# Patient Record
Sex: Female | Born: 1945
Health system: Southern US, Community
[De-identification: ages and names within clinical notes are randomized; demographics above are authoritative.]

## PROBLEM LIST (undated history)

## (undated) DIAGNOSIS — Z923 Personal history of irradiation: Secondary | ICD-10-CM

## (undated) DIAGNOSIS — H409 Unspecified glaucoma: Secondary | ICD-10-CM

## (undated) DIAGNOSIS — G709 Myoneural disorder, unspecified: Secondary | ICD-10-CM

## (undated) DIAGNOSIS — M8580 Other specified disorders of bone density and structure, unspecified site: Secondary | ICD-10-CM

## (undated) DIAGNOSIS — C50919 Malignant neoplasm of unspecified site of unspecified female breast: Secondary | ICD-10-CM

## (undated) DIAGNOSIS — Z9221 Personal history of antineoplastic chemotherapy: Secondary | ICD-10-CM

## (undated) DIAGNOSIS — T7840XA Allergy, unspecified, initial encounter: Secondary | ICD-10-CM

## (undated) DIAGNOSIS — C50411 Malignant neoplasm of upper-outer quadrant of right female breast: Secondary | ICD-10-CM

## (undated) DIAGNOSIS — B019 Varicella without complication: Secondary | ICD-10-CM

## (undated) DIAGNOSIS — R51 Headache: Secondary | ICD-10-CM

## (undated) DIAGNOSIS — I420 Dilated cardiomyopathy: Secondary | ICD-10-CM

## (undated) DIAGNOSIS — I10 Essential (primary) hypertension: Secondary | ICD-10-CM

## (undated) DIAGNOSIS — F419 Anxiety disorder, unspecified: Secondary | ICD-10-CM

## (undated) DIAGNOSIS — L719 Rosacea, unspecified: Secondary | ICD-10-CM

## (undated) DIAGNOSIS — I509 Heart failure, unspecified: Secondary | ICD-10-CM

## (undated) HISTORY — PX: APPENDECTOMY: SHX54

## (undated) HISTORY — DX: Dilated cardiomyopathy: I42.0

## (undated) HISTORY — DX: Rosacea, unspecified: L71.9

## (undated) HISTORY — DX: Malignant neoplasm of upper-outer quadrant of right female breast: C50.411

## (undated) HISTORY — PX: OTHER SURGICAL HISTORY: SHX169

## (undated) HISTORY — DX: Anxiety disorder, unspecified: F41.9

## (undated) HISTORY — DX: Headache: R51

## (undated) HISTORY — DX: Allergy, unspecified, initial encounter: T78.40XA

## (undated) HISTORY — DX: Unspecified glaucoma: H40.9

## (undated) HISTORY — DX: Varicella without complication: B01.9

## (undated) HISTORY — DX: Essential (primary) hypertension: I10

## (undated) HISTORY — PX: EYE SURGERY: SHX253

## (undated) HISTORY — DX: Heart failure, unspecified: I50.9

---

## 1998-06-25 ENCOUNTER — Other Ambulatory Visit: Admission: RE | Admit: 1998-06-25 | Discharge: 1998-06-25 | Payer: Self-pay | Admitting: *Deleted

## 2000-08-23 ENCOUNTER — Other Ambulatory Visit: Admission: RE | Admit: 2000-08-23 | Discharge: 2000-08-23 | Payer: Self-pay | Admitting: *Deleted

## 2004-07-11 ENCOUNTER — Ambulatory Visit: Payer: Self-pay | Admitting: Family Medicine

## 2004-07-14 ENCOUNTER — Encounter: Admission: RE | Admit: 2004-07-14 | Discharge: 2004-07-14 | Payer: Self-pay | Admitting: Obstetrics and Gynecology

## 2005-01-09 ENCOUNTER — Ambulatory Visit: Payer: Self-pay | Admitting: Family Medicine

## 2005-04-19 ENCOUNTER — Ambulatory Visit: Payer: Self-pay | Admitting: Family Medicine

## 2005-08-25 ENCOUNTER — Ambulatory Visit: Payer: Self-pay | Admitting: Family Medicine

## 2005-08-29 ENCOUNTER — Ambulatory Visit: Payer: Self-pay | Admitting: Family Medicine

## 2005-09-29 ENCOUNTER — Ambulatory Visit: Payer: Self-pay | Admitting: Family Medicine

## 2005-11-22 ENCOUNTER — Ambulatory Visit: Payer: Self-pay | Admitting: Family Medicine

## 2006-02-08 ENCOUNTER — Ambulatory Visit: Payer: Self-pay | Admitting: Family Medicine

## 2006-03-15 ENCOUNTER — Ambulatory Visit: Payer: Self-pay | Admitting: Family Medicine

## 2006-08-31 ENCOUNTER — Ambulatory Visit: Payer: Self-pay | Admitting: Family Medicine

## 2006-08-31 LAB — CONVERTED CEMR LAB
Basophils Relative: 0.4 % (ref 0.0–1.0)
Bilirubin, Direct: 0.1 mg/dL (ref 0.0–0.3)
Chloride: 108 meq/L (ref 96–112)
Creatinine, Ser: 0.8 mg/dL (ref 0.4–1.2)
Direct LDL: 136.5 mg/dL
Eosinophils Relative: 2.4 % (ref 0.0–5.0)
Glucose, Bld: 75 mg/dL (ref 70–99)
HCT: 43.1 % (ref 36.0–46.0)
Hemoglobin: 14.6 g/dL (ref 12.0–15.0)
Lymphocytes Relative: 33.1 % (ref 12.0–46.0)
MCHC: 33.8 g/dL (ref 30.0–36.0)
Monocytes Absolute: 0.7 10*3/uL (ref 0.2–0.7)
Neutro Abs: 4.1 10*3/uL (ref 1.4–7.7)
Neutrophils Relative %: 55 % (ref 43.0–77.0)
Platelets: 288 10*3/uL (ref 150–400)
Potassium: 3.9 meq/L (ref 3.5–5.1)
Total CHOL/HDL Ratio: 3.8
Triglycerides: 113 mg/dL (ref 0–149)
VLDL: 23 mg/dL (ref 0–40)

## 2006-09-20 ENCOUNTER — Encounter: Admission: RE | Admit: 2006-09-20 | Discharge: 2006-09-20 | Payer: Self-pay | Admitting: Obstetrics and Gynecology

## 2006-11-13 ENCOUNTER — Ambulatory Visit: Payer: Self-pay | Admitting: Internal Medicine

## 2006-11-20 ENCOUNTER — Telehealth: Payer: Self-pay | Admitting: *Deleted

## 2007-10-16 ENCOUNTER — Ambulatory Visit: Payer: Self-pay | Admitting: Family Medicine

## 2007-10-16 LAB — CONVERTED CEMR LAB
Bilirubin Urine: NEGATIVE
Glucose, Urine, Semiquant: NEGATIVE
Ketones, urine, test strip: NEGATIVE
pH: 5.5

## 2007-10-18 LAB — CONVERTED CEMR LAB
BUN: 10 mg/dL (ref 6–23)
Basophils Absolute: 0.1 10*3/uL (ref 0.0–0.1)
Cholesterol: 225 mg/dL (ref 0–200)
Direct LDL: 142.2 mg/dL
Eosinophils Absolute: 0.2 10*3/uL (ref 0.0–0.7)
GFR calc Af Amer: 94 mL/min
HDL: 62.3 mg/dL (ref >39.0)
MCHC: 34.9 g/dL (ref 30.0–36.0)
Monocytes Absolute: 0.6 10*3/uL (ref 0.1–1.0)
Neutro Abs: 2.9 10*3/uL (ref 1.4–7.7)
Neutrophils Relative %: 49.8 % (ref 43.0–77.0)
Potassium: 4.9 meq/L (ref 3.5–5.1)
RBC: 4.59 M/uL (ref 3.87–5.11)
RDW: 12.5 % (ref 11.5–14.6)
TSH: 2.46 microintl units/mL (ref 0.35–5.50)
Total CHOL/HDL Ratio: 3.6
VLDL: 23 mg/dL (ref 0–40)

## 2007-10-23 ENCOUNTER — Ambulatory Visit: Payer: Self-pay | Admitting: Family Medicine

## 2007-10-23 DIAGNOSIS — R519 Headache, unspecified: Secondary | ICD-10-CM | POA: Insufficient documentation

## 2007-10-23 DIAGNOSIS — M810 Age-related osteoporosis without current pathological fracture: Secondary | ICD-10-CM | POA: Insufficient documentation

## 2007-10-23 DIAGNOSIS — J309 Allergic rhinitis, unspecified: Secondary | ICD-10-CM

## 2007-10-23 DIAGNOSIS — I10 Essential (primary) hypertension: Secondary | ICD-10-CM | POA: Insufficient documentation

## 2007-10-23 DIAGNOSIS — F411 Generalized anxiety disorder: Secondary | ICD-10-CM

## 2007-10-23 DIAGNOSIS — R51 Headache: Secondary | ICD-10-CM

## 2007-10-23 DIAGNOSIS — L719 Rosacea, unspecified: Secondary | ICD-10-CM

## 2009-02-04 ENCOUNTER — Ambulatory Visit: Payer: Self-pay | Admitting: Family Medicine

## 2009-02-04 LAB — CONVERTED CEMR LAB
Blood in Urine, dipstick: NEGATIVE
Ketones, urine, test strip: NEGATIVE
Nitrite: NEGATIVE
Protein, U semiquant: NEGATIVE
Urobilinogen, UA: 0.2

## 2009-02-09 LAB — CONVERTED CEMR LAB
ALT: 17 units/L (ref 0–35)
CO2: 28 meq/L (ref 19–32)
Cholesterol: 195 mg/dL (ref 0–200)
Creatinine, Ser: 0.8 mg/dL (ref 0.4–1.2)
Eosinophils Absolute: 0.2 10*3/uL (ref 0.0–0.7)
Eosinophils Relative: 2.5 % (ref 0.0–5.0)
Glucose, Bld: 90 mg/dL (ref 70–99)
Monocytes Relative: 9.8 % (ref 3.0–12.0)
Neutro Abs: 3.4 10*3/uL (ref 1.4–7.7)
Platelets: 235 10*3/uL (ref 150.0–400.0)
Potassium: 4.3 meq/L (ref 3.5–5.1)
RBC: 4.76 M/uL (ref 3.87–5.11)
Sodium: 136 meq/L (ref 135–145)
TSH: 1.77 microintl units/mL (ref 0.35–5.50)
Total CHOL/HDL Ratio: 3
Total Protein: 7.4 g/dL (ref 6.0–8.3)
VLDL: 18.6 mg/dL (ref 0.0–40.0)
WBC: 6.6 10*3/uL (ref 4.5–10.5)

## 2009-02-16 ENCOUNTER — Ambulatory Visit: Payer: Self-pay | Admitting: Family Medicine

## 2010-02-14 ENCOUNTER — Ambulatory Visit: Payer: Self-pay | Admitting: Family Medicine

## 2010-02-14 LAB — CONVERTED CEMR LAB
Bilirubin Urine: NEGATIVE
Blood in Urine, dipstick: NEGATIVE
Glucose, Urine, Semiquant: NEGATIVE
Ketones, urine, test strip: NEGATIVE
Protein, U semiquant: NEGATIVE
Specific Gravity, Urine: 1.015
Urobilinogen, UA: 0.2
WBC Urine, dipstick: NEGATIVE
pH: 5.5

## 2010-02-17 LAB — CONVERTED CEMR LAB
ALT: 22 units/L (ref 0–35)
AST: 24 units/L (ref 0–37)
Alkaline Phosphatase: 61 units/L (ref 39–117)
Basophils Relative: 0.3 % (ref 0.0–3.0)
CO2: 28 meq/L (ref 19–32)
Calcium: 10 mg/dL (ref 8.4–10.5)
Chloride: 101 meq/L (ref 96–112)
Creatinine, Ser: 0.9 mg/dL (ref 0.4–1.2)
Glucose, Bld: 88 mg/dL (ref 70–99)
Lymphocytes Relative: 33.3 % (ref 12.0–46.0)
Lymphs Abs: 2.8 10*3/uL (ref 0.7–4.0)
Monocytes Relative: 8.3 % (ref 3.0–12.0)
Neutro Abs: 4.7 10*3/uL (ref 1.4–7.7)
Sodium: 138 meq/L (ref 135–145)
TSH: 2.3 microintl units/mL (ref 0.35–5.50)
Total CHOL/HDL Ratio: 3
Total Protein: 7.4 g/dL (ref 6.0–8.3)

## 2010-02-21 ENCOUNTER — Ambulatory Visit: Payer: Self-pay | Admitting: Family Medicine

## 2010-02-21 ENCOUNTER — Encounter: Payer: Self-pay | Admitting: Family Medicine

## 2010-02-22 LAB — CONVERTED CEMR LAB
CO2: 28 meq/L (ref 19–32)
Chloride: 96 meq/L (ref 96–112)
Potassium: 4.1 meq/L (ref 3.5–5.1)

## 2010-05-31 NOTE — Assessment & Plan Note (Signed)
Summary: CPX/CJR   Vital Signs:  Patient profile:   65 year old female Weight:      193 pounds BMI:     31.99 O2 Sat:      98 % Temp:     99.1 degrees F Pulse rate:   111 / minute BP sitting:   130 / 86  (left arm) Cuff size:   large  Vitals Entered By: Pura Spice, RN (February 21, 2010 2:53 PM) CC: cpx no pap    History of Present Illness: 65 yr old female for a cpx. She feels fine but would like for me to treat her acne rosacea. She would like touse generics where possible. She is working out at a gym 5 days a week doing weights, cardio, and yoga.   Allergies (verified): No Known Drug Allergies  Past History:  Past Medical History: Reviewed history from 02/16/2009 and no changes required. Allergic rhinitis Headache Hypertension Chickenpox Rosacea, sees Dr. Terri Piedra sees Dr. Henderson Cloud for gyn exams Osteoporosis per DEXA 2006, improved by DEXA in 2008, managed by Dr. Demaris Callander  Past Surgical History: Reviewed history from 10/23/2007 and no changes required.  Appendectomy Cataract extractions, bilateral, per Dr. Jettie Pagan colonoscopy 09-21-05 per Dr. Kinnie Scales, repeat in 3 yrs  Past History:  Care Management: Gynecology: Dr Margarite Gouge   Family History: Reviewed history from 10/23/2007 and no changes required. Family History of Arthritis Family History of CAD Female 1st degree relative <50 Family History Hypertension Family History of Stroke M 1st degree relative <50 Macular degeneration  Social History: Reviewed history from 10/23/2007 and no changes required. Married Former Smoker Alcohol use-yes Drug use-no  Review of Systems  The patient denies anorexia, fever, weight loss, weight gain, vision loss, decreased hearing, hoarseness, chest pain, syncope, dyspnea on exertion, peripheral edema, prolonged cough, headaches, hemoptysis, abdominal pain, melena, hematochezia, severe indigestion/heartburn, hematuria, incontinence, genital sores, muscle weakness,  suspicious skin lesions, transient blindness, difficulty walking, depression, unusual weight change, abnormal bleeding, enlarged lymph nodes, angioedema, breast masses, and testicular masses.         Flu Vaccine Consent Questions     Do you have a history of severe allergic reactions to this vaccine? no    Any prior history of allergic reactions to egg and/or gelatin? no    Do you have a sensitivity to the preservative Thimersol? no    Do you have a past history of Guillan-Barre Syndrome? no    Do you currently have an acute febrile illness? no    Have you ever had a severe reaction to latex? no    Vaccine information given and explained to patient? yes    Are you currently pregnant? no    Lot Number:[id number]   Exp Date:10/29/2010   Site Given  Left Deltoid IM Pura Spice, RN  February 21, 2010 3:10 PM   Physical Exam  General:  overweight-appearing.   Head:  Normocephalic and atraumatic without obvious abnormalities. No apparent alopecia or balding. Eyes:  No corneal or conjunctival inflammation noted. EOMI. Perrla. Funduscopic exam benign, without hemorrhages, exudates or papilledema. Vision grossly normal. Ears:  External ear exam shows no significant lesions or deformities.  Otoscopic examination reveals clear canals, tympanic membranes are intact bilaterally without bulging, retraction, inflammation or discharge. Hearing is grossly normal bilaterally. Nose:  External nasal examination shows no deformity or inflammation. Nasal mucosa are pink and moist without lesions or exudates. Mouth:  Oral mucosa and oropharynx without lesions or exudates.  Teeth in good repair.  Neck:  No deformities, masses, or tenderness noted. Chest Wall:  No deformities, masses, or tenderness noted. Lungs:  Normal respiratory effort, chest expands symmetrically. Lungs are clear to auscultation, no crackles or wheezes. Heart:  Normal rate and regular rhythm. S1 and S2 normal without gallop, murmur, click, rub  or other extra sounds. EKG normal Abdomen:  Bowel sounds positive,abdomen soft and non-tender without masses, organomegaly or hernias noted. Msk:  No deformity or scoliosis noted of thoracic or lumbar spine.   Pulses:  R and L carotid,radial,femoral,dorsalis pedis and posterior tibial pulses are full and equal bilaterally Extremities:  No clubbing, cyanosis, edema, or deformity noted with normal full range of motion of all joints.   Neurologic:  No cranial nerve deficits noted. Station and gait are normal. Plantar reflexes are down-going bilaterally. DTRs are symmetrical throughout. Sensory, motor and coordinative functions appear intact. Skin:  Intact without suspicious lesions or rashes Cervical Nodes:  No lymphadenopathy noted Axillary Nodes:  No palpable lymphadenopathy Inguinal Nodes:  No significant adenopathy Psych:  Cognition and judgment appear intact. Alert and cooperative with normal attention span and concentration. No apparent delusions, illusions, hallucinations   Impression & Recommendations:  Problem # 1:  PHYSICAL EXAMINATION (ICD-V70.0)  Orders: EKG w/ Interpretation (93000)  Complete Medication List: 1)  Flonase 50 Mcg/act Susp (Fluticasone propionate) .... 2 sprays each nostril once daily 2)  Zyrtec Allergy 10 Mg Tabs (Cetirizine hcl) .Marland Kitchen.. 1 once daily prn 3)  Calcium Carbonate-vitamin D 600-400 Mg-unit Tabs (Calcium carbonate-vitamin d) .Marland Kitchen.. 1 by mouth once daily 4)  Vitamin C 500 Mg Tabs (Ascorbic acid) .Marland Kitchen.. 1 by mouth once daily 5)  Fish Oil Oil (Fish oil) .Marland Kitchen.. 1 by mouth once daily 6)  Fosamax 70 Mg Tabs (Alendronate sodium) .Marland Kitchen.. 1 by mouth every week 7)  Reglan 10 Mg Tabs (Metoclopramide hcl) .Marland Kitchen.. 1 every 6 hours as needed hiccups 8)  Klonopin 2 Mg Tabs (Clonazepam) .... Two times a day prn  anxiety 9)  Flexeril 10 Mg Tabs (Cyclobenzaprine hcl) .... Three times a day as needed spasm 10)  Lisinopril 10 Mg Tabs (Lisinopril) .... Once daily 11)  Metronidazole 0.75  % Gel (Metronidazole) .... Apply two times a day  Other Orders: Admin 1st Vaccine (10272) Flu Vaccine 40yrs + (53664) TLB-BMP (Basic Metabolic Panel-BMET) (80048-METABOL) Venipuncture (40347) Specimen Handling (42595)  Patient Instructions: 1)  Please schedule a follow-up appointment in 6 months .  2)  It is important that you exercise reguarly at least 20 minutes 5 times a week. If you develop chest pain, have severe difficulty breathing, or feel very tired, stop exercising immediately and seek medical attention.  3)  You need to lose weight. Consider a lower calorie diet and regular exercise.  Prescriptions: LISINOPRIL 10 MG TABS (LISINOPRIL) once daily  #90 x 3   Entered and Authorized by:   Nelwyn Salisbury MD   Signed by:   Nelwyn Salisbury MD on 02/21/2010   Method used:   Print then Give to Patient   RxID:   [id number] FLEXERIL 10 MG TABS (CYCLOBENZAPRINE HCL) three times a day as needed spasm  #270 x 1   Entered and Authorized by:   Nelwyn Salisbury MD   Signed by:   Nelwyn Salisbury MD on 02/21/2010   Method used:   Print then Give to Patient   RxID:   [id number] KLONOPIN 2 MG TABS (CLONAZEPAM) two times a day prn  anxiety  #180 x 1   Entered and  Authorized by:   Nelwyn Salisbury MD   Signed by:   Nelwyn Salisbury MD on 02/21/2010   Method used:   Print then Give to Patient   RxID:   [id number] REGLAN 10 MG  TABS (METOCLOPRAMIDE HCL) 1 every 6 hours as needed hiccups  #360 x 1   Entered and Authorized by:   Nelwyn Salisbury MD   Signed by:   Nelwyn Salisbury MD on 02/21/2010   Method used:   Print then Give to Patient   RxID:   [id number] FLONASE 50 MCG/ACT  SUSP (FLUTICASONE PROPIONATE) 2 sprays each nostril once daily  #90 x 3   Entered and Authorized by:   Nelwyn Salisbury MD   Signed by:   Nelwyn Salisbury MD on 02/21/2010   Method used:   Print then Give to Patient   RxID:   [id number] METRONIDAZOLE 0.75 % GEL (METRONIDAZOLE) apply two times a day  #90 x 3    Entered and Authorized by:   Nelwyn Salisbury MD   Signed by:   Nelwyn Salisbury MD on 02/21/2010   Method used:   Print then Give to Patient   RxID:   [id number]    Orders Added: 1)  Admin 1st Vaccine [90471] 2)  Flu Vaccine 31yrs + [35009] 3)  TLB-BMP (Basic Metabolic Panel-BMET) [80048-METABOL] 4)  Venipuncture [38182] 5)  Est. Patient 40-64 years [99396] 6)  EKG w/ Interpretation [93000] 7)  Specimen Handling [99000]

## 2010-07-04 ENCOUNTER — Telehealth: Payer: Self-pay | Admitting: *Deleted

## 2010-07-04 NOTE — Telephone Encounter (Signed)
Call in Klor-con 10 mEq daily, #30 with 11 rf. Also I suggest she drink a glass of tonic water with quinine daily

## 2010-07-04 NOTE — Telephone Encounter (Signed)
Pt is having severe leg and foot cramps q hs, and wants to take Magnesium and Potassium. Would like Dr. Clent Ridges to write a prescription for the Potassium.  She is on Vitamin D and Calcium.

## 2010-07-06 ENCOUNTER — Telehealth: Payer: Self-pay | Admitting: Family Medicine

## 2010-07-06 NOTE — Telephone Encounter (Signed)
Pt has been exercising a lot this year she is experiencing a lot of leg cramps sometime leg cramps 5 to 10 times a night. Pt would like a rx for potassium to go along with calcium and magnesium. [place] also [place] [phone]

## 2010-07-06 NOTE — Telephone Encounter (Signed)
I gave an order for her to start on potasium pills 2 days ago. See a phone note from 07-04-10

## 2010-07-06 NOTE — Telephone Encounter (Signed)
Tried to call.  No answering machine.

## 2010-07-07 MED ORDER — POTASSIUM CHLORIDE CRYS ER 20 MEQ PO TBCR: 20.0000 meq | EXTENDED_RELEASE_TABLET | Freq: Every day | ORAL | Status: AC

## 2010-07-07 NOTE — Telephone Encounter (Signed)
patient  Is aware and rx sent 

## 2010-08-25 ENCOUNTER — Ambulatory Visit (INDEPENDENT_AMBULATORY_CARE_PROVIDER_SITE_OTHER): Payer: BC Managed Care – PPO | Admitting: Family Medicine

## 2010-08-25 ENCOUNTER — Encounter: Payer: Self-pay | Admitting: Family Medicine

## 2010-08-25 VITALS — BP 122/76 | HR 88 | Temp 98.0°F | Resp 16 | Wt 183.0 lb

## 2010-08-25 DIAGNOSIS — J329 Chronic sinusitis, unspecified: Secondary | ICD-10-CM

## 2010-08-25 MED ORDER — METHYLPREDNISOLONE ACETATE 80 MG/ML IJ SUSP
80.0000 mg | Freq: Once | INTRAMUSCULAR | Status: AC
  Administered 2010-08-25: 120 mg via INTRAMUSCULAR

## 2010-08-25 MED ORDER — AZITHROMYCIN 250 MG PO TABS: 250.0000 mg | ORAL_TABLET | Freq: Every day | ORAL | Status: AC

## 2010-08-25 MED ORDER — HYDROCODONE-HOMATROPINE 5-1.5 MG/5ML PO SYRP: 5.0000 mL | ORAL_SOLUTION | ORAL | Status: AC | PRN

## 2010-08-25 NOTE — Progress Notes (Signed)
  Subjective:    Patient ID: Stephanie Olsen, female    DOB: [birth year], 65 y.o.   MRN: [id number]  HPI Here for 3 weeks of sinus pressure, PND, HA, ST, and a dry cough. No fever. On Zyrtec and Mucinex D.    Review of Systems  Constitutional: Negative.   HENT: Positive for congestion, postnasal drip and sinus pressure.   Eyes: Negative.   Respiratory: Positive for cough.        Objective:   Physical Exam  Constitutional: She appears well-developed and well-nourished.  HENT:  Right Ear: External ear normal.  Left Ear: External ear normal.  Nose: Nose normal.  Mouth/Throat: Oropharynx is clear and moist. No oropharyngeal exudate.  Eyes: Conjunctivae are normal. Pupils are equal, round, and reactive to light.  Neck: No thyromegaly present.  Pulmonary/Chest: Effort normal and breath sounds normal.  Lymphadenopathy:    She has no cervical adenopathy.          Assessment & Plan:  Rest, fluids

## 2010-09-16 NOTE — Assessment & Plan Note (Signed)
[facility]                            [facility] OFFICE NOTE   NAME:Stephanie Olsen, Stephanie Olsen                    MRN:          [id number]  DATE:08/31/2006                            DOB:          October 20, 1945    This is a 66 year old woman here for a nongynecological physical  examination.  She feels fine and has no complaints at all.  She  continues to see Dr. Henderson Cloud for Gynecology care and for care of her  osteoporosis.  Of note she had a colonoscopy in May of 2007 which  revealed only a benign rectal polyp.  A 3 year follow up was recommended  at that time by Dr. Kinnie Scales.  Otherwise her blood pressure has been  stable since we adjusted her medications at our last visit.  Further  details of her past medical history, family history, social history,  habits, etc., refer to our last physical exam dated Aug 29, 2005.   ALLERGIES:  NONE.   CURRENT MEDICATIONS:  1. Calcium and vitamin D daily.  2. Aspirin 81 mg daily.  3. Metrogel daily per Dr. Terri Piedra.  4. Fish oil daily.  5. Fosamax plus D 70 mg once a week.  6. Claritin 10 mg daily.  7. Diovan 160 mg daily.  8. Flonase sprays daily.  9. Skelaxin 800 mg q.i.d. as needed.  10.Aleve as needed.   OBJECTIVE:  Height 5 foot 5 inches, weight 202, blood pressure 132/84,  pulse 76 and regular.  GENERAL:  She remains overweight.  SKIN:  Clear.  EARS:  Clear.  EYES:  Clear.  OROPHARYNX:  Clear.  NECK:  Supple without lymphadenopathy or masses.  LUNGS:  Clear.  CARDIAC:  Rate and rhythm are regular without gallops, murmurs, or rubs.  Distal pulses are full.  EKG is within normal limits.  ABDOMEN:  Soft, normal bowel sounds, nontender, no masses.  EXTREMITIES:  No clubbing, cyanosis, or edema.  NEUROLOGIC:  Grossly intact.   ASSESSMENT/PLAN:  1. Complete physical.  She is fasting so we will get the usual      laboratories, also recommended increasing exercise.  2. Hypertension, stable.  3. Allergies,  stable.  4. Anxiety, stable.  5. Osteoporosis per Dr. Henderson Cloud.     Tera Mater. Clent Ridges, MD  Electronically Signed    SAF/MedQ  DD: 08/31/2006  DT: 08/31/2006  Job #: [id number]

## 2010-11-30 ENCOUNTER — Other Ambulatory Visit: Payer: Self-pay | Admitting: Family Medicine

## 2010-11-30 MED ORDER — CLONAZEPAM 2 MG PO TABS: 2.0000 mg | ORAL_TABLET | Freq: Two times a day (BID) | ORAL | Status: DC | PRN

## 2010-11-30 NOTE — Telephone Encounter (Signed)
done

## 2010-11-30 NOTE — Telephone Encounter (Signed)
Refill request from [facility]. Pt last here on 08/25/10 and script last filled on 02/21/10.

## 2010-11-30 NOTE — Telephone Encounter (Signed)
Call in #60 with 5 rf 

## 2011-10-26 ENCOUNTER — Encounter: Payer: Self-pay | Admitting: Family Medicine

## 2011-10-26 ENCOUNTER — Ambulatory Visit (INDEPENDENT_AMBULATORY_CARE_PROVIDER_SITE_OTHER): Payer: BC Managed Care – PPO | Admitting: Family Medicine

## 2011-10-26 VITALS — BP 120/76 | HR 83 | Temp 99.6°F | Wt 198.0 lb

## 2011-10-26 DIAGNOSIS — J329 Chronic sinusitis, unspecified: Secondary | ICD-10-CM

## 2011-10-26 MED ORDER — CYCLOBENZAPRINE HCL 10 MG PO TABS: 10.0000 mg | ORAL_TABLET | Freq: Three times a day (TID) | ORAL | Status: DC | PRN

## 2011-10-26 MED ORDER — CLONAZEPAM 2 MG PO TABS: 2.0000 mg | ORAL_TABLET | Freq: Two times a day (BID) | ORAL | Status: DC | PRN

## 2011-10-26 MED ORDER — FLUTICASONE PROPIONATE 50 MCG/ACT NA SUSP: 2.0000 | Freq: Every day | NASAL | Status: DC

## 2011-10-26 MED ORDER — METHYLPREDNISOLONE 4 MG PO KIT: PACK | ORAL | Status: AC

## 2011-10-26 MED ORDER — CEPHALEXIN 500 MG PO CAPS: 500.0000 mg | ORAL_CAPSULE | Freq: Three times a day (TID) | ORAL | Status: AC

## 2011-10-26 MED ORDER — LISINOPRIL 10 MG PO TABS: 10.0000 mg | ORAL_TABLET | Freq: Every day | ORAL | Status: DC

## 2011-10-26 NOTE — Progress Notes (Signed)
  Subjective:    Patient ID: Stephanie Olsen, female    DOB: 27-Oct-1945, 66 y.o.   MRN: [id number]  HPI Here for 5 days of pain in the right facial area, right ear pain, HA, PND, ST, and a dry cough. She has had some fevers.    Review of Systems  Constitutional: Positive for fever.  HENT: Positive for ear pain, congestion, postnasal drip and sinus pressure.   Eyes: Negative.   Respiratory: Positive for cough.        Objective:   Physical Exam  Constitutional: She appears well-developed and well-nourished.  HENT:  Right Ear: External ear normal.  Left Ear: External ear normal.  Nose: Nose normal.  Mouth/Throat: Oropharynx is clear and moist. No oropharyngeal exudate.  Eyes: Conjunctivae are normal.  Pulmonary/Chest: Effort normal and breath sounds normal.  Lymphadenopathy:    She has no cervical adenopathy.          Assessment & Plan:  Use Mucinex and Delsym.

## 2012-06-04 DIAGNOSIS — H40019 Open angle with borderline findings, low risk, unspecified eye: Secondary | ICD-10-CM | POA: Diagnosis not present

## 2012-06-04 DIAGNOSIS — H52209 Unspecified astigmatism, unspecified eye: Secondary | ICD-10-CM | POA: Diagnosis not present

## 2012-06-04 DIAGNOSIS — H3530 Unspecified macular degeneration: Secondary | ICD-10-CM | POA: Diagnosis not present

## 2012-06-04 DIAGNOSIS — H43819 Vitreous degeneration, unspecified eye: Secondary | ICD-10-CM | POA: Diagnosis not present

## 2012-06-18 DIAGNOSIS — Z01419 Encounter for gynecological examination (general) (routine) without abnormal findings: Secondary | ICD-10-CM | POA: Diagnosis not present

## 2012-06-18 DIAGNOSIS — Z1231 Encounter for screening mammogram for malignant neoplasm of breast: Secondary | ICD-10-CM | POA: Diagnosis not present

## 2012-06-18 DIAGNOSIS — Z1289 Encounter for screening for malignant neoplasm of other sites: Secondary | ICD-10-CM | POA: Diagnosis not present

## 2012-06-19 DIAGNOSIS — L719 Rosacea, unspecified: Secondary | ICD-10-CM | POA: Diagnosis not present

## 2012-07-30 DIAGNOSIS — H4011X0 Primary open-angle glaucoma, stage unspecified: Secondary | ICD-10-CM | POA: Diagnosis not present

## 2012-07-30 DIAGNOSIS — H409 Unspecified glaucoma: Secondary | ICD-10-CM | POA: Diagnosis not present

## 2012-09-03 DIAGNOSIS — H4011X0 Primary open-angle glaucoma, stage unspecified: Secondary | ICD-10-CM | POA: Diagnosis not present

## 2012-12-31 DIAGNOSIS — H409 Unspecified glaucoma: Secondary | ICD-10-CM | POA: Diagnosis not present

## 2012-12-31 DIAGNOSIS — H4011X0 Primary open-angle glaucoma, stage unspecified: Secondary | ICD-10-CM | POA: Diagnosis not present

## 2013-01-03 ENCOUNTER — Telehealth: Payer: Self-pay | Admitting: Family Medicine

## 2013-01-03 MED ORDER — LISINOPRIL 10 MG PO TABS: 10.0000 mg | ORAL_TABLET | Freq: Every day | ORAL | Status: DC

## 2013-01-03 NOTE — Telephone Encounter (Signed)
I sent script e-scribe and spoke with pt, she will keep her appointment.

## 2013-01-03 NOTE — Telephone Encounter (Signed)
Pt needs refill on lisinopril call into [facility]. Pt has appt on 01-06-13. Pt will be out of med on saturday

## 2013-01-06 ENCOUNTER — Ambulatory Visit (INDEPENDENT_AMBULATORY_CARE_PROVIDER_SITE_OTHER): Payer: Medicare Other | Admitting: Family Medicine

## 2013-01-06 ENCOUNTER — Encounter: Payer: Self-pay | Admitting: Family Medicine

## 2013-01-06 VITALS — BP 128/82 | HR 106 | Temp 98.3°F | Wt 197.0 lb

## 2013-01-06 DIAGNOSIS — I10 Essential (primary) hypertension: Secondary | ICD-10-CM

## 2013-01-06 LAB — POCT URINALYSIS DIPSTICK
Bilirubin, UA: NEGATIVE
Glucose, UA: NEGATIVE
Urobilinogen, UA: 0.2
pH, UA: 7

## 2013-01-06 LAB — TSH: TSH: 1.5 u[IU]/mL (ref 0.35–5.50)

## 2013-01-06 LAB — BASIC METABOLIC PANEL
CO2: 28 mEq/L (ref 19–32)
Chloride: 103 mEq/L (ref 96–112)
GFR: 81.01 mL/min (ref >60.00)
Potassium: 5.2 mEq/L — ABNORMAL HIGH (ref 3.5–5.1)
Sodium: 136 mEq/L (ref 135–145)

## 2013-01-06 LAB — CBC WITH DIFFERENTIAL/PLATELET
Basophils Absolute: 0 10*3/uL (ref 0.0–0.1)
Basophils Relative: 0.3 % (ref 0.0–3.0)
Eosinophils Relative: 3.3 % (ref 0.0–5.0)
HCT: 41.3 % (ref 36.0–46.0)
Hemoglobin: 13.9 g/dL (ref 12.0–15.0)
Lymphocytes Relative: 36.6 % (ref 12.0–46.0)
MCHC: 33.8 g/dL (ref 30.0–36.0)
MCV: 87.5 fl (ref 78.0–100.0)
Monocytes Relative: 9.9 % (ref 3.0–12.0)
Platelets: 290 10*3/uL (ref 150.0–400.0)
RBC: 4.73 Mil/uL (ref 3.87–5.11)
RDW: 13.3 % (ref 11.5–14.6)
WBC: 8.6 10*3/uL (ref 4.5–10.5)

## 2013-01-06 LAB — HEPATIC FUNCTION PANEL
ALT: 21 U/L (ref 0–35)
Albumin: 4 g/dL (ref 3.5–5.2)
Total Bilirubin: 0.6 mg/dL (ref 0.3–1.2)

## 2013-01-06 MED ORDER — LISINOPRIL 10 MG PO TABS: 10.0000 mg | ORAL_TABLET | Freq: Every day | ORAL | Status: DC

## 2013-01-06 MED ORDER — FLUTICASONE PROPIONATE 50 MCG/ACT NA SUSP: 2.0000 | Freq: Every day | NASAL | Status: DC

## 2013-01-06 NOTE — Progress Notes (Signed)
  Subjective:    Patient ID: Stephanie Olsen, female    DOB: 03/31/1946, 67 y.o.   MRN: [id number]  HPI Here for follow up. She feels well and her BP is stable. She has been under some stress since her husband has been undergoing treatment for multiple myeloma. She goes to yoga 3 days a week.    Review of Systems  Constitutional: Negative.   Respiratory: Negative.   Cardiovascular: Negative.        Objective:   Physical Exam  Constitutional: She appears well-developed and well-nourished.  Cardiovascular: Normal rate, regular rhythm, normal heart sounds and intact distal pulses.   Pulmonary/Chest: Effort normal and breath sounds normal.          Assessment & Plan:  Get labs today. She will set up a cpx soon.

## 2013-01-06 NOTE — Progress Notes (Signed)
Quick Note:  I left voice message with results. ______ 

## 2013-01-07 ENCOUNTER — Telehealth: Payer: Self-pay | Admitting: Family Medicine

## 2013-01-07 NOTE — Telephone Encounter (Signed)
The labs are not back yet for me to look at

## 2013-01-07 NOTE — Telephone Encounter (Signed)
Pt cannot get into office to get lab results, but would like to know if you could mail a copy of her labs done 9/8 to her.

## 2013-01-07 NOTE — Telephone Encounter (Signed)
I spoke with pt and put a copy of results in mail, per pt request. 

## 2013-02-04 DIAGNOSIS — H4011X0 Primary open-angle glaucoma, stage unspecified: Secondary | ICD-10-CM | POA: Diagnosis not present

## 2013-02-04 DIAGNOSIS — H409 Unspecified glaucoma: Secondary | ICD-10-CM | POA: Diagnosis not present

## 2013-02-25 ENCOUNTER — Encounter: Payer: Self-pay | Admitting: Family Medicine

## 2013-02-25 ENCOUNTER — Ambulatory Visit (INDEPENDENT_AMBULATORY_CARE_PROVIDER_SITE_OTHER): Payer: Medicare Other | Admitting: Family Medicine

## 2013-02-25 VITALS — BP 114/68 | HR 84 | Temp 98.1°F | Wt 196.0 lb

## 2013-02-25 DIAGNOSIS — M810 Age-related osteoporosis without current pathological fracture: Secondary | ICD-10-CM | POA: Diagnosis not present

## 2013-02-25 DIAGNOSIS — H409 Unspecified glaucoma: Secondary | ICD-10-CM | POA: Diagnosis not present

## 2013-02-25 DIAGNOSIS — F411 Generalized anxiety disorder: Secondary | ICD-10-CM

## 2013-02-25 DIAGNOSIS — Z23 Encounter for immunization: Secondary | ICD-10-CM | POA: Diagnosis not present

## 2013-02-25 DIAGNOSIS — I10 Essential (primary) hypertension: Secondary | ICD-10-CM

## 2013-02-25 LAB — LIPID PANEL
HDL: 59.6 mg/dL (ref >39.00)
Total CHOL/HDL Ratio: 4
Triglycerides: 166 mg/dL — ABNORMAL HIGH (ref 0.0–149.0)

## 2013-02-25 LAB — LDL CHOLESTEROL, DIRECT: Direct LDL: 137.3 mg/dL

## 2013-02-25 MED ORDER — CYCLOBENZAPRINE HCL 10 MG PO TABS: 10.0000 mg | ORAL_TABLET | Freq: Three times a day (TID) | ORAL | Status: DC | PRN

## 2013-02-25 MED ORDER — METOCLOPRAMIDE HCL 10 MG PO TABS: 10.0000 mg | ORAL_TABLET | Freq: Four times a day (QID) | ORAL | Status: DC | PRN

## 2013-02-25 MED ORDER — CLONAZEPAM 2 MG PO TABS: 2.0000 mg | ORAL_TABLET | Freq: Two times a day (BID) | ORAL | Status: DC | PRN

## 2013-02-25 NOTE — Progress Notes (Signed)
  Subjective:    Patient ID: SHARLISA HOLLIFIELD, female    DOB: 1945-12-25, 67 y.o.   MRN: [id number]  HPI 67 yr old female for a cpx. She feels well.    Review of Systems  Constitutional: Negative.   HENT: Negative.   Eyes: Negative.   Respiratory: Negative.   Cardiovascular: Negative.   Gastrointestinal: Negative.   Genitourinary: Negative for dysuria, urgency, frequency, hematuria, flank pain, decreased urine volume, enuresis, difficulty urinating, pelvic pain and dyspareunia.  Musculoskeletal: Negative.   Skin: Negative.   Neurological: Negative.   Psychiatric/Behavioral: Negative.        Objective:   Physical Exam  Constitutional: She is oriented to person, place, and time. She appears well-developed and well-nourished. No distress.  HENT:  Head: Normocephalic and atraumatic.  Right Ear: External ear normal.  Left Ear: External ear normal.  Nose: Nose normal.  Mouth/Throat: Oropharynx is clear and moist. No oropharyngeal exudate.  Eyes: Conjunctivae and EOM are normal. Pupils are equal, round, and reactive to light. No scleral icterus.  Neck: Normal range of motion. Neck supple. No JVD present. No thyromegaly present.  Cardiovascular: Normal rate, regular rhythm, normal heart sounds and intact distal pulses.  Exam reveals no gallop and no friction rub.   No murmur heard. EKG normal   Pulmonary/Chest: Effort normal and breath sounds normal. No respiratory distress. She has no wheezes. She has no rales. She exhibits no tenderness.  Abdominal: Soft. Bowel sounds are normal. She exhibits no distension and no mass. There is no tenderness. There is no rebound and no guarding.  Musculoskeletal: Normal range of motion. She exhibits no edema and no tenderness.  Lymphadenopathy:    She has no cervical adenopathy.  Neurological: She is alert and oriented to person, place, and time. She has normal reflexes. No cranial nerve deficit. She exhibits normal muscle tone. Coordination normal.   Skin: Skin is warm and dry. No rash noted. No erythema.  Psychiatric: She has a normal mood and affect. Her behavior is normal. Judgment and thought content normal.          Assessment & Plan:  Well exam. Get fasting labs.

## 2013-02-28 NOTE — Progress Notes (Signed)
Quick Note:  I left a voice message with results. ______

## 2013-06-06 DIAGNOSIS — H409 Unspecified glaucoma: Secondary | ICD-10-CM | POA: Diagnosis not present

## 2013-06-06 DIAGNOSIS — H3530 Unspecified macular degeneration: Secondary | ICD-10-CM | POA: Diagnosis not present

## 2013-06-06 DIAGNOSIS — H4011X0 Primary open-angle glaucoma, stage unspecified: Secondary | ICD-10-CM | POA: Diagnosis not present

## 2013-06-06 DIAGNOSIS — Z961 Presence of intraocular lens: Secondary | ICD-10-CM | POA: Diagnosis not present

## 2013-07-04 ENCOUNTER — Other Ambulatory Visit: Payer: Self-pay | Admitting: Obstetrics and Gynecology

## 2013-07-04 DIAGNOSIS — Z01419 Encounter for gynecological examination (general) (routine) without abnormal findings: Secondary | ICD-10-CM | POA: Diagnosis not present

## 2013-07-04 DIAGNOSIS — Z1231 Encounter for screening mammogram for malignant neoplasm of breast: Secondary | ICD-10-CM | POA: Diagnosis not present

## 2013-07-04 DIAGNOSIS — R350 Frequency of micturition: Secondary | ICD-10-CM | POA: Diagnosis not present

## 2013-07-11 DIAGNOSIS — Z1211 Encounter for screening for malignant neoplasm of colon: Secondary | ICD-10-CM | POA: Diagnosis not present

## 2013-11-03 DIAGNOSIS — Z87440 Personal history of urinary (tract) infections: Secondary | ICD-10-CM | POA: Diagnosis not present

## 2013-12-05 DIAGNOSIS — H4011X0 Primary open-angle glaucoma, stage unspecified: Secondary | ICD-10-CM | POA: Diagnosis not present

## 2013-12-05 DIAGNOSIS — H409 Unspecified glaucoma: Secondary | ICD-10-CM | POA: Diagnosis not present

## 2013-12-31 ENCOUNTER — Telehealth: Payer: Self-pay | Admitting: Family Medicine

## 2013-12-31 NOTE — Telephone Encounter (Signed)
She should call to make a morning appt for a "physical" but be fasting so we can draw labs that same day

## 2013-12-31 NOTE — Telephone Encounter (Signed)
See previous note

## 2013-12-31 NOTE — Telephone Encounter (Signed)
Pt states that dr. Sarajane Jews informed her to call us and state that she need a physical but dr. Sarajane Jews will code it as something else so that medicare will pay for it, and also to get labs done first and then see him. Need order for labs and need directions on how to schedule the appt.

## 2014-02-04 DIAGNOSIS — H4011X2 Primary open-angle glaucoma, moderate stage: Secondary | ICD-10-CM | POA: Diagnosis not present

## 2014-02-12 ENCOUNTER — Encounter: Payer: Self-pay | Admitting: Family Medicine

## 2014-02-12 ENCOUNTER — Ambulatory Visit (INDEPENDENT_AMBULATORY_CARE_PROVIDER_SITE_OTHER): Payer: Medicare Other | Admitting: Family Medicine

## 2014-02-12 VITALS — BP 133/84 | HR 68 | Temp 98.4°F | Ht 65.25 in | Wt 201.0 lb

## 2014-02-12 DIAGNOSIS — Z23 Encounter for immunization: Secondary | ICD-10-CM

## 2014-02-12 DIAGNOSIS — M810 Age-related osteoporosis without current pathological fracture: Secondary | ICD-10-CM

## 2014-02-12 DIAGNOSIS — R5383 Other fatigue: Secondary | ICD-10-CM

## 2014-02-12 DIAGNOSIS — I10 Essential (primary) hypertension: Secondary | ICD-10-CM | POA: Diagnosis not present

## 2014-02-12 DIAGNOSIS — F411 Generalized anxiety disorder: Secondary | ICD-10-CM | POA: Diagnosis not present

## 2014-02-12 DIAGNOSIS — Z79899 Other long term (current) drug therapy: Secondary | ICD-10-CM | POA: Diagnosis not present

## 2014-02-12 DIAGNOSIS — G44019 Episodic cluster headache, not intractable: Secondary | ICD-10-CM

## 2014-02-12 LAB — CBC WITH DIFFERENTIAL/PLATELET
Basophils Absolute: 0 10*3/uL (ref 0.0–0.1)
Basophils Relative: 0.4 % (ref 0.0–3.0)
EOS ABS: 0.3 10*3/uL (ref 0.0–0.7)
Eosinophils Relative: 3.7 % (ref 0.0–5.0)
HEMATOCRIT: 43.8 % (ref 36.0–46.0)
Hemoglobin: 14.4 g/dL (ref 12.0–15.0)
Lymphocytes Relative: 38.8 % (ref 12.0–46.0)
Lymphs Abs: 3.3 10*3/uL (ref 0.7–4.0)
MCHC: 32.9 g/dL (ref 30.0–36.0)
MCV: 87.9 fl (ref 78.0–100.0)
MONO ABS: 0.7 10*3/uL (ref 0.1–1.0)
Monocytes Relative: 8.5 % (ref 3.0–12.0)
NEUTROS PCT: 48.6 % (ref 43.0–77.0)
Neutro Abs: 4.1 10*3/uL (ref 1.4–7.7)
Platelets: 280 10*3/uL (ref 150.0–400.0)
RBC: 4.98 Mil/uL (ref 3.87–5.11)
RDW: 13.3 % (ref 11.5–15.5)
WBC: 8.4 10*3/uL (ref 4.0–10.5)

## 2014-02-12 LAB — POCT URINALYSIS DIPSTICK
Bilirubin, UA: NEGATIVE
Glucose, UA: NEGATIVE
Ketones, UA: NEGATIVE
NITRITE UA: NEGATIVE
PH UA: 7.5
Protein, UA: NEGATIVE
RBC UA: NEGATIVE
SPEC GRAV UA: 1.01
UROBILINOGEN UA: 0.2

## 2014-02-12 LAB — BASIC METABOLIC PANEL
BUN: 10 mg/dL (ref 6–23)
CHLORIDE: 100 meq/L (ref 96–112)
CO2: 25 meq/L (ref 19–32)
Calcium: 9.5 mg/dL (ref 8.4–10.5)
Creatinine, Ser: 0.8 mg/dL (ref 0.4–1.2)
GFR: 77.21 mL/min (ref >60.00)
GLUCOSE: 85 mg/dL (ref 70–99)
Potassium: 4.7 mEq/L (ref 3.5–5.1)
SODIUM: 134 meq/L — AB (ref 135–145)

## 2014-02-12 LAB — HEPATIC FUNCTION PANEL
ALK PHOS: 71 U/L (ref 39–117)
ALT: 22 U/L (ref 0–35)
AST: 22 U/L (ref 0–37)
Albumin: 3.6 g/dL (ref 3.5–5.2)
BILIRUBIN DIRECT: 0 mg/dL (ref 0.0–0.3)
TOTAL PROTEIN: 7.9 g/dL (ref 6.0–8.3)
Total Bilirubin: 0.5 mg/dL (ref 0.2–1.2)

## 2014-02-12 LAB — LIPID PANEL
Cholesterol: 214 mg/dL — ABNORMAL HIGH (ref 0–200)
HDL: 50.3 mg/dL (ref >39.00)
LDL Cholesterol: 138 mg/dL — ABNORMAL HIGH (ref 0–99)
NONHDL: 163.7
Total CHOL/HDL Ratio: 4
Triglycerides: 127 mg/dL (ref 0.0–149.0)
VLDL: 25.4 mg/dL (ref 0.0–40.0)

## 2014-02-12 LAB — TSH: TSH: 0.65 u[IU]/mL (ref 0.35–4.50)

## 2014-02-12 LAB — VITAMIN B12: Vitamin B-12: 327 pg/mL (ref 211–911)

## 2014-02-12 MED ORDER — LISINOPRIL 10 MG PO TABS: 10.0000 mg | ORAL_TABLET | Freq: Every day | ORAL | Status: DC

## 2014-02-12 MED ORDER — METOCLOPRAMIDE HCL 10 MG PO TABS: 10.0000 mg | ORAL_TABLET | Freq: Four times a day (QID) | ORAL | Status: DC | PRN

## 2014-02-12 MED ORDER — CLONAZEPAM 2 MG PO TABS: 2.0000 mg | ORAL_TABLET | Freq: Two times a day (BID) | ORAL | Status: DC | PRN

## 2014-02-12 MED ORDER — CYCLOBENZAPRINE HCL 10 MG PO TABS: 10.0000 mg | ORAL_TABLET | Freq: Three times a day (TID) | ORAL | Status: DC | PRN

## 2014-02-12 NOTE — Progress Notes (Signed)
Pre visit review using our clinic review tool, if applicable. No additional management support is needed unless otherwise documented below in the visit note. 

## 2014-02-12 NOTE — Progress Notes (Signed)
   Subjective:    Patient ID: Stephanie Olsen, female    DOB: [birth year], 68 y.o.   MRN: [id number]  HPI 68 yr old female for a cpx. She feels well in general other than some chronic fatigue. She has been stressed, both by good things and bad things. This past spring her husband died after a protracted illness and the adjustment has been difficult for her. On the other hand her daughter has a wedding coming up soon, and Stephanie Olsen has been very involved with the planning.    Review of Systems  Constitutional: Positive for fatigue. Negative for fever, chills, diaphoresis, activity change, appetite change and unexpected weight change.  HENT: Negative.   Eyes: Negative.   Respiratory: Negative.   Cardiovascular: Negative.   Gastrointestinal: Negative.   Genitourinary: Negative for dysuria, urgency, frequency, hematuria, flank pain, decreased urine volume, enuresis, difficulty urinating, pelvic pain and dyspareunia.  Musculoskeletal: Negative.   Skin: Negative.   Neurological: Negative.   Psychiatric/Behavioral: Negative.        Objective:   Physical Exam  Constitutional: She is oriented to person, place, and time. She appears well-developed and well-nourished. No distress.  HENT:  Head: Normocephalic and atraumatic.  Right Ear: External ear normal.  Left Ear: External ear normal.  Nose: Nose normal.  Mouth/Throat: Oropharynx is clear and moist. No oropharyngeal exudate.  Eyes: Conjunctivae and EOM are normal. Pupils are equal, round, and reactive to light. No scleral icterus.  Neck: Normal range of motion. Neck supple. No JVD present. No thyromegaly present.  Cardiovascular: Normal rate, regular rhythm, normal heart sounds and intact distal pulses.  Exam reveals no gallop and no friction rub.   No murmur heard. EKG normal   Pulmonary/Chest: Effort normal and breath sounds normal. No respiratory distress. She has no wheezes. She has no rales. She exhibits no tenderness.  Abdominal: Soft.  Bowel sounds are normal. She exhibits no distension and no mass. There is no tenderness. There is no rebound and no guarding.  Musculoskeletal: Normal range of motion. She exhibits no edema and no tenderness.  Lymphadenopathy:    She has no cervical adenopathy.  Neurological: She is alert and oriented to person, place, and time. She has normal reflexes. No cranial nerve deficit. She exhibits normal muscle tone. Coordination normal.  Skin: Skin is warm and dry. No rash noted. No erythema.  Psychiatric: She has a normal mood and affect. Her behavior is normal. Judgment and thought content normal.          Assessment & Plan:  Well exam. Get fasting labs.

## 2014-02-12 NOTE — Addendum Note (Signed)
Addended by: Aggie Hacker A on: 02/12/2014 11:51 AM   Modules accepted: Orders

## 2014-02-13 ENCOUNTER — Telehealth: Payer: Self-pay | Admitting: Family Medicine

## 2014-02-13 NOTE — Telephone Encounter (Signed)
emmi mailed  °

## 2014-03-13 DIAGNOSIS — H4011X2 Primary open-angle glaucoma, moderate stage: Secondary | ICD-10-CM | POA: Diagnosis not present

## 2014-03-17 ENCOUNTER — Other Ambulatory Visit: Payer: Self-pay | Admitting: Obstetrics and Gynecology

## 2014-03-17 ENCOUNTER — Other Ambulatory Visit: Payer: Medicare Other

## 2014-03-17 DIAGNOSIS — N63 Unspecified lump in breast: Secondary | ICD-10-CM | POA: Diagnosis not present

## 2014-03-17 DIAGNOSIS — N6310 Unspecified lump in the right breast, unspecified quadrant: Secondary | ICD-10-CM

## 2014-03-17 DIAGNOSIS — E2839 Other primary ovarian failure: Secondary | ICD-10-CM

## 2014-03-18 ENCOUNTER — Ambulatory Visit
Admission: RE | Admit: 2014-03-18 | Discharge: 2014-03-18 | Disposition: A | Discharge status: Home / Self Care | Payer: Medicare Other | Source: Ambulatory Visit | Attending: Obstetrics and Gynecology | Admitting: Obstetrics and Gynecology

## 2014-03-18 ENCOUNTER — Other Ambulatory Visit: Payer: Self-pay | Admitting: Obstetrics and Gynecology

## 2014-03-18 ENCOUNTER — Encounter (INDEPENDENT_AMBULATORY_CARE_PROVIDER_SITE_OTHER): Payer: Self-pay

## 2014-03-18 DIAGNOSIS — N63 Unspecified lump in breast: Secondary | ICD-10-CM | POA: Diagnosis not present

## 2014-03-18 DIAGNOSIS — N6310 Unspecified lump in the right breast, unspecified quadrant: Secondary | ICD-10-CM

## 2014-03-20 ENCOUNTER — Other Ambulatory Visit: Payer: Self-pay | Admitting: Obstetrics and Gynecology

## 2014-03-20 DIAGNOSIS — N6310 Unspecified lump in the right breast, unspecified quadrant: Secondary | ICD-10-CM

## 2014-03-23 ENCOUNTER — Ambulatory Visit
Admission: RE | Admit: 2014-03-23 | Discharge: 2014-03-23 | Disposition: A | Discharge status: Home / Self Care | Payer: Medicare Other | Source: Ambulatory Visit | Attending: Obstetrics and Gynecology | Admitting: Obstetrics and Gynecology

## 2014-03-23 DIAGNOSIS — N6310 Unspecified lump in the right breast, unspecified quadrant: Secondary | ICD-10-CM

## 2014-03-23 DIAGNOSIS — R590 Localized enlarged lymph nodes: Secondary | ICD-10-CM | POA: Diagnosis not present

## 2014-03-23 DIAGNOSIS — N63 Unspecified lump in breast: Secondary | ICD-10-CM | POA: Diagnosis not present

## 2014-03-23 DIAGNOSIS — C50811 Malignant neoplasm of overlapping sites of right female breast: Secondary | ICD-10-CM | POA: Diagnosis not present

## 2014-03-23 DIAGNOSIS — D129 Benign neoplasm of anus and anal canal: Secondary | ICD-10-CM | POA: Diagnosis not present

## 2014-03-24 ENCOUNTER — Other Ambulatory Visit: Payer: Self-pay | Admitting: Obstetrics and Gynecology

## 2014-03-24 DIAGNOSIS — C50911 Malignant neoplasm of unspecified site of right female breast: Secondary | ICD-10-CM

## 2014-03-25 ENCOUNTER — Telehealth: Payer: Self-pay | Admitting: *Deleted

## 2014-03-25 ENCOUNTER — Encounter: Payer: Self-pay | Admitting: *Deleted

## 2014-03-25 DIAGNOSIS — C50411 Malignant neoplasm of upper-outer quadrant of right female breast: Secondary | ICD-10-CM | POA: Insufficient documentation

## 2014-03-25 HISTORY — DX: Malignant neoplasm of upper-outer quadrant of right female breast: C50.411

## 2014-03-25 NOTE — Telephone Encounter (Signed)
Confirmed [facility] for 04/01/14 at 0800.  Instructions and contact information given.

## 2014-03-31 ENCOUNTER — Ambulatory Visit
Admission: RE | Admit: 2014-03-31 | Discharge: 2014-03-31 | Disposition: A | Discharge status: Home / Self Care | Payer: Medicare Other | Source: Ambulatory Visit | Attending: Obstetrics and Gynecology | Admitting: Obstetrics and Gynecology

## 2014-03-31 DIAGNOSIS — C50911 Malignant neoplasm of unspecified site of right female breast: Secondary | ICD-10-CM

## 2014-03-31 DIAGNOSIS — C50411 Malignant neoplasm of upper-outer quadrant of right female breast: Secondary | ICD-10-CM | POA: Diagnosis not present

## 2014-03-31 MED ORDER — GADOBENATE DIMEGLUMINE 529 MG/ML IV SOLN
19.0000 mL | Freq: Once | INTRAVENOUS | Status: AC | PRN
  Administered 2014-03-31: 19 mL via INTRAVENOUS

## 2014-04-01 ENCOUNTER — Telehealth: Payer: Self-pay | Admitting: Hematology and Oncology

## 2014-04-01 ENCOUNTER — Encounter: Payer: Self-pay | Admitting: Hematology and Oncology

## 2014-04-01 ENCOUNTER — Other Ambulatory Visit: Payer: Self-pay | Admitting: *Deleted

## 2014-04-01 ENCOUNTER — Ambulatory Visit (HOSPITAL_BASED_OUTPATIENT_CLINIC_OR_DEPARTMENT_OTHER): Payer: Medicare Other | Admitting: Hematology and Oncology

## 2014-04-01 ENCOUNTER — Encounter: Payer: Self-pay | Admitting: Physical Therapy

## 2014-04-01 ENCOUNTER — Other Ambulatory Visit (INDEPENDENT_AMBULATORY_CARE_PROVIDER_SITE_OTHER): Payer: Self-pay | Admitting: General Surgery

## 2014-04-01 ENCOUNTER — Ambulatory Visit: Payer: Medicare Other | Attending: General Surgery | Admitting: Physical Therapy

## 2014-04-01 ENCOUNTER — Other Ambulatory Visit (HOSPITAL_BASED_OUTPATIENT_CLINIC_OR_DEPARTMENT_OTHER): Payer: Medicare Other

## 2014-04-01 ENCOUNTER — Ambulatory Visit: Payer: Medicare Other

## 2014-04-01 ENCOUNTER — Ambulatory Visit
Admission: RE | Admit: 2014-04-01 | Discharge: 2014-04-01 | Disposition: A | Discharge status: Home / Self Care | Payer: Medicare Other | Source: Ambulatory Visit | Attending: Radiation Oncology | Admitting: Radiation Oncology

## 2014-04-01 VITALS — BP 152/75 | HR 77 | Temp 98.5°F | Resp 18 | Ht 65.25 in | Wt 201.0 lb

## 2014-04-01 DIAGNOSIS — C50411 Malignant neoplasm of upper-outer quadrant of right female breast: Secondary | ICD-10-CM | POA: Diagnosis not present

## 2014-04-01 DIAGNOSIS — M439 Deforming dorsopathy, unspecified: Secondary | ICD-10-CM | POA: Diagnosis not present

## 2014-04-01 DIAGNOSIS — C50811 Malignant neoplasm of overlapping sites of right female breast: Secondary | ICD-10-CM

## 2014-04-01 DIAGNOSIS — Z171 Estrogen receptor negative status [ER-]: Secondary | ICD-10-CM

## 2014-04-01 DIAGNOSIS — C50911 Malignant neoplasm of unspecified site of right female breast: Secondary | ICD-10-CM | POA: Diagnosis not present

## 2014-04-01 LAB — CBC WITH DIFFERENTIAL/PLATELET
BASO%: 0.3 % (ref 0.0–2.0)
Basophils Absolute: 0 10*3/uL (ref 0.0–0.1)
EOS ABS: 0.3 10*3/uL (ref 0.0–0.5)
EOS%: 4.5 % (ref 0.0–7.0)
HCT: 39.2 % (ref 34.8–46.6)
HGB: 13.4 g/dL (ref 11.6–15.9)
LYMPH%: 31.6 % (ref 14.0–49.7)
MCH: 29.4 pg (ref 25.1–34.0)
MCHC: 34.2 g/dL (ref 31.5–36.0)
MCV: 86 fL (ref 79.5–101.0)
MONO#: 0.8 10*3/uL (ref 0.1–0.9)
MONO%: 10.1 % (ref 0.0–14.0)
NEUT%: 53.5 % (ref 38.4–76.8)
NEUTROS ABS: 4.1 10*3/uL (ref 1.5–6.5)
Platelets: 287 10*3/uL (ref 145–400)
RBC: 4.56 10*6/uL (ref 3.70–5.45)
RDW: 14.1 % (ref 11.2–14.5)
WBC: 7.6 10*3/uL (ref 3.9–10.3)
lymph#: 2.4 10*3/uL (ref 0.9–3.3)

## 2014-04-01 LAB — COMPREHENSIVE METABOLIC PANEL (CC13)
ALK PHOS: 82 U/L (ref 40–150)
ALT: 19 U/L (ref 0–55)
ANION GAP: 9 meq/L (ref 3–11)
AST: 17 U/L (ref 5–34)
Albumin: 3.4 g/dL — ABNORMAL LOW (ref 3.5–5.0)
BUN: 14.9 mg/dL (ref 7.0–26.0)
CO2: 23 meq/L (ref 22–29)
Calcium: 9.8 mg/dL (ref 8.4–10.4)
Chloride: 105 mEq/L (ref 98–109)
Creatinine: 0.8 mg/dL (ref 0.6–1.1)
GLUCOSE: 93 mg/dL (ref 70–140)
POTASSIUM: 4.2 meq/L (ref 3.5–5.1)
SODIUM: 136 meq/L (ref 136–145)
TOTAL PROTEIN: 6.9 g/dL (ref 6.4–8.3)
Total Bilirubin: 0.54 mg/dL (ref 0.20–1.20)

## 2014-04-01 NOTE — Patient Instructions (Signed)

## 2014-04-01 NOTE — Progress Notes (Signed)
[facility] NEW PATIENT EVALUATION  Name: Stephanie Olsen MRN: [id number]  Date:   04/01/2014           DOB: 18-Jul-1945  Status: outpatient   CC: Stephanie Morale, MD  Stephanie Seltzer, MD    REFERRING PHYSICIAN: Excell Seltzer, MD   DIAGNOSIS: Clinical stage IIB (T3 N0 M0) invasive ductal/triple negative carcinoma of the right breast   HISTORY OF PRESENT ILLNESS:  Stephanie Olsen is a 68 y.o. female who is seen today at the [facility] through the courtesy of Dr. Adonis Olsen for evaluation of her T3 N0 triple negative invasive ductal carcinoma the right breast.  She noted an enlarging mass along the upper outer quadrant of the right breast approximately 3 weeks ago.  She underwent mammography and ultrasonography which showed a 5.0 x 4.9 cm mass at 10:00.  There were some questionably abnormal appearing lymph nodes within the axilla on ultrasound.  On 03/23/2014 she underwent a biopsy of the right breast mass and also an axillary lymph node.  The breast mass was diagnostic for grade 3 invasive ductal carcinoma, triple negative.  Ki-67 was 90%.  The axillary lymph node did not show lymphoid tissue and was felt to be benign.  MRI scan on 03/31/2014 showed a 5.7 cm mass with what was felt to be perhaps reactive lymph nodes within the axilla.  There was overlying skin thickening/dimpling.  She seen today with Dr. Lindi Olsen and Dr. Excell Olsen.  PREVIOUS RADIATION THERAPY: No   PAST MEDICAL HISTORY:  has a past medical history of Allergy; Hypertension; Osteoporosis; Anxiety; Headache(784.0); Chicken pox; Rosacea; Glaucoma; and Breast cancer of upper-outer quadrant of right female breast (03/25/2014).     PAST SURGICAL HISTORY:  Past Surgical History  Procedure Laterality Date  . Appendectomy    . Eye surgery    . Colonoscopy  07-24-11    per Dr. Earlean Shawl, adenomatous polyps,  repeat in 5 yrs     FAMILY HISTORY: family history includes Arthritis in an other family  member; Colon cancer in her paternal grandmother; Coronary artery disease in an other family member; Hypertension in an other family member; Lung cancer in her maternal uncle; Macular degeneration in an other family member; Stroke in an other family member.  Her father died from cardiomyopathy at age 51 and her mother died following a stroke at 48.   SOCIAL HISTORY:  reports that she has quit smoking. She has never used smokeless tobacco. She reports that she drinks about 1.0 oz of alcohol per week. She reports that she does not use illicit drugs. Widowed for the past 7 months, one biologic child, 3 stepchildren.  She worked as a Radio broadcast assistant.   ALLERGIES: Review of patient's allergies indicates no known allergies.   MEDICATIONS:  Current Outpatient Prescriptions  Medication Sig Dispense Refill  . Ascorbic Acid (VITAMIN C) 500 MG tablet Take 500 mg by mouth daily.      Marland Kitchen aspirin 81 MG tablet Take 81 mg by mouth daily.    . beta carotene w/minerals (OCUVITE) tablet Take 1 tablet by mouth daily.      . Calcium Carbonate-Vit D-Min 600-400 MG-UNIT CHEW Chew 1 tablet by mouth daily.      . cetirizine (ZYRTEC) 10 MG tablet Take 10 mg by mouth daily.      . clonazePAM (KLONOPIN) 2 MG tablet Take 1 tablet (2 mg total) by mouth 2 (two) times daily as needed for anxiety. 180 tablet 1  . cyclobenzaprine (  FLEXERIL) 10 MG tablet Take 1 tablet (10 mg total) by mouth 3 (three) times daily as needed for muscle spasms. For spasms 90 tablet 5  . fish oil-omega-3 fatty acids 1000 MG capsule Take 1 g by mouth daily.      . fluticasone (FLONASE) 50 MCG/ACT nasal spray Place 2 sprays into the nose daily. 48 g 3  . lisinopril (PRINIVIL,ZESTRIL) 10 MG tablet Take 1 tablet (10 mg total) by mouth daily. 90 tablet 3  . magnesium 30 MG tablet Take 30 mg by mouth daily.      . metroNIDAZOLE (METROGEL) 0.75 % gel Apply topically 2 (two) times daily as needed. For rosacea       No current facility-administered medications  for this encounter.     REVIEW OF SYSTEMS:  Pertinent items are noted in HPI.    PHYSICAL EXAM: Alert and oriented 68 year old white female appearing her stated age. Wt Readings from Last 3 Encounters:  04/01/14 201 lb (91.173 kg)  02/12/14 201 lb (91.173 kg)  02/25/13 196 lb (88.905 kg)   Temp Readings from Last 3 Encounters:  04/01/14 98.5 F (36.9 C) Oral  02/12/14 98.4 F (36.9 C)   02/25/13 98.1 F (36.7 C)    BP Readings from Last 3 Encounters:  04/01/14 152/75  02/12/14 133/84  02/25/13 114/68   Pulse Readings from Last 3 Encounters:  04/01/14 77  02/12/14 68  02/25/13 84    Head and neck examination: Grossly unremarkable.  Nodes: There is no palpable cervical, supraclavicular, or axillary lymphadenopathy.  Breast: There is a 6 cm  mass extending from 9 to 12:00 within the upper-outer quadrant of the right breast.   There is slight overlying erythema but this does not have the appearance of inflammatory carcinoma.  Left breast without masses or lesions.  Extremities: Without edema.    LABORATORY DATA:  Lab Results  Component Value Date   WBC 7.6 04/01/2014   HGB 13.4 04/01/2014   HCT 39.2 04/01/2014   MCV 86.0 04/01/2014   PLT 287 04/01/2014   Lab Results  Component Value Date   NA 136 04/01/2014   K 4.2 04/01/2014   CL 100 02/12/2014   CO2 23 04/01/2014   Lab Results  Component Value Date   ALT 19 04/01/2014   AST 17 04/01/2014   ALKPHOS 82 04/01/2014   BILITOT 0.54 04/01/2014      IMPRESSION: Clinical stage IIB (T3 N0 M0) invasive ductal/triple negative carcinoma the right breast.  We feel that she would benefit from neoadjuvant chemotherapy, then decide on breast conservation or mastectomy depending on her clinical response.  She will also be considered for genetic counseling.  If she does not have complete response to chemotherapy then she would require radiation therapy even if she has a mastectomy.  If she has a mastectomy and has a complete  response then we can discuss the controversy regarding the role of radiation therapy in the setting of clinical stage T3 N0 disease.  With her triple negative disease, I would lean towards post mastectomy radiation therapy should she have a mastectomy.  We discussed the potential acute and late toxicities of potential radiation therapy.  I can see her back following her definitive surgery.   PLAN: As discussed above.  I can see her following her definitive surgery.  I spent 30 minutes minutes face to face with the patient and more than 50% of that time was spent in counseling and/or coordination of care.

## 2014-04-01 NOTE — Progress Notes (Signed)
MD note created during office visit - copy to pt, original to scan.

## 2014-04-01 NOTE — Assessment & Plan Note (Signed)
Right breast invasive ductal carcinoma, palpable mass, 5 cm by mammogram and 5.7 cm by MRI grade 3, ER PR HER-2 negative, associated skin dimpling, T3, N0, M0 stage IIB Ki-67 90%  Pathology and radiology counseling: Discussed with the patient, the details of pathology including the type of breast cancer,the clinical staging, the significance of ER, PR and HER-2/neu receptors and the implications for treatment. After reviewing the pathology in detail, we proceeded to discuss the different treatment options between surgery, radiation, chemotherapy, antiestrogen therapies.  Recommendation: Neoadjuvant chemotherapy with dose dense Adriamycin Cytoxan every 2 weeks x4 followed by Taxol weekly x12 Followed by surgery and radiation  Chemotherapy counseling:I discussed the risks and benefits of chemotherapy including the risks of nausea/ vomiting, risk of infection from low WBC count, fatigue due to chemo or anemia, bruising or bleeding due to low platelets, mouth sores, loss/ change in taste and decreased appetite. Liver and kidney function will be monitored through out chemotherapy as abnormalities in liver and kidney function may be a side effect of treatment. Cardiac dysfunction due to Adriamycin was discussed in detail. Risk of permanent bone marrow dysfunction and leukemia due to chemo were also discussed.  Plan: 1. Echocardiogram 2. Port placement 3. Chemotherapy education class 4. PREVENT clinical trial information: Study looking for randomizes patients getting cardiotoxic chemotherapy to Lipitor versus placebo. Cardiac MRIs x2 will also be done as part of the trial. Patient was provided information.  Return to clinic after port placement for initiation of chemotherapy.

## 2014-04-01 NOTE — Progress Notes (Signed)
[place] NOTE  Patient Care Team: Laurey Morale, MD as PCP - General Excell Seltzer, MD as Consulting Physician (General Surgery) Rulon Eisenmenger, MD as Consulting Physician (Hematology and Oncology) Rexene Edison, MD as Consulting Physician (Radiation Oncology) Trinda Pascal, NP as Nurse Practitioner (Nurse Practitioner)  CHIEF COMPLAINTS/PURPOSE OF CONSULTATION:  Newly diagnosed breast cancer  HISTORY OF PRESENTING ILLNESS:  Stephanie Olsen 68 y.o. female is here because of recent diagnosis of right-sided breast cancer. She had felt a palpable mass in the upper-outer quadrant of the right breast. This led to a mammogram on 03/18/2014 that revealed a mass at 10:00 position measuring 5 cm. 2 axillary lymph nodes were noted to be abnormal. She underwent a biopsy 03/23/2014.pathology came back as invasive ductal carcinoma grade 3 triple negative disease. With a Ki-67 of 90%. She underwent an MRI of the breast on 03/31/2014 that revealed a 5.7 cm mass with skin dimpling. Biopsy of the axillary lymph node did not reveal any malignancy. Patient was presented this morning at the multidisciplinary tumor board and she is here today to discuss the treatment plan. She is accompanied by her friend. She has 4 children one was her biological child and 3 were from her husband side.  I reviewed her records extensively and collaborated the history with the patient.  SUMMARY OF ONCOLOGIC HISTORY:   Breast cancer of upper-outer quadrant of right female breast   03/18/2014 Mammogram Right breast suspicious mass 10:00 4.9 x 3 x 5 cm, 2 lower right axillary lymph nodes mildly thickened cortices   03/23/2014 Initial Biopsy Right breast needle biopsy 9:00: Invasive ductal carcinoma grade 3, ER PR HER-2 negative ratio 1.3   03/31/2014 Breast MRI Right breast mass 9:00 upper outer quadrant with contiguous skin involvement by 5.7 cm, right axillary lymph nodes normal in size     In terms of breast cancer risk profile:  She menarched at early age of 73 and half and went to menopause at age 29  She had  1 pregnancy, her first child was born at age 59  She has received birth control pills for approximately 2-3 years.  She was never exposed to fertility medications or hormone replacement therapy.  She has no family history of Breast/GYN/GI cancer  MEDICAL HISTORY:  Past Medical History  Diagnosis Date  . Allergy   . Hypertension   . Osteoporosis   . Anxiety   . Headache(784.0)   . Chicken pox   . Rosacea   . Glaucoma     sees Dr. Sarita Haver   . Breast cancer of upper-outer quadrant of right female breast 03/25/2014    SURGICAL HISTORY: Past Surgical History  Procedure Laterality Date  . Appendectomy    . Eye surgery    . Colonoscopy  07-24-11    per Dr. Earlean Shawl, adenomatous polyps,  repeat in 5 yrs    SOCIAL HISTORY: History   Social History  . Marital Status: Married    Spouse Name: N/A    Number of Children: N/A  . Years of Education: N/A   Occupational History  . Not on file.   Social History Main Topics  . Smoking status: Former Research scientist (life sciences)  . Smokeless tobacco: Never Used  . Alcohol Use: 1.0 oz/week    2 Not specified per week     Comment: weekends  . Drug Use: No  . Sexual Activity: Not on file   Other Topics Concern  . Not on file  Social History Narrative    FAMILY HISTORY: Family History  Problem Relation Age of Onset  . Arthritis    . Coronary artery disease    . Hypertension    . Stroke    . Macular degeneration    . Lung cancer Maternal Uncle   . Colon cancer Paternal Grandmother     ALLERGIES:  has No Known Allergies.  MEDICATIONS:  Current Outpatient Prescriptions  Medication Sig Dispense Refill  . Ascorbic Acid (VITAMIN C) 500 MG tablet Take 500 mg by mouth daily.      Marland Kitchen aspirin 81 MG tablet Take 81 mg by mouth daily.    . beta carotene w/minerals (OCUVITE) tablet Take 1 tablet by mouth daily.       . Calcium Carbonate-Vit D-Min 600-400 MG-UNIT CHEW Chew 1 tablet by mouth daily.      . cetirizine (ZYRTEC) 10 MG tablet Take 10 mg by mouth daily.      . clonazePAM (KLONOPIN) 2 MG tablet Take 1 tablet (2 mg total) by mouth 2 (two) times daily as needed for anxiety. 180 tablet 1  . cyclobenzaprine (FLEXERIL) 10 MG tablet Take 1 tablet (10 mg total) by mouth 3 (three) times daily as needed for muscle spasms. For spasms 90 tablet 5  . fish oil-omega-3 fatty acids 1000 MG capsule Take 1 g by mouth daily.      . fluticasone (FLONASE) 50 MCG/ACT nasal spray Place 2 sprays into the nose daily. 48 g 3  . lisinopril (PRINIVIL,ZESTRIL) 10 MG tablet Take 1 tablet (10 mg total) by mouth daily. 90 tablet 3  . magnesium 30 MG tablet Take 30 mg by mouth daily.      . metroNIDAZOLE (METROGEL) 0.75 % gel Apply topically 2 (two) times daily as needed. For rosacea       No current facility-administered medications for this visit.    REVIEW OF SYSTEMS:   Constitutional: Denies fevers, chills or abnormal night sweats Eyes: Denies blurriness of vision, double vision or watery eyes Ears, nose, mouth, throat, and face: Denies mucositis or sore throat Respiratory: Denies cough, dyspnea or wheezes Cardiovascular: Denies palpitation, chest discomfort or lower extremity swelling Gastrointestinal:  Denies nausea, heartburn or change in bowel habits Skin: Denies abnormal skin rashes Lymphatics: Denies new lymphadenopathy or easy bruising Neurological:Denies numbness, tingling or new weaknesses Behavioral/Psych: Mood is stable, no new changes  Breast: palpable mass in the right breast in the upper-outer quadrant  All other systems were reviewed with the patient and are negative.  PHYSICAL EXAMINATION: ECOG PERFORMANCE STATUS: 0 - Asymptomatic  Filed Vitals:   04/01/14 0839  BP: 152/75  Pulse: 77  Temp: 98.5 F (36.9 C)  Resp: 18   Filed Weights   04/01/14 0839  Weight: 201 lb (91.173 kg)     GENERAL:alert, no distress and comfortable SKIN: skin color, texture, turgor are normal, no rashes or significant lesions EYES: normal, conjunctiva are pink and non-injected, sclera clear OROPHARYNX:no exudate, no erythema and lips, buccal mucosa, and tongue normal  NECK: supple, thyroid normal size, non-tender, without nodularity LYMPH:  no palpable lymphadenopathy in the cervical, axillary or inguinal LUNGS: clear to auscultation and percussion with normal breathing effort HEART: regular rate & rhythm and no murmurs and no lower extremity edema ABDOMEN:abdomen soft, non-tender and normal bowel sounds Musculoskeletal:no cyanosis of digits and no clubbing  PSYCH: alert & oriented x 3 with fluent speech NEURO: no focal motor/sensory deficits BREAST: right breast mass at least 5-6 cm in size.  No palpable axillary or supraclavicular lymphadenopathy  LABORATORY DATA:  I have reviewed the data as listed Lab Results  Component Value Date   WBC 7.6 04/01/2014   HGB 13.4 04/01/2014   HCT 39.2 04/01/2014   MCV 86.0 04/01/2014   PLT 287 04/01/2014   Lab Results  Component Value Date   NA 136 04/01/2014   K 4.2 04/01/2014   CL 100 02/12/2014   CO2 23 04/01/2014    RADIOGRAPHIC STUDIES: I have personally reviewed the radiological reports and agreed with the findings in the report. Results are summarized as above  ASSESSMENT AND PLAN:  Breast cancer of upper-outer quadrant of right female breast Right breast invasive ductal carcinoma, palpable mass, 5 cm by mammogram and 5.7 cm by MRI grade 3, ER PR HER-2 negative, associated skin dimpling, T3, N0, M0 stage IIB Ki-67 90%  Pathology and radiology counseling: Discussed with the patient, the details of pathology including the type of breast cancer,the clinical staging, the significance of ER, PR and HER-2/neu receptors and the implications for treatment. After reviewing the pathology in detail, we proceeded to discuss the different  treatment options between surgery, radiation, chemotherapy, antiestrogen therapies.  Recommendation: Neoadjuvant chemotherapy with dose dense Adriamycin Cytoxan every 2 weeks x4 followed by Taxol weekly x12 Followed by surgery and radiation  Chemotherapy counseling:I discussed the risks and benefits of chemotherapy including the risks of nausea/ vomiting, risk of infection from low WBC count, fatigue due to chemo or anemia, bruising or bleeding due to low platelets, mouth sores, loss/ change in taste and decreased appetite. Liver and kidney function will be monitored through out chemotherapy as abnormalities in liver and kidney function may be a side effect of treatment. Cardiac dysfunction due to Adriamycin was discussed in detail. Risk of permanent bone marrow dysfunction and leukemia due to chemo were also discussed.  Plan: 1. Echocardiogram 2. Port placement 3. Chemotherapy education class 4. PREVENT clinical trial information: Study looking for randomizes patients getting cardiotoxic chemotherapy to Lipitor versus placebo. Cardiac MRIs x2 will also be done as part of the trial. Patient was provided information.  Return to clinic after port placement for initiation of chemotherapy.   All questions were answered. The patient knows to call the clinic with any problems, questions or concerns. I spent 55 minutes counseling the patient face to face. The total time spent in the appointment was 60 minutes and more than 50% was on counseling.     Rulon Eisenmenger, MD 04/01/2014 11:25 AM

## 2014-04-01 NOTE — Progress Notes (Signed)
Checked in new pt with no financial concerns at this time.  Pt has 2 insurances so financial assistance may not be needed but she has my card for any questions or concerns. ° °

## 2014-04-01 NOTE — Telephone Encounter (Signed)
per pof to sch tp appt-ECHO sch no pre-cert per Vaughan Basta pt to get updated copy before leaving

## 2014-04-01 NOTE — Progress Notes (Signed)
Ms. Castrillo is a very pleasant 68 y.o.Marland Kitchen female from [place], [place] with newly diagnosed grade 3 invasive ductal carcinoma of the right breast.  Biopsy results revealed the tumor's hormone status as ER negative, PR negative, and HER2/neu negative. Ki67 is 90%.  She presents today with her friend to the [facility]) for treatment consideration and recommendations from the breast surgeon, radiation oncologist, and medical oncologist.     I briefly met with Ms. Cude and her friend during her [facility] visit today. We discussed the purpose of the Survivorship Clinic, which will include monitoring for recurrence, coordinating completion of age and gender-appropriate cancer screenings, promotion of overall wellness, as well as managing potential late/long-term side effects of anti-cancer treatments.    It is important to note that Ms. Clausing's husband recently passed away about 7 months ago from multiple myeloma and was treated here at [facility] for over 9 years.  She is continuing to grieve the loss of her husband, as well as cope with her new cancer diagnosis and pending treatment in the same facility where her husband was treated.  I offered her expressive supportive counseling and spoke with our clinical social worker who will also see Ms. Smedley today as well.   As of today, the treatment plan for Ms. Knabe will likely include surgery, chemotherapy, and radiation therapy.  She will also meet with the Genetics Counselor due to the tumor's triple negative hormone status.  The intent of treatment for Ms. Reedy is cure, therefore she will be eligible for the Survivorship Clinic upon her completion of treatment.  Her survivorship care plan (SCP) document will be drafted and updated throughout the course of her treatment trajectory. She will receive the SCP in an office visit with myself in the Survivorship Clinic once she has completed treatment.   Ms. Correll  was encouraged to ask questions and all questions were answered to her satisfaction.  She was given my business card and encouraged to contact me with any concerns regarding survivorship.  I look forward to  participating in her care.   Mike Craze, NP

## 2014-04-01 NOTE — Therapy (Signed)
[place], [place], [address] Phone: [phone]   Fax:  [phone]  Physical Therapy Evaluation  Patient Details  Name: Stephanie Olsen MRN: [id number] Date of Birth: Oct 31, 1945  Encounter Date: 04/01/2014      PT End of Session - 04/01/14 1255    Visit Number 1   Number of Visits 1   PT Start Time 1020   PT Stop Time 1048   PT Time Calculation (min) 28 min   Activity Tolerance Patient tolerated treatment well   Behavior During Therapy [facility] for tasks assessed/performed      Past Medical History  Diagnosis Date  . Allergy   . Hypertension   . Osteoporosis   . Anxiety   . Headache(784.0)   . Chicken pox   . Rosacea   . Glaucoma     sees Dr. Sarita Haver   . Breast cancer of upper-outer quadrant of right female breast 03/25/2014    Past Surgical History  Procedure Laterality Date  . Appendectomy    . Eye surgery    . Colonoscopy  07-24-11    per Dr. Earlean Shawl, adenomatous polyps,  repeat in 5 yrs    There were no vitals taken for this visit.  Visit Diagnosis:  Carcinoma of upper-outer quadrant of right female breast - Plan: PT plan of care cert/re-cert  Postural deformity - Plan: PT plan of care cert/re-cert      Subjective Assessment - 04/01/14 1248    Symptoms Patient was seen today at the breast multidisciplinary clinic for her new diagnosis of right upper outer breast cancer.   Pertinent History Patient was diagnosed with right upper outer Triple negative breast cancer on 03/24/14.  Ki67 is 90%.    Her mass is > 5 cm.   Patient Stated Goals Learn lymphedema risk reduction and post op shoulder ROM exercises.   Currently in Pain? No/denies          [facility] PT Assessment - 04/01/14 0001    Assessment   Medical Diagnosis right breast cancer   Onset Date 03/24/14   Precautions   Precautions Other (comment)  active cancer   Balance Screen   Has the patient fallen in the past 6 months No    Has the patient had a decrease in activity level because of a fear of falling?  No   Is the patient reluctant to leave their home because of a fear of falling?  No   Home Environment   Living Enviornment Private residence   Living Arrangements Alone  Her husband dies 7 months ago from cancer   Available Help at Discharge Friend(s)   Prior Function   Level of Independence Independent with basic ADLs   Vocation Retired   Leisure She does yoga 4-5 times per week   Cognition   Overall Cognitive Status Within Functional Limits for tasks assessed   Posture/Postural Control   Posture/Postural Control Postural limitations   Postural Limitations Rounded Shoulders;Forward head   AROM   Right Shoulder Extension 52 Degrees   Right Shoulder Flexion 141 Degrees   Right Shoulder ABduction 157 Degrees   Right Shoulder Internal Rotation 74 Degrees   Right Shoulder External Rotation 85 Degrees   Left Shoulder Extension 58 Degrees   Left Shoulder Flexion 135 Degrees   Left Shoulder ABduction 148 Degrees   Left Shoulder Internal Rotation 70 Degrees   Left Shoulder External Rotation 82 Degrees   Strength   Overall Strength Within functional limits  for tasks performed            PT Education - 04/29/2014 1255    Education provided Yes   Education Details Shoulder ROM exercises; lymphedema risk reduction   Person(s) Educated Patient   Methods Explanation;Demonstration;Handout   Comprehension Verbalized understanding              Plan - 2014/04/29 1255    Clinical Impression Statement Patient was seen today for a baseline assessment related to lymphedema and shoulder function.  She is planning to have neoadjuvant chemotherapy followed by a right lumpectomy or mastectomy with a sentinel node biopsy followed by possible radiation.   Pt will benefit from skilled therapeutic intervention in order to improve on the following deficits Decreased range of motion;Increased edema;Increased fascial  restricitons;Impaired UE functional use;Decreased strength  if needed after surgery.   Rehab Potential Excellent   Clinical Impairments Affecting Rehab Potential none   PT Frequency One time visit   PT Treatment/Interventions Therapeutic exercise;Patient/family education   Consulted and Agree with Plan of Care Patient;Family member/caregiver   Family Member Consulted friend Ubaldo Glassing - 04/29/14 1258    Functional Assessment Tool Used Clinical judgement   Functional Limitation Other PT primary   Other PT Primary Current Status [phone]) At least 1 percent but less than 20 percent impaired, limited or restricted   Other PT Primary Goal Status (E7209) At least 1 percent but less than 20 percent impaired, limited or restricted   Other PT Primary Discharge Status [phone]) At least 1 percent but less than 20 percent impaired, limited or restricted            LYMPHEDEMA/ONCOLOGY QUESTIONNAIRE - 2014-04-29 1253    Type   Cancer Type right breast cancer   Lymphedema Assessments   Lymphedema Assessments Upper extremities   Right Upper Extremity Lymphedema   10 cm Proximal to Olecranon Process 30.4 cm   Olecranon Process 27 cm   10 cm Proximal to Ulnar Styloid Process 22 cm   Just Proximal to Ulnar Styloid Process 15.9 cm   Across Hand at [employer] 19.5 cm   At [place] of 2nd Digit 7 cm   Left Upper Extremity Lymphedema   10 cm Proximal to Olecranon Process 30.5 cm   Olecranon Process 25.7 cm   10 cm Proximal to Ulnar Styloid Process 22 cm   Just Proximal to Ulnar Styloid Process 15.5 cm   Across Hand at [employer] 19 cm   At [place] of 2nd Digit 6.7 cm                     Breast Clinic Goals - 2014-04-29 1257    Patient will be able to verbalize understanding of pertinent lymphedema risk reduction practices relevant to her diagnosis specifically related to skin care.   Time 1   Period Days   Status Achieved   Patient will be able to return  demonstrate and/or verbalize understanding of the post-op home exercise program related to regaining shoulder range of motion.   Time 1   Period Days   Status Achieved   Patient will be able to verbalize understanding of the importance of attending the postoperative After Breast Cancer Class for further lymphedema risk reduction education and therapeutic exercise.   Time 1   Period Days   Status Achieved           Problem List Patient Active Problem List  Diagnosis Date Noted  . Breast cancer of upper-outer quadrant of right female breast 03/25/2014  . Glaucoma 02/25/2013  . Anxiety state 10/23/2007  . Essential hypertension 10/23/2007  . ALLERGIC RHINITIS 10/23/2007  . ACNE ROSACEA 10/23/2007  . Osteoporosis 10/23/2007  . Headache 10/23/2007    Rhea Thrun,MARTI COOPER, PT 04/01/2014, 12:59 PM

## 2014-04-02 ENCOUNTER — Telehealth: Payer: Self-pay | Admitting: Hematology and Oncology

## 2014-04-02 ENCOUNTER — Other Ambulatory Visit: Payer: Medicare Other

## 2014-04-02 NOTE — Telephone Encounter (Signed)
pt called this morning re genetcis appt for this afternoon. pt states she was unaware of the appt and only came across it on her avs report. moved appt from 12/3 to 12/9. pt has new d/t. also confirmed echo appt for 12/8 @ 10am @ [facility]. lmonvm informing LM.

## 2014-04-03 ENCOUNTER — Telehealth: Payer: Self-pay

## 2014-04-03 ENCOUNTER — Telehealth: Payer: Self-pay | Admitting: Hematology and Oncology

## 2014-04-03 ENCOUNTER — Other Ambulatory Visit: Payer: Self-pay | Admitting: Hematology and Oncology

## 2014-04-03 DIAGNOSIS — C50411 Malignant neoplasm of upper-outer quadrant of right female breast: Secondary | ICD-10-CM

## 2014-04-03 MED ORDER — PROCHLORPERAZINE MALEATE 10 MG PO TABS: 10.0000 mg | ORAL_TABLET | Freq: Four times a day (QID) | ORAL | Status: DC | PRN

## 2014-04-03 MED ORDER — LORAZEPAM 0.5 MG PO TABS: 0.5000 mg | ORAL_TABLET | Freq: Four times a day (QID) | ORAL | Status: DC | PRN

## 2014-04-03 MED ORDER — ONDANSETRON HCL 8 MG PO TABS: 8.0000 mg | ORAL_TABLET | Freq: Two times a day (BID) | ORAL | Status: DC | PRN

## 2014-04-03 MED ORDER — LIDOCAINE-PRILOCAINE 2.5-2.5 % EX CREA: TOPICAL_CREAM | CUTANEOUS | Status: DC

## 2014-04-03 MED ORDER — DEXAMETHASONE 4 MG PO TABS: ORAL_TABLET | ORAL | Status: DC

## 2014-04-03 NOTE — Telephone Encounter (Signed)
Phoned in prescription to [facility] for Ativan.    LMOVM for pt that meds were ordered.  Pt to call clinic if she has any questions.

## 2014-04-03 NOTE — Telephone Encounter (Signed)
, °

## 2014-04-06 ENCOUNTER — Encounter (HOSPITAL_COMMUNITY)
Admission: RE | Admit: 2014-04-06 | Discharge: 2014-04-06 | Disposition: A | Discharge status: Home / Self Care | Payer: Medicare Other | Source: Ambulatory Visit | Attending: General Surgery | Admitting: General Surgery

## 2014-04-06 ENCOUNTER — Encounter: Payer: Self-pay | Admitting: *Deleted

## 2014-04-06 ENCOUNTER — Ambulatory Visit (HOSPITAL_COMMUNITY)
Admission: RE | Admit: 2014-04-06 | Discharge: 2014-04-06 | Disposition: A | Discharge status: Home / Self Care | Payer: Medicare Other | Source: Ambulatory Visit | Attending: Anesthesiology | Admitting: Anesthesiology

## 2014-04-06 ENCOUNTER — Other Ambulatory Visit: Payer: Self-pay | Admitting: *Deleted

## 2014-04-06 ENCOUNTER — Encounter (HOSPITAL_COMMUNITY): Payer: Self-pay

## 2014-04-06 ENCOUNTER — Telehealth: Payer: Self-pay | Admitting: *Deleted

## 2014-04-06 DIAGNOSIS — Z6832 Body mass index (BMI) 32.0-32.9, adult: Secondary | ICD-10-CM | POA: Diagnosis not present

## 2014-04-06 DIAGNOSIS — N959 Unspecified menopausal and perimenopausal disorder: Secondary | ICD-10-CM | POA: Diagnosis not present

## 2014-04-06 DIAGNOSIS — C50411 Malignant neoplasm of upper-outer quadrant of right female breast: Secondary | ICD-10-CM | POA: Diagnosis not present

## 2014-04-06 DIAGNOSIS — Z8249 Family history of ischemic heart disease and other diseases of the circulatory system: Secondary | ICD-10-CM | POA: Diagnosis not present

## 2014-04-06 DIAGNOSIS — Z87891 Personal history of nicotine dependence: Secondary | ICD-10-CM | POA: Diagnosis not present

## 2014-04-06 DIAGNOSIS — I10 Essential (primary) hypertension: Secondary | ICD-10-CM

## 2014-04-06 DIAGNOSIS — R05 Cough: Secondary | ICD-10-CM | POA: Diagnosis not present

## 2014-04-06 DIAGNOSIS — G43909 Migraine, unspecified, not intractable, without status migrainosus: Secondary | ICD-10-CM | POA: Diagnosis not present

## 2014-04-06 DIAGNOSIS — K649 Unspecified hemorrhoids: Secondary | ICD-10-CM | POA: Diagnosis not present

## 2014-04-06 DIAGNOSIS — F418 Other specified anxiety disorders: Secondary | ICD-10-CM | POA: Diagnosis not present

## 2014-04-06 DIAGNOSIS — Z8261 Family history of arthritis: Secondary | ICD-10-CM | POA: Diagnosis not present

## 2014-04-06 DIAGNOSIS — N63 Unspecified lump in breast: Secondary | ICD-10-CM | POA: Diagnosis not present

## 2014-04-06 DIAGNOSIS — M549 Dorsalgia, unspecified: Secondary | ICD-10-CM | POA: Diagnosis not present

## 2014-04-06 DIAGNOSIS — Z836 Family history of other diseases of the respiratory system: Secondary | ICD-10-CM | POA: Diagnosis not present

## 2014-04-06 DIAGNOSIS — Z853 Personal history of malignant neoplasm of breast: Secondary | ICD-10-CM | POA: Diagnosis not present

## 2014-04-06 DIAGNOSIS — E669 Obesity, unspecified: Secondary | ICD-10-CM | POA: Diagnosis not present

## 2014-04-06 DIAGNOSIS — Z86010 Personal history of colonic polyps: Secondary | ICD-10-CM | POA: Diagnosis not present

## 2014-04-06 DIAGNOSIS — M1990 Unspecified osteoarthritis, unspecified site: Secondary | ICD-10-CM | POA: Diagnosis not present

## 2014-04-06 DIAGNOSIS — Z800 Family history of malignant neoplasm of digestive organs: Secondary | ICD-10-CM | POA: Diagnosis not present

## 2014-04-06 DIAGNOSIS — I878 Other specified disorders of veins: Secondary | ICD-10-CM | POA: Diagnosis not present

## 2014-04-06 HISTORY — DX: Other specified disorders of bone density and structure, unspecified site: M85.80

## 2014-04-06 NOTE — Progress Notes (Signed)
EKG epic 02/12/2014 Dr Lindi Adie LOV note epic 04/01/2014 Dr Sarajane Jews LOV note epic 02/12/2014  Labs epic - CMET and CBCD 04/01/2014

## 2014-04-06 NOTE — Progress Notes (Signed)
Your patient has screened at an elevated risk for Obstructive Sleep Apnea using the Stop-Bang Tool during a pre-surgical vist. A score of 4 or greater is an elevated risk. Score of 4. 

## 2014-04-06 NOTE — Patient Instructions (Addendum)
[place]  04/06/2014   Your procedure is scheduled on:     Tuesday April 07, 2014  Report to [facility] Main Entrance and follow signs to  [facility] arrive at [place] AM.  Call this number if you have problems the morning of surgery [phone] or Presurgical Testing [phone].   Remember:  Do not eat food or drink liquids :After Midnight.      Take these medicines the morning of surgery with A SIP OF WATER: Cetirizine (Zyrtec);Timolol eye gtts;Flonase if needed                               You may not have any metal on your body including hair pins and piercings  Do not wear jewelry, make-up, lotions, powders, or deodorant.  Do not shave body hair  48 hours(2 days) of CHG soap use.                Do not bring valuables to the hospital. [place].  Contacts, dentures or bridgework may not be worn into surgery.    Patients discharged the day of surgery will not be allowed to drive home.  Name and phone number of your driver:Kathy Freddi Starr (cousin)   ________________________________________________________________________  [facility] - Preparing for Surgery Before surgery, you can play an important role.  Because skin is not sterile, your skin needs to be as free of germs as possible.  You can reduce the number of germs on your skin by washing with CHG (chlorahexidine gluconate) soap before surgery.  CHG is an antiseptic cleaner which kills germs and bonds with the skin to continue killing germs even after washing. Please DO NOT use if you have an allergy to CHG or antibacterial soaps.  If your skin becomes reddened/irritated stop using the CHG and inform your nurse when you arrive at Short Stay. Do not shave (including legs and underarms) for at least 48 hours prior to the first CHG shower.  You may shave your face/neck. Please follow these instructions carefully:  1.  Shower with CHG Soap the night before surgery and  the  morning of Surgery.  2.  If you choose to wash your hair, wash your hair first as usual with your  normal  shampoo.  3.  After you shampoo, rinse your hair and body thoroughly to remove the  shampoo.                           4.  Use CHG as you would any other liquid soap.  You can apply chg directly  to the skin and wash                       Gently with a scrungie or clean washcloth.  5.  Apply the CHG Soap to your body ONLY FROM THE NECK DOWN.   Do not use on face/ open                           Wound or open sores. Avoid contact with eyes, ears mouth and genitals (private parts).                       Wash face,  Genitals (private parts) with your normal soap.  6.  Wash thoroughly, paying special attention to the area where your surgery  will be performed.  7.  Thoroughly rinse your body with warm water from the neck down.  8.  DO NOT shower/wash with your normal soap after using and rinsing off  the CHG Soap.                9.  Pat yourself dry with a clean towel.            10.  Wear clean pajamas.            11.  Place clean sheets on your bed the night of your first shower and do not  sleep with pets. Day of Surgery : Do not apply any lotions/deodorants the morning of surgery.  Please wear clean clothes to the hospital/surgery center.  FAILURE TO FOLLOW THESE INSTRUCTIONS MAY RESULT IN THE CANCELLATION OF YOUR SURGERY PATIENT SIGNATURE_________________________________  NURSE SIGNATURE__________________________________  ________________________________________________________________________

## 2014-04-06 NOTE — Telephone Encounter (Signed)
Per staff message and POF I have scheduled appts. Advised scheduler of appts. JMW  

## 2014-04-06 NOTE — Progress Notes (Signed)
[place] Psychosocial Distress Screening Clinical Social Work  Patient completed distress screening protocol and scored a 8 on the Psychosocial Distress Thermometer which indicates moderate distress. Clinical Social Worker met with patient and patients friend in [facility] on 04/01/14 to assess for distress and other psychosocial needs.  Patient stated she was doing "ok", and felt her level of distress had decreased after meeting with the treatment team and getting more information on her treatment plan.  Patients husband was also treated at [facility] and passed away 7 months ago.  Patient expressed multiple emotions associated with grief and adjusting to being treated in the same facility as her husband.  Patient also described the emotional stress of transitioning from the role of caregiver to now being the patient.  CSW validated patients feelings and provided additional emotional support.  Patient informed CSW that she was involved in grief counseling and planned to continue.  Patient stated most of her distress was due to the family meeting she had scheduled to inform her children on her cancer diagnosis.  CSW and patient discussed tools to use when sharing the information with her family.  CSW and patient also discussed common emotional responses to being diagnosed with cancer, and the importance of self care and emotional support.  CSW informed patient of the support team and support services at [facility] and patient was agreeable to an alight guide referral.  CSW encouraged patient and patients family to call with any questions or concerns.     ONCBCN DISTRESS SCREENING 04/01/2014  Screening Type Initial Screening  Distress experienced in past week (1-10) 8  Family Problem type Children;Other (comment)  Emotional problem type Nervousness/Anxiety;Adjusting to illness  Information Concerns Type Lack of info about diagnosis;Lack of info about treatment;Lack of info about complementary therapy choices  Physical Problem  type Sleep/insomnia;Constipation/diarrhea  Physician notified of physical symptoms Yes  Referral to clinical social work Yes  Referral to support programs Yes   Johnnye Lana, MSW, LCSW, OSW-C Clinical Social Worker [place] [phone]

## 2014-04-06 NOTE — Telephone Encounter (Signed)
Spoke to patient from [facility] 04/01/14.  She is aware of all her appointments.  No questions or concerns at this time.  Encouraged her to call with anything she needed.

## 2014-04-06 NOTE — Telephone Encounter (Signed)
Patient called with questions about Decadron and Ativan. Concerned that Decadron will "rev her up". Advised patient that Decadron will help with taking her chemo better. Patient states possible contraindication with Ativan and glaucoma, patient to determine which type of glaucoma she has. Also wanted to know when she would be starting chemo, advised her that Dr. Lindi Adie wanted her to start on 12/11 and that the scheduler would be calling her with appt times. Patient verbalized understanding.

## 2014-04-07 ENCOUNTER — Other Ambulatory Visit: Payer: Medicare Other

## 2014-04-07 ENCOUNTER — Ambulatory Visit (HOSPITAL_COMMUNITY)
Admission: RE | Admit: 2014-04-07 | Discharge: 2014-04-07 | Disposition: A | Discharge status: Home / Self Care | Payer: Medicare Other | Source: Ambulatory Visit | Attending: General Surgery | Admitting: General Surgery

## 2014-04-07 ENCOUNTER — Ambulatory Visit (HOSPITAL_COMMUNITY): Payer: Medicare Other | Admitting: Anesthesiology

## 2014-04-07 ENCOUNTER — Encounter (HOSPITAL_COMMUNITY)
Admission: RE | Disposition: A | Discharge status: Home / Self Care | Payer: Self-pay | Source: Ambulatory Visit | Attending: General Surgery

## 2014-04-07 ENCOUNTER — Encounter (HOSPITAL_COMMUNITY): Payer: Self-pay | Admitting: *Deleted

## 2014-04-07 ENCOUNTER — Encounter: Payer: Self-pay | Admitting: *Deleted

## 2014-04-07 ENCOUNTER — Ambulatory Visit (HOSPITAL_COMMUNITY): Payer: Medicare Other

## 2014-04-07 DIAGNOSIS — Z6832 Body mass index (BMI) 32.0-32.9, adult: Secondary | ICD-10-CM | POA: Insufficient documentation

## 2014-04-07 DIAGNOSIS — C50911 Malignant neoplasm of unspecified site of right female breast: Secondary | ICD-10-CM | POA: Diagnosis not present

## 2014-04-07 DIAGNOSIS — C50411 Malignant neoplasm of upper-outer quadrant of right female breast: Secondary | ICD-10-CM | POA: Diagnosis not present

## 2014-04-07 DIAGNOSIS — Z836 Family history of other diseases of the respiratory system: Secondary | ICD-10-CM | POA: Insufficient documentation

## 2014-04-07 DIAGNOSIS — Z95828 Presence of other vascular implants and grafts: Secondary | ICD-10-CM

## 2014-04-07 DIAGNOSIS — F418 Other specified anxiety disorders: Secondary | ICD-10-CM | POA: Insufficient documentation

## 2014-04-07 DIAGNOSIS — N959 Unspecified menopausal and perimenopausal disorder: Secondary | ICD-10-CM | POA: Insufficient documentation

## 2014-04-07 DIAGNOSIS — I878 Other specified disorders of veins: Secondary | ICD-10-CM | POA: Insufficient documentation

## 2014-04-07 DIAGNOSIS — E669 Obesity, unspecified: Secondary | ICD-10-CM | POA: Insufficient documentation

## 2014-04-07 DIAGNOSIS — Z87891 Personal history of nicotine dependence: Secondary | ICD-10-CM | POA: Insufficient documentation

## 2014-04-07 DIAGNOSIS — M1990 Unspecified osteoarthritis, unspecified site: Secondary | ICD-10-CM | POA: Insufficient documentation

## 2014-04-07 DIAGNOSIS — I10 Essential (primary) hypertension: Secondary | ICD-10-CM | POA: Insufficient documentation

## 2014-04-07 DIAGNOSIS — Z86010 Personal history of colonic polyps: Secondary | ICD-10-CM | POA: Insufficient documentation

## 2014-04-07 DIAGNOSIS — Z8249 Family history of ischemic heart disease and other diseases of the circulatory system: Secondary | ICD-10-CM | POA: Insufficient documentation

## 2014-04-07 DIAGNOSIS — Z8261 Family history of arthritis: Secondary | ICD-10-CM | POA: Insufficient documentation

## 2014-04-07 DIAGNOSIS — Z800 Family history of malignant neoplasm of digestive organs: Secondary | ICD-10-CM | POA: Insufficient documentation

## 2014-04-07 DIAGNOSIS — N63 Unspecified lump in breast: Secondary | ICD-10-CM | POA: Insufficient documentation

## 2014-04-07 DIAGNOSIS — Z853 Personal history of malignant neoplasm of breast: Secondary | ICD-10-CM | POA: Diagnosis not present

## 2014-04-07 DIAGNOSIS — G43909 Migraine, unspecified, not intractable, without status migrainosus: Secondary | ICD-10-CM | POA: Insufficient documentation

## 2014-04-07 DIAGNOSIS — Z4682 Encounter for fitting and adjustment of non-vascular catheter: Secondary | ICD-10-CM | POA: Diagnosis not present

## 2014-04-07 DIAGNOSIS — M549 Dorsalgia, unspecified: Secondary | ICD-10-CM | POA: Insufficient documentation

## 2014-04-07 DIAGNOSIS — K649 Unspecified hemorrhoids: Secondary | ICD-10-CM | POA: Insufficient documentation

## 2014-04-07 HISTORY — PX: PORTACATH PLACEMENT: SHX2246

## 2014-04-07 SURGERY — INSERTION, TUNNELED CENTRAL VENOUS DEVICE, WITH PORT
Anesthesia: General

## 2014-04-07 MED ORDER — BUPIVACAINE-EPINEPHRINE (PF) 0.25% -1:200000 IJ SOLN
INTRAMUSCULAR | Status: AC
  Filled 2014-04-07: qty 30

## 2014-04-07 MED ORDER — SODIUM BICARBONATE 4 % IV SOLN
INTRAVENOUS | Status: AC
  Filled 2014-04-07: qty 5

## 2014-04-07 MED ORDER — MIDAZOLAM HCL 2 MG/2ML IJ SOLN
INTRAMUSCULAR | Status: AC
  Filled 2014-04-07: qty 2

## 2014-04-07 MED ORDER — 0.9 % SODIUM CHLORIDE (POUR BTL) OPTIME
TOPICAL | Status: DC | PRN
  Administered 2014-04-07: 1000 mL

## 2014-04-07 MED ORDER — LIDOCAINE HCL (CARDIAC) 20 MG/ML IV SOLN
INTRAVENOUS | Status: AC
  Filled 2014-04-07: qty 5

## 2014-04-07 MED ORDER — FENTANYL CITRATE 0.05 MG/ML IJ SOLN: 25.0000 ug | INTRAMUSCULAR | Status: DC | PRN

## 2014-04-07 MED ORDER — CEFAZOLIN SODIUM-DEXTROSE 2-3 GM-% IV SOLR
2.0000 g | INTRAVENOUS | Status: AC
  Administered 2014-04-07: 2 g via INTRAVENOUS

## 2014-04-07 MED ORDER — ONDANSETRON HCL 4 MG/2ML IJ SOLN
INTRAMUSCULAR | Status: DC | PRN
  Administered 2014-04-07: 4 mg via INTRAVENOUS

## 2014-04-07 MED ORDER — CHLORHEXIDINE GLUCONATE 4 % EX LIQD: 1.0000 "application " | Freq: Once | CUTANEOUS | Status: DC

## 2014-04-07 MED ORDER — FENTANYL CITRATE 0.05 MG/ML IJ SOLN
INTRAMUSCULAR | Status: AC
  Filled 2014-04-07: qty 5

## 2014-04-07 MED ORDER — FENTANYL CITRATE 0.05 MG/ML IJ SOLN
INTRAMUSCULAR | Status: DC | PRN
  Administered 2014-04-07 (×2): 50 ug via INTRAVENOUS

## 2014-04-07 MED ORDER — BUPIVACAINE HCL (PF) 0.5 % IJ SOLN
INTRAMUSCULAR | Status: AC
  Filled 2014-04-07: qty 30

## 2014-04-07 MED ORDER — LACTATED RINGERS IV SOLN
INTRAVENOUS | Status: DC | PRN
Start: 2014-04-07 — End: 2014-04-07
  Administered 2014-04-07: 10:00:00 via INTRAVENOUS

## 2014-04-07 MED ORDER — MIDAZOLAM HCL 5 MG/5ML IJ SOLN
INTRAMUSCULAR | Status: DC | PRN
  Administered 2014-04-07: 2 mg via INTRAVENOUS

## 2014-04-07 MED ORDER — HYDROCODONE-ACETAMINOPHEN 5-325 MG PO TABS: 1.0000 | ORAL_TABLET | ORAL | Status: DC | PRN

## 2014-04-07 MED ORDER — LIDOCAINE HCL 1 % IJ SOLN
INTRAMUSCULAR | Status: DC | PRN
  Administered 2014-04-07: 60 mg via INTRADERMAL

## 2014-04-07 MED ORDER — ONDANSETRON HCL 4 MG/2ML IJ SOLN
INTRAMUSCULAR | Status: AC
  Filled 2014-04-07: qty 2

## 2014-04-07 MED ORDER — FENTANYL CITRATE 0.05 MG/ML IJ SOLN
INTRAMUSCULAR | Status: AC
  Filled 2014-04-07: qty 2

## 2014-04-07 MED ORDER — LIDOCAINE HCL 0.5 % IJ SOLN
INTRAMUSCULAR | Status: AC
  Filled 2014-04-07: qty 1

## 2014-04-07 MED ORDER — HEPARIN SOD (PORK) LOCK FLUSH 100 UNIT/ML IV SOLN
INTRAVENOUS | Status: AC
  Filled 2014-04-07: qty 5

## 2014-04-07 MED ORDER — HEPARIN SOD (PORK) LOCK FLUSH 100 UNIT/ML IV SOLN
INTRAVENOUS | Status: DC | PRN
  Administered 2014-04-07: 500 [IU]

## 2014-04-07 MED ORDER — PROPOFOL 10 MG/ML IV BOLUS
INTRAVENOUS | Status: AC
  Filled 2014-04-07: qty 20

## 2014-04-07 MED ORDER — PROMETHAZINE HCL 25 MG/ML IJ SOLN: 6.2500 mg | INTRAMUSCULAR | Status: DC | PRN

## 2014-04-07 MED ORDER — BUPIVACAINE-EPINEPHRINE 0.25% -1:200000 IJ SOLN
INTRAMUSCULAR | Status: DC | PRN
  Administered 2014-04-07: 10 mL

## 2014-04-07 MED ORDER — PROPOFOL 10 MG/ML IV BOLUS
INTRAVENOUS | Status: DC | PRN
  Administered 2014-04-07: 120 mg via INTRAVENOUS
  Administered 2014-04-07: 40 mg via INTRAVENOUS

## 2014-04-07 MED ORDER — SODIUM CHLORIDE 0.9 % IR SOLN
Freq: Once | Status: AC
  Administered 2014-04-07: 11:00:00
  Filled 2014-04-07: qty 1.2

## 2014-04-07 MED ORDER — CEFAZOLIN SODIUM-DEXTROSE 2-3 GM-% IV SOLR
INTRAVENOUS | Status: AC
  Filled 2014-04-07: qty 50

## 2014-04-07 SURGICAL SUPPLY — 38 items
ADH SKN CLS APL DERMABOND .7 (GAUZE/BANDAGES/DRESSINGS) ×1
APL SKNCLS STERI-STRIP NONHPOA (GAUZE/BANDAGES/DRESSINGS) ×1
BAG DECANTER FOR FLEXI CONT (MISCELLANEOUS) ×3 IMPLANT
BENZOIN TINCTURE PRP APPL 2/3 (GAUZE/BANDAGES/DRESSINGS) ×3 IMPLANT
BLADE HEX COATED 2.75 (ELECTRODE) ×3 IMPLANT
BLADE SURG 15 STRL LF DISP TIS (BLADE) ×1 IMPLANT
BLADE SURG 15 STRL SS (BLADE) ×3
CLOSURE WOUND 1/2 X4 (GAUZE/BANDAGES/DRESSINGS)
DECANTER SPIKE VIAL GLASS SM (MISCELLANEOUS) ×3 IMPLANT
DERMABOND ADVANCED (GAUZE/BANDAGES/DRESSINGS) ×2
DERMABOND ADVANCED .7 DNX12 (GAUZE/BANDAGES/DRESSINGS) IMPLANT
DRAPE C-ARM 42X120 X-RAY (DRAPES) ×3 IMPLANT
DRAPE LAPAROTOMY TRNSV 102X78 (DRAPE) ×3 IMPLANT
DRSG TEGADERM 4X4.75 (GAUZE/BANDAGES/DRESSINGS) ×2 IMPLANT
ELECT REM PT RETURN 9FT ADLT (ELECTROSURGICAL) ×3
ELECTRODE REM PT RTRN 9FT ADLT (ELECTROSURGICAL) ×1 IMPLANT
GAUZE SPONGE 2X2 8PLY STRL LF (GAUZE/BANDAGES/DRESSINGS) IMPLANT
GAUZE SPONGE 4X4 12PLY STRL (GAUZE/BANDAGES/DRESSINGS) ×3 IMPLANT
GAUZE SPONGE 4X4 16PLY XRAY LF (GAUZE/BANDAGES/DRESSINGS) ×3 IMPLANT
GLOVE BIOGEL PI IND STRL 7.0 (GLOVE) ×1 IMPLANT
GLOVE BIOGEL PI INDICATOR 7.0 (GLOVE) ×2
GOWN STRL REUS W/TWL XL LVL3 (GOWN DISPOSABLE) ×6 IMPLANT
KIT BASIN OR (CUSTOM PROCEDURE TRAY) ×3 IMPLANT
KIT PORT POWER 8FR ISP CVUE (Catheter) ×2 IMPLANT
MARKER SKIN DUAL TIP RULER LAB (MISCELLANEOUS) ×3 IMPLANT
NDL HYPO 25X1 1.5 SAFETY (NEEDLE) ×1 IMPLANT
NEEDLE HYPO 22GX1.5 SAFETY (NEEDLE) IMPLANT
NEEDLE HYPO 25X1 1.5 SAFETY (NEEDLE) ×3 IMPLANT
NS IRRIG 1000ML POUR BTL (IV SOLUTION) ×3 IMPLANT
PACK BASIC VI WITH GOWN DISP (CUSTOM PROCEDURE TRAY) ×3 IMPLANT
PENCIL BUTTON HOLSTER BLD 10FT (ELECTRODE) ×3 IMPLANT
SPONGE GAUZE 2X2 STER 10/PKG (GAUZE/BANDAGES/DRESSINGS) ×2
STRIP CLOSURE SKIN 1/2X4 (GAUZE/BANDAGES/DRESSINGS) IMPLANT
SUT MNCRL AB 4-0 PS2 18 (SUTURE) ×3 IMPLANT
SUT PROLENE 2 0 CT2 30 (SUTURE) ×5 IMPLANT
SYR CONTROL 10ML LL (SYRINGE) ×3 IMPLANT
SYRINGE 10CC LL (SYRINGE) ×6 IMPLANT
TOWEL OR 17X26 10 PK STRL BLUE (TOWEL DISPOSABLE) ×3 IMPLANT

## 2014-04-07 NOTE — Discharge Instructions (Signed)
PORT-A-CATH: POST OP INSTRUCTIONS  Always review your discharge instruction sheet given to you by the facility where your surgery was performed.   1. A prescription for pain medication may be given to you upon discharge. Take your pain medication as prescribed, if needed. If narcotic pain medicine is not needed, then you make take acetaminophen (Tylenol) or ibuprofen (Advil) as needed.  2. Take your usually prescribed medications unless otherwise directed. 3. If you need a refill on your pain medication, please contact our office. All narcotic pain medicine now requires a paper prescription.  Phoned in and fax refills are no longer allowed by law.  Prescriptions will not be filled after 5 pm or on weekends.  4. You should follow a light diet for the remainder of the day after your procedure. 5. Most patients will experience some mild swelling and/or bruising in the area of the incision. It may take several days to resolve. 6. It is common to experience some constipation if taking pain medication after surgery. Increasing fluid intake and taking a stool softener (such as Colace) will usually help or prevent this problem from occurring. A mild laxative (Milk of Magnesia or Miralax) should be taken according to package directions if there are no bowel movements after 48 hours.  7. Unless discharge instructions indicate otherwise, you may remove your bandages 48 hours after surgery, and you may shower at that time. You may have steri-strips (small white skin tapes) in place directly over the incision.  These strips should be left on the skin for 7-10 days.  If your surgeon used Dermabond (skin glue) on the incision, you may shower in 24 hours.  The glue will flake off over the next 2-3 weeks.  8. If your port is left accessed at the end of surgery (needle left in port), the dressing cannot get wet and should only by changed by a healthcare professional. When the port is no longer accessed (when the  needle has been removed), follow step 7.   9. ACTIVITIES:  Limit activity involving your arms for the next 72 hours. Do no strenuous exercise or activity for 1 week. You may drive when you are no longer taking prescription pain medication, you can comfortably wear a seatbelt, and you can maneuver your car. 10.You may need to see your doctor in the office for a follow-up appointment.  Please       check with your doctor.  11.When you receive a new Port-a-Cath, you will get a product guide and        ID card.  Please keep them in case you need them.  WHEN TO CALL YOUR DOCTOR [phone]): 1. Fever over 101.0 2. Chills 3. Continued bleeding from incision 4. Increased redness and tenderness at the site 5. Shortness of breath, difficulty breathing   The clinic staff is available to answer your questions during regular business hours. Please dont hesitate to call and ask to speak to one of the nurses or medical assistants for clinical concerns. If you have a medical emergency, go to the nearest emergency room or call 911.  A surgeon from [facility] is always on call at the hospital.     For further information, please visit [email or web address]    General Anesthesia, Care After Refer to this sheet in the next few weeks. These instructions provide you with information on caring for yourself after your procedure. Your health care provider may also give you more specific instructions. Your treatment has  been planned according to current medical practices, but problems sometimes occur. Call your health care provider if you have any problems or questions after your procedure. WHAT TO EXPECT AFTER THE PROCEDURE After the procedure, it is typical to experience:  Sleepiness.  Nausea and vomiting. HOME CARE INSTRUCTIONS  For the first 24 hours after general anesthesia:  Have a responsible person with you.  Do not drive a car. If you are alone, do not take public  transportation.  Do not drink alcohol.  Do not take medicine that has not been prescribed by your health care provider.  Do not sign important papers or make important decisions.  You may resume a normal diet and activities as directed by your health care provider.  Change bandages (dressings) as directed.  If you have questions or problems that seem related to general anesthesia, call the hospital and ask for the anesthetist or anesthesiologist on call. SEEK MEDICAL CARE IF:  You have nausea and vomiting that continue the day after anesthesia.  You develop a rash. SEEK IMMEDIATE MEDICAL CARE IF:   You have difficulty breathing.  You have chest pain.  You have any allergic problems. Document Released: 07/24/2000 Document Revised: 04/22/2013 Document Reviewed: 10/31/2012 [facility] Patient Information 2015 [place], [place]. This information is not intended to replace advice given to you by your health care provider. Make sure you discuss any questions you have with your health care provider. Implanted [facility] Guide An implanted port is a type of central line that is placed under the skin. Central lines are used to provide IV access when treatment or nutrition needs to be given through a person's veins. Implanted ports are used for long-term IV access. An implanted port may be placed because:   You need IV medicine that would be irritating to the small veins in your hands or arms.   You need long-term IV medicines, such as antibiotics.   You need IV nutrition for a long period.   You need frequent blood draws for lab tests.   You need dialysis.  Implanted ports are usually placed in the chest area, but they can also be placed in the upper arm, the abdomen, or the leg. An implanted port has two main parts:   Reservoir. The reservoir is round and will appear as a small, raised area under your skin. The reservoir is the part where a needle is inserted to give medicines or draw  blood.   Catheter. The catheter is a thin, flexible tube that extends from the reservoir. The catheter is placed into a large vein. Medicine that is inserted into the reservoir goes into the catheter and then into the vein.  HOW WILL I CARE FOR MY INCISION SITE? Do not get the incision site wet. Bathe or shower as directed by your health care provider.  HOW IS MY PORT ACCESSED? Special steps must be taken to access the port:   Before the port is accessed, a numbing cream can be placed on the skin. This helps numb the skin over the port site.   Your health care provider uses a sterile technique to access the port.  Your health care provider must put on a mask and sterile gloves.  The skin over your port is cleaned carefully with an antiseptic and allowed to dry.  The port is gently pinched between sterile gloves, and a needle is inserted into the port.  Only "non-coring" port needles should be used to access the port. Once the port  is accessed, a blood return should be checked. This helps ensure that the port is in the vein and is not clogged.   If your port needs to remain accessed for a constant infusion, a clear (transparent) bandage will be placed over the needle site. The bandage and needle will need to be changed every week, or as directed by your health care provider.   Keep the bandage covering the needle clean and dry. Do not get it wet. Follow your health care provider's instructions on how to take a shower or bath while the port is accessed.   If your port does not need to stay accessed, no bandage is needed over the port.  WHAT IS FLUSHING? Flushing helps keep the port from getting clogged. Follow your health care provider's instructions on how and when to flush the port. Ports are usually flushed with saline solution or a medicine called heparin. The need for flushing will depend on how the port is used.   If the port is used for intermittent medicines or blood draws,  the port will need to be flushed:   After medicines have been given.   After blood has been drawn.   As part of routine maintenance.   If a constant infusion is running, the port may not need to be flushed.  HOW LONG WILL MY PORT STAY IMPLANTED? The port can stay in for as long as your health care provider thinks it is needed. When it is time for the port to come out, surgery will be done to remove it. The procedure is similar to the one performed when the port was put in.  WHEN SHOULD I SEEK IMMEDIATE MEDICAL CARE? When you have an implanted port, you should seek immediate medical care if:   You notice a bad smell coming from the incision site.   You have swelling, redness, or drainage at the incision site.   You have more swelling or pain at the port site or the surrounding area.   You have a fever that is not controlled with medicine. Document Released: 04/17/2005 Document Revised: 02/05/2013 Document Reviewed: 12/23/2012 [facility] Patient Information 2015 [place], [place]. This information is not intended to replace advice given to you by your health care provider. Make sure you discuss any questions you have with your health care provider.

## 2014-04-07 NOTE — Op Note (Signed)
Preoperative diagnosis: Cancer of the breast and the poor venous access  Postoperative diagnosis: Same  Procedure: Placement of ClearVue subcutaneous venous port  Surgeon: Excell Seltzer M.D.  Anesthesia: LMA General  Description of procedure: Patient is brought to the operating room and placed in the supine position on the operating table. IV sedation was administered. The entire upper chest and neck were widely sterilely prepped and draped. Local anesthesia was used to infiltrate the insertion of port site. The left subclavian vein was cannulated with a needle and guidewire without difficulty and position in the superior vena cava was confirmed by fluoroscopy. The introducer was then placed over the guidewire and the flushed catheter placed via the introducer which was stripped away and the tip of the catheter positioned near the cavoatrial junction. A small transverse incision was made in the anterior chest wall and subcutaneous pocket created. The catheter was tunneled into the pocket, trimmed to length, and attached to the flushed port which was positioned in the pocket. The port was sutured to the chest wall with interrupted 2-0 Prolene. The incisions were closed with subcutaneous interrupted Monocryl and the skin incisions closed with subcuticular Monocryl and Dermabond. The port was accessed and flushed and aspirated easily and was left flushed with concentrated heparin solution. Sponge needle as the counts were correct. The patient was taken to recovery in good condition.  Shandon Burlingame T  04/07/2014

## 2014-04-07 NOTE — Anesthesia Postprocedure Evaluation (Signed)
  Anesthesia Post-op Note  Patient: Stephanie Olsen  Procedure(s) Performed: Procedure(s) (LRB): INSERTION PORT-A-CATH (N/A)  Patient Location: PACU  Anesthesia Type: General  Level of Consciousness: awake and alert   Airway and Oxygen Therapy: Patient Spontanous Breathing  Post-op Pain: mild  Post-op Assessment: Post-op Vital signs reviewed, Patient's Cardiovascular Status Stable, Respiratory Function Stable, Patent Airway and No signs of Nausea or vomiting  Last Vitals:  Filed Vitals:   04/07/14 1237  BP: 132/67  Pulse: 65  Temp: 36.6 C  Resp: 12    Post-op Vital Signs: stable. Occasional PVC. No symptoms   Complications: No apparent anesthesia complications. CXR OK

## 2014-04-07 NOTE — Transfer of Care (Signed)
Immediate Anesthesia Transfer of Care Note  Patient: Stephanie Olsen  Procedure(s) Performed: Procedure(s): INSERTION PORT-A-CATH (N/A)  Patient Location: PACU  Anesthesia Type:General  Level of Consciousness: awake, alert  and oriented  Airway & Oxygen Therapy: Patient Spontanous Breathing and Patient connected to face mask oxygen  Post-op Assessment: Report given to PACU RN and Post -op Vital signs reviewed and stable  Post vital signs: Reviewed and stable  Complications: No apparent anesthesia complications

## 2014-04-07 NOTE — H&P (Signed)
History of Present Illness Stephanie Olsen T. Irlene Crudup MD; 04/01/2014 12:15 PM) The patient is a 68 year old female who presents with breast cancer. She is a post menopausal female referred [name] for evaluation of recently diagnosed carcinoma of the right breast. she states she recently found a mass in her right breast which she initially discovered only about 3 weeks ago. She feels it has enlarged since then. She was referred for evaluation at the breast center.. Subsequent imaging included diagnostic mamogram showing an irregular spiculatreastss in the upper outer quadrant of the right breast and ultrasound showing aan irregular hypoechoic mass in the 10:00 position of the right breast 4 cm from the nipple measuring 4.9 x 3 x 5.0 cm. Evaluation of the right axilla shows 2 lower axillary lymph nodes with mildly thickened cortices. An ultrasound guided breast biopsy was performed on 03/23/2014 with pathology revealing invasive carcinoma of the breast. axillary biopsy showed only fibroadipose tissue. Subsequent bilateral breast MRI was performed revealing a centrally necrotic oval mass in the upper outer quadrant of the right breast measuring 5.7 x 4.7 cm. No other abnormal areas of enhancement and right axillary lymph nodes appeared normal. She is seen now in breast multidisciplinary clinic for initial treatment planning. She has experienced a palpable lump with apparent rapid enlargement as noted above. She does not have a personal history of any previous breast problems. Findings at that time were the following: Tumor size: 5.7 cm Tumor grade: 3 Estrogen Receptor: negative Progesterone Receptor: negative Her-2 neu: nnegative Lymph node status: negative   Other Problems Erasmo Leventhal, RN, BSN; 04/01/2014 8:57 AM) Anxiety Disorder Arthritis Back Pain Breast Cancer Depression Hemorrhoids High blood pressure Lump In Breast Migraine Headache  Past Surgical History Erasmo Leventhal, RN, BSN; 04/01/2014 8:57 AM) Appendectomy Breast Biopsy Right. Cataract Surgery Bilateral. Colon Polyp Removal - Colonoscopy Oral Surgery Sentinel Lymph Node Biopsy  Diagnostic Studies History (Crystal Dollard, RN, BSN; 04/01/2014 8:57 AM) Colonoscopy 1-5 years ago Mammogram within last year Pap Smear 1-5 years ago  Social History (Erasmo Leventhal, RN, BSN; 04/01/2014 8:57 AM) Alcohol use Moderate alcohol use. Caffeine use Carbonated beverages, Coffee. No drug use Tobacco use Former smoker.  Family History (Erasmo Leventhal, RN, BSN; 04/01/2014 8:57 AM) Arthritis Family Members In General, Father. Bleeding disorder Father. Colon Cancer Family Members In General. Heart Disease Family Members In General, Mother. Heart disease in female family member before age 43 Hypertension Mother. Ischemic Bowel Disease Family Members In General. Respiratory Condition Family Members In General. Seizure disorder Father.  Pregnancy / Birth History Erasmo Leventhal, RN, BSN; 04/01/2014 8:57 AM) Age at menarche 62 years. Age of menopause 51-55 Contraceptive History Oral contraceptives. Gravida 2 Irregular periods Maternal age 48-30 Para 1  Review of Systems Occupational hygienist, BSN; 04/01/2014 8:57 AM) General Present- Weight Gain. Not Present- Appetite Loss, Chills, Fatigue, Fever, Night Sweats and Weight Loss. Skin Present- Dryness. Not Present- Change in Wart/Mole, Hives, Jaundice, New Lesions, Non-Healing Wounds, Rash and Ulcer. HEENT Present- Seasonal Allergies and Wears glasses/contact lenses. Not Present- Earache, Hearing Loss, Hoarseness, Nose Bleed, Oral Ulcers, Ringing in the Ears, Sinus Pain, Sore Throat, Visual Disturbances and Yellow Eyes. Respiratory Not Present- Bloody sputum, Chronic Cough, Difficulty Breathing, Snoring and Wheezing. Breast Present- Breast Mass. Not Present- Breast Pain, Nipple Discharge and Skin Changes. Cardiovascular Not  Present- Chest Pain, Difficulty Breathing Lying Down, Leg Cramps, Palpitations, Rapid Heart Rate, Shortness of Breath and Swelling of Extremities. Gastrointestinal Present- Change in Bowel Habits, Chronic diarrhea  and Hemorrhoids. Not Present- Abdominal Pain, Bloating, Bloody Stool, Constipation, Difficulty Swallowing, Excessive gas, Gets full quickly at meals, Indigestion, Nausea, Rectal Pain and Vomiting. Female Genitourinary Present- Urgency. Not Present- Frequency, Nocturia, Painful Urination and Pelvic Pain. Musculoskeletal Present- Joint Stiffness. Not Present- Back Pain, Joint Pain, Muscle Pain, Muscle Weakness and Swelling of Extremities. Neurological Not Present- Decreased Memory, Fainting, Headaches, Numbness, Seizures, Tingling, Tremor, Trouble walking and Weakness. Psychiatric Present- Anxiety, Change in Sleep Pattern, Fearful and Frequent crying. Not Present- Bipolar and Depression. Endocrine Not Present- Cold Intolerance, Excessive Hunger, Hair Changes, Heat Intolerance, Hot flashes and New Diabetes. Hematology Present- Easy Bruising. Not Present- Excessive bleeding, Gland problems, HIV and Persistent Infections.   Physical Exam Stephanie Olsen T. Celene Pippins MD; 04/01/2014 12:17 PM) The physical exam findings are as follows: Note:General: Alert, currently obese Caucasian female, in no distress Skin: Warm and dry without rash or infection. HEENT: No palpable masses or thyromegaly. Sclera nonicteric. Pupils equal round and reactive. Oropharynx clear. Lymph nodes: No cervical, supraclavicular, or inguinal nodes palpable. Breasts: Large breasts bilaterally. In the upper outer quadrant of the right breast near the nipple is a large freely movater palpable mass measuring at least 6 cm in diameter with some mild overlying skin erythema but no skin thickening or clinical evidence of inflammatory cancer. Lungs: Breath sounds clear and equal. No wheezing or increased work of breathing. Cardiovascular:  Regular rate and rhythm without murmer. No JVD or edema. Peripheral pulses intact. No carotid bruits. Abdomen: Nondistended. Soft and nontender. No masses palpable. No organomegaly. No palpable hernias. Extremities: No edema or joint swelling or deformity. No chronic venous stasis changes. Neurologic: Alert and fully oriented. Gait normal. No focal weakness. Psychiatric: Normal mood and affect. Thought content appropriate with normal judgement and insight    Assessment & Plan Stephanie Olsen T. Brenen Beigel MD; 04/01/2014 12:21 PM) BREAST CANCER, RIGHT (174.9  C50.911) Impression: 68 year old female with a new diagnosis of cancer of the right breast, uupper outer quadrant. Clinical stage II B, triple negative. I discussed with the patient and family members present today initial surgical treatment options. We discussed options of breast conservation with lumpectomy or total mastectomy and sentinal lymph node biopsy/dissection. with her large triple negative tumor and rapid growth I believe she would be an excellent candidate for neoadjuvant chemotherapy. We discussed that ultimate sutumry to treatmentd depend on the response of her tumor to treatment. We recommend I d scusrisks of anesthesia and nature of the surgery and risks of anesthesia and nature of the surgery and risks of anesthesia, bleeding, infection, pneumothorax and long-term risks of catheter occlusion or displacement and possible DVT. All her questions were answered and she desires to proceed. We will try to schedule this as quickly as [email or web address].e based on the apparent rapid growth of her tumor. Current Plans  Schedule for Surgery Port-A-Cath placement under general anesthesia as an outpatient

## 2014-04-07 NOTE — Interval H&P Note (Signed)
History and Physical Interval Note:  04/07/2014 10:19 AM  Stephanie Olsen  has presented today for surgery, with the diagnosis of cancer right breast  The various methods of treatment have been discussed with the patient and family. After consideration of risks, benefits and other options for treatment, the patient has consented to  Procedure(s): INSERTION PORT-A-CATH (N/A) as a surgical intervention .  The patient's history has been reviewed, patient examined, no change in status, stable for surgery.  I have reviewed the patient's chart and labs.  Questions were answered to the patient's satisfaction.     [name]

## 2014-04-07 NOTE — Anesthesia Preprocedure Evaluation (Signed)
Anesthesia Evaluation  Patient identified by MRN, date of birth, ID band Patient awake    Reviewed: Allergy & Precautions, H&P , NPO status , Patient's Chart, lab work & pertinent test results  Airway Mallampati: II  TM Distance: >3 FB Neck ROM: Full    Dental no notable dental hx.    Pulmonary former smoker,  breath sounds clear to auscultation  Pulmonary exam normal       Cardiovascular Exercise Tolerance: Good hypertension, Pt. on medications Rhythm:Regular Rate:Normal     Neuro/Psych  Headaches, Anxiety    GI/Hepatic negative GI ROS, Neg liver ROS,   Endo/Other  negative endocrine ROS  Renal/GU negative Renal ROS  negative genitourinary   Musculoskeletal negative musculoskeletal ROS (+)   Abdominal (+) + obese,   Peds negative pediatric ROS (+)  Hematology negative hematology ROS (+)   Anesthesia Other Findings   Reproductive/Obstetrics negative OB ROS                             Anesthesia Physical Anesthesia Plan  ASA: II  Anesthesia Plan: General   Post-op Pain Management:    Induction: Intravenous  Airway Management Planned: LMA  Additional Equipment:   Intra-op Plan:   Post-operative Plan: Extubation in OR  Informed Consent: I have reviewed the patients History and Physical, chart, labs and discussed the procedure including the risks, benefits and alternatives for the proposed anesthesia with the patient or authorized representative who has indicated his/her understanding and acceptance.   Dental advisory given  Plan Discussed with: CRNA  Anesthesia Plan Comments:         Anesthesia Quick Evaluation

## 2014-04-08 ENCOUNTER — Other Ambulatory Visit (HOSPITAL_BASED_OUTPATIENT_CLINIC_OR_DEPARTMENT_OTHER): Payer: Medicare Other

## 2014-04-08 ENCOUNTER — Encounter (HOSPITAL_COMMUNITY): Payer: Self-pay | Admitting: General Surgery

## 2014-04-08 ENCOUNTER — Ambulatory Visit: Payer: Medicare Other | Admitting: Hematology and Oncology

## 2014-04-08 ENCOUNTER — Ambulatory Visit (HOSPITAL_BASED_OUTPATIENT_CLINIC_OR_DEPARTMENT_OTHER): Payer: Medicare Other | Admitting: Genetic Counselor

## 2014-04-08 DIAGNOSIS — C50811 Malignant neoplasm of overlapping sites of right female breast: Secondary | ICD-10-CM | POA: Diagnosis not present

## 2014-04-08 DIAGNOSIS — Z801 Family history of malignant neoplasm of trachea, bronchus and lung: Secondary | ICD-10-CM

## 2014-04-08 DIAGNOSIS — C50411 Malignant neoplasm of upper-outer quadrant of right female breast: Secondary | ICD-10-CM

## 2014-04-08 DIAGNOSIS — Z315 Encounter for genetic counseling: Secondary | ICD-10-CM | POA: Diagnosis not present

## 2014-04-08 DIAGNOSIS — Z800 Family history of malignant neoplasm of digestive organs: Secondary | ICD-10-CM

## 2014-04-08 LAB — CBC WITH DIFFERENTIAL/PLATELET
BASO%: 0.6 % (ref 0.0–2.0)
BASOS ABS: 0.1 10*3/uL (ref 0.0–0.1)
EOS ABS: 0.4 10*3/uL (ref 0.0–0.5)
EOS%: 4.8 % (ref 0.0–7.0)
HCT: 39.3 % (ref 34.8–46.6)
HEMOGLOBIN: 12.8 g/dL (ref 11.6–15.9)
LYMPH#: 2.7 10*3/uL (ref 0.9–3.3)
LYMPH%: 31.5 % (ref 14.0–49.7)
MCH: 28.9 pg (ref 25.1–34.0)
MCHC: 32.6 g/dL (ref 31.5–36.0)
MCV: 88.4 fL (ref 79.5–101.0)
MONO#: 0.8 10*3/uL (ref 0.1–0.9)
MONO%: 8.8 % (ref 0.0–14.0)
NEUT%: 54.3 % (ref 38.4–76.8)
NEUTROS ABS: 4.7 10*3/uL (ref 1.5–6.5)
Platelets: 282 10*3/uL (ref 145–400)
RBC: 4.45 10*6/uL (ref 3.70–5.45)
RDW: 14.4 % (ref 11.2–14.5)
WBC: 8.6 10*3/uL (ref 3.9–10.3)

## 2014-04-08 LAB — COMPREHENSIVE METABOLIC PANEL (CC13)
ALBUMIN: 3.4 g/dL — AB (ref 3.5–5.0)
ALT: 19 U/L (ref 0–55)
ANION GAP: 9 meq/L (ref 3–11)
AST: 19 U/L (ref 5–34)
Alkaline Phosphatase: 80 U/L (ref 40–150)
BUN: 14.2 mg/dL (ref 7.0–26.0)
CALCIUM: 9.3 mg/dL (ref 8.4–10.4)
CHLORIDE: 103 meq/L (ref 98–109)
CO2: 25 meq/L (ref 22–29)
Creatinine: 0.8 mg/dL (ref 0.6–1.1)
EGFR: 74 mL/min/{1.73_m2} — ABNORMAL LOW (ref >90)
GLUCOSE: 96 mg/dL (ref 70–140)
POTASSIUM: 4.3 meq/L (ref 3.5–5.1)
Sodium: 137 mEq/L (ref 136–145)
TOTAL PROTEIN: 6.5 g/dL (ref 6.4–8.3)
Total Bilirubin: 0.54 mg/dL (ref 0.20–1.20)

## 2014-04-08 NOTE — Progress Notes (Signed)
REFERRING PROVIDER: Laurey Morale, MD [place], [place] [address]  PRIMARY PROVIDER:  Laurey Morale, MD  PRIMARY REASON FOR VISIT:  1. Breast cancer of upper-outer quadrant of right female breast   2. Family history of colon cancer      HISTORY OF PRESENT ILLNESS:   Stephanie Olsen, a 68 y.o. female, was seen for a [place] cancer genetics consultation at the request of Dr. Lindi Adie due to a personal history of cancer.  Stephanie Olsen presents to clinic today to discuss the possibility of a hereditary predisposition to cancer, genetic testing, and to further clarify her future cancer risks, as well as potential cancer risks for family members.   CANCER HISTORY:    Breast cancer of upper-outer quadrant of right female breast   03/18/2014 Mammogram Right breast suspicious mass 10:00 4.9 x 3 x 5 cm, 2 lower right axillary lymph nodes mildly thickened cortices   03/23/2014 Initial Biopsy Right breast needle biopsy 9:00: Invasive ductal carcinoma grade 3, ER PR HER-2 negative ratio 1.3   03/31/2014 Breast MRI Right breast mass 9:00 upper outer quadrant with contiguous skin involvement by 5.7 cm, right axillary lymph nodes normal in size     HISTORY OF PRESENT ILLNESS: In 2015, at the age of 67, Stephanie Olsen was diagnosed with invasive ductal carcioma of the breast.  The tumor is triple negative.  Chemotherapy will be started this coming Friday.  She is unaware of anyone in the family having genetic testing.  She just found out that her daughter, who is pregnant, is having a girl. Therefore she is concerned about her risk for hereditary cancer and how it could affect her daughter and granddaughter.   HORMONAL RISK FACTORS:  Menarche was at age 66.  First live birth at age 33.  OCP use for approximately 3 years.  Ovaries intact: yes.  Hysterectomy: no.  Menopausal status: postmenopausal.  HRT use: 0 and 1 years. Colonoscopy: yes; abnormal. Mammogram within the last  year: yes. Number of breast biopsies: 0. Up to date with pelvic exams:  yes. Any excessive radiation exposure in the past:  no  Past Medical History  Diagnosis Date  . Allergy   . Hypertension   . Osteoporosis     pt states has ostopenia not osteoporosis   . Anxiety   . Headache(784.0)   . Chicken pox   . Rosacea   . Glaucoma     sees Dr. Sarita Haver   . Breast cancer of upper-outer quadrant of right female breast 03/25/2014  . Osteopenia     Past Surgical History  Procedure Laterality Date  . Appendectomy    . Colonoscopy  07-24-11    per Dr. Earlean Shawl, adenomatous polyps,  repeat in 5 yrs  . Eye surgery      laser per right eye related to pressure relief; past cataract surgery bilat   . Portacath placement N/A 04/07/2014    Procedure: INSERTION PORT-A-CATH;  Surgeon: Excell Seltzer, MD;  Location: [facility];  Service: General;  Laterality: N/A;    History   Social History  . Marital Status: Widowed    Spouse Name: N/A    Number of Children: N/A  . Years of Education: N/A   Social History Main Topics  . Smoking status: Former Smoker -- 1.00 packs/day for 10 years    Quit date: 05/02/1975  . Smokeless tobacco: Never Used  . Alcohol Use: 1.0 oz/week    2 Not specified per week  Comment: weekends-wine  . Drug Use: No  . Sexual Activity: Not on file   Other Topics Concern  . Not on file   Social History Narrative     FAMILY HISTORY:  We obtained a detailed, 4-generation family history.  Significant diagnoses are listed below: Family History  Problem Relation Age of Onset  . Arthritis    . Coronary artery disease    . Hypertension    . Stroke    . Macular degeneration    . Lung cancer Maternal Uncle     heavy smoker  . Colon cancer Paternal Grandmother 29  . Lung cancer Cousin     smoker  . Leukemia Cousin 3   Stephanie Olsen is an only child, but her mother had four sisters and two brothers.  One brother, who was a smoker had lung cancer, but all  others died of heart disease.  Her father had two brothers and two sisters, none of whom had cancer.    Patient's maternal ancestors are of [place] descent, and paternal ancestors are of [place] descent. There is no reported Ashkenazi Jewish ancestry. There is no known consanguinity.  GENETIC COUNSELING ASSESSMENT: Stephanie Olsen is a 68 y.o. female with a personal history of triple negative breast cancer at age 14 which somewhat suggestive of a sporadic cancer. We, therefore, discussed and recommended the following at today's visit.   DISCUSSION: We reviewed the characteristics, features and inheritance patterns of hereditary cancer syndromes. We also discussed genetic testing, including the appropriate family members to test, the process of testing, insurance coverage and turn-around-time for results. We discussed with Stephanie Olsen that the family history is not highly consistent with a familial hereditary cancer syndrome, despite her triple negative status, and we feel she is at low risk to harbor a gene mutation associated with such a condition. Thus, we did not recommend any genetic testing, at this time, and recommended Stephanie Olsen continue to follow the cancer screening guidelines given by her primary healthcare provider.  Stephanie Olsen was worried about how not testing would affect her daughter since her daughter now has two parents with cancer.  We reviewed that if we would not offer testing to Stephanie Olsen, we would not offer testing to her daughter, even though her father had cancer as well.   PLAN: We encouraged Stephanie Olsen to remain in contact with cancer genetics annually so that we can continuously update the family history and inform her of any changes in cancer genetics and testing that may be of benefit for this family.   Ms.  Olsen questions were answered to her satisfaction today. Our contact information was provided should additional questions or concerns  arise. Thank you for the referral and allowing [place] to share in the care of your patient.   Guerin Lashomb P. Florene Glen, [place], [facility] Certified Genetic Counselor [email or web address] phone: [phone]  The patient was seen for a total of 60 minutes in face-to-face genetic counseling.  This patient was discussed with Drs. Magrinat, Lindi Adie and/or Burr Medico who agrees with the above.    _______________________________________________________________________ For Office Staff:  Number of people involved in session: 2 Was an Intern/ student involved with case: no

## 2014-04-09 ENCOUNTER — Other Ambulatory Visit (HOSPITAL_COMMUNITY): Payer: Self-pay | Admitting: Hematology and Oncology

## 2014-04-09 ENCOUNTER — Encounter: Payer: Self-pay | Admitting: *Deleted

## 2014-04-09 ENCOUNTER — Other Ambulatory Visit: Payer: Self-pay | Admitting: Hematology and Oncology

## 2014-04-09 ENCOUNTER — Ambulatory Visit (HOSPITAL_COMMUNITY)
Admission: RE | Admit: 2014-04-09 | Discharge: 2014-04-09 | Disposition: A | Discharge status: Home / Self Care | Payer: Medicare Other | Source: Ambulatory Visit | Attending: Hematology and Oncology | Admitting: Hematology and Oncology

## 2014-04-09 ENCOUNTER — Other Ambulatory Visit: Payer: Medicare Other

## 2014-04-09 DIAGNOSIS — Z5189 Encounter for other specified aftercare: Secondary | ICD-10-CM

## 2014-04-09 DIAGNOSIS — C50411 Malignant neoplasm of upper-outer quadrant of right female breast: Secondary | ICD-10-CM | POA: Diagnosis not present

## 2014-04-09 DIAGNOSIS — I059 Rheumatic mitral valve disease, unspecified: Secondary | ICD-10-CM | POA: Diagnosis not present

## 2014-04-09 NOTE — Progress Notes (Signed)
  Echocardiogram 2D Echocardiogram has been performed.  Stephanie Olsen 04/09/2014, 1:08 PM

## 2014-04-10 ENCOUNTER — Ambulatory Visit (HOSPITAL_BASED_OUTPATIENT_CLINIC_OR_DEPARTMENT_OTHER): Payer: Medicare Other

## 2014-04-10 ENCOUNTER — Ambulatory Visit (HOSPITAL_BASED_OUTPATIENT_CLINIC_OR_DEPARTMENT_OTHER): Payer: Medicare Other | Admitting: Hematology and Oncology

## 2014-04-10 ENCOUNTER — Other Ambulatory Visit: Payer: Medicare Other

## 2014-04-10 ENCOUNTER — Telehealth: Payer: Self-pay | Admitting: Hematology and Oncology

## 2014-04-10 VITALS — BP 158/70 | HR 74 | Temp 98.2°F | Resp 19 | Ht 66.0 in | Wt 199.5 lb

## 2014-04-10 DIAGNOSIS — C50811 Malignant neoplasm of overlapping sites of right female breast: Secondary | ICD-10-CM

## 2014-04-10 DIAGNOSIS — Z5111 Encounter for antineoplastic chemotherapy: Secondary | ICD-10-CM

## 2014-04-10 DIAGNOSIS — C50411 Malignant neoplasm of upper-outer quadrant of right female breast: Secondary | ICD-10-CM

## 2014-04-10 MED ORDER — PALONOSETRON HCL INJECTION 0.25 MG/5ML
0.2500 mg | Freq: Once | INTRAVENOUS | Status: AC
Start: 2014-04-10 — End: 2014-04-10
  Administered 2014-04-10: 0.25 mg via INTRAVENOUS

## 2014-04-10 MED ORDER — DOXORUBICIN HCL CHEMO IV INJECTION 2 MG/ML
60.0000 mg/m2 | Freq: Once | INTRAVENOUS | Status: AC
  Administered 2014-04-10: 124 mg via INTRAVENOUS
  Filled 2014-04-10: qty 62

## 2014-04-10 MED ORDER — SODIUM CHLORIDE 0.9 % IJ SOLN
10.0000 mL | INTRAMUSCULAR | Status: DC | PRN
  Administered 2014-04-10: 10 mL
  Filled 2014-04-10: qty 10

## 2014-04-10 MED ORDER — PALONOSETRON HCL INJECTION 0.25 MG/5ML
INTRAVENOUS | Status: AC
  Filled 2014-04-10: qty 5

## 2014-04-10 MED ORDER — UNABLE TO FIND: 1.0000 | Status: DC | PRN

## 2014-04-10 MED ORDER — HEPARIN SOD (PORK) LOCK FLUSH 100 UNIT/ML IV SOLN
500.0000 [IU] | Freq: Once | INTRAVENOUS | Status: AC | PRN
  Administered 2014-04-10: 500 [IU]
  Filled 2014-04-10: qty 5

## 2014-04-10 MED ORDER — SODIUM CHLORIDE 0.9 % IV SOLN
600.0000 mg/m2 | Freq: Once | INTRAVENOUS | Status: AC
  Administered 2014-04-10: 1240 mg via INTRAVENOUS
  Filled 2014-04-10: qty 62

## 2014-04-10 MED ORDER — LACTATED RINGERS IV SOLN
600.0000 mg/m2 | Freq: Once | INTRAVENOUS | Status: AC
  Administered 2014-04-10: 1230 mg via INTRAVENOUS
  Filled 2014-04-10: qty 123

## 2014-04-10 MED ORDER — DEXAMETHASONE SODIUM PHOSPHATE 20 MG/5ML IJ SOLN
12.0000 mg | Freq: Once | INTRAMUSCULAR | Status: AC
  Administered 2014-04-10: 12 mg via INTRAVENOUS

## 2014-04-10 MED ORDER — DEXAMETHASONE SODIUM PHOSPHATE 20 MG/5ML IJ SOLN
INTRAMUSCULAR | Status: AC
  Filled 2014-04-10: qty 5

## 2014-04-10 MED ORDER — SODIUM CHLORIDE 0.9 % IV SOLN
150.0000 mg | Freq: Once | INTRAVENOUS | Status: AC
  Administered 2014-04-10: 150 mg via INTRAVENOUS
  Filled 2014-04-10: qty 5

## 2014-04-10 MED ORDER — SODIUM CHLORIDE 0.9 % IV SOLN
Freq: Once | INTRAVENOUS | Status: AC
  Administered 2014-04-10: 11:00:00 via INTRAVENOUS

## 2014-04-10 NOTE — Progress Notes (Signed)
Patient Care Team: Laurey Morale, MD as PCP - General Excell Seltzer, MD as Consulting Physician (General Surgery) Rulon Eisenmenger, MD as Consulting Physician (Hematology and Oncology) Rexene Edison, MD as Consulting Physician (Radiation Oncology) Trinda Pascal, NP as Nurse Practitioner (Nurse Practitioner)  DIAGNOSIS: Breast cancer of upper-outer quadrant of right female breast   Staging form: Breast, AJCC 7th Edition     Clinical stage from 04/01/2014: Stage IIB (T3, N0, M0) - Unsigned       Staging comments: Staged at breast conference on 12.2.15    SUMMARY OF ONCOLOGIC HISTORY:   Breast cancer of upper-outer quadrant of right female breast   03/18/2014 Mammogram Right breast suspicious mass 10:00 4.9 x 3 x 5 cm, 2 lower right axillary lymph nodes mildly thickened cortices   03/23/2014 Initial Biopsy Right breast needle biopsy 9:00: Invasive ductal carcinoma grade 3, ER PR HER-2 negative ratio 1.3   03/31/2014 Breast MRI Right breast mass 9:00 upper outer quadrant with contiguous skin involvement by 5.7 cm, right axillary lymph nodes normal in size   04/10/2014 -  Neo-Adjuvant Chemotherapy Dose dense Adriamycin and Cytoxan 4 followed by weekly Taxol 12    CHIEF COMPLIANT: Cycle 1 day 1 of Adriamycin Cytoxan  INTERVAL HISTORY: Stephanie Olsen is a 68 year old Caucasian lady with above-mentioned history of right-sided triple negative breast cancer who is here to start first cycle of neoadjuvant chemotherapy with Adriamycin and Cytoxan. She had an echocardiogram which revealed an ejection fraction of 40-45%. She denies any shortness of breath or palpitations. Apparently previous EKGs have been normal. She is complaining of skin itching related to Betadine was applied for the port placement.  REVIEW OF SYSTEMS:   Constitutional: Denies fevers, chills or abnormal weight loss Eyes: Denies blurriness of vision Ears, nose, mouth, throat, and face: Denies mucositis or sore  throat Respiratory: Denies cough, dyspnea or wheezes Cardiovascular: Denies palpitation, chest discomfort or lower extremity swelling Gastrointestinal:  Denies nausea, heartburn or change in bowel habits Skin: Denies abnormal skin rashes Lymphatics: Denies new lymphadenopathy or easy bruising Neurological:Denies numbness, tingling or new weaknesses Behavioral/Psych: Mood is stable, no new changes  Breast:  denies any pain or lumps or nodules in either breasts All other systems were reviewed with the patient and are negative.  I have reviewed the past medical history, past surgical history, social history and family history with the patient and they are unchanged from previous note.  ALLERGIES:  has No Known Allergies.  MEDICATIONS:  Current Outpatient Prescriptions  Medication Sig Dispense Refill  . Ascorbic Acid (VITAMIN C) 500 MG tablet Take 500 mg by mouth 2 (two) times daily.     Marland Kitchen aspirin 81 MG tablet Take 81 mg by mouth every morning.     . beta carotene w/minerals (OCUVITE) tablet Take 1 tablet by mouth every morning.     . Calcium Carb-Cholecalciferol (CALCIUM 600/VITAMIN D3) 600-800 MG-UNIT TABS Take 1 tablet by mouth every morning.    . cetirizine (ZYRTEC) 10 MG tablet Take 10 mg by mouth every morning.     . clonazePAM (KLONOPIN) 2 MG tablet Take 1 tablet (2 mg total) by mouth 2 (two) times daily as needed for anxiety. (Patient taking differently: Take 1 mg by mouth 2 (two) times daily as needed for anxiety. ) 180 tablet 1  . cyclobenzaprine (FLEXERIL) 10 MG tablet Take 1 tablet (10 mg total) by mouth 3 (three) times daily as needed for muscle spasms. For spasms 90 tablet 5  .  dexamethasone (DECADRON) 4 MG tablet Take 2 tablets by mouth once a day on the day after chemotherapy and then take 2 tablets two times a day for 2 days. Take with food. 30 tablet 1  . fish oil-omega-3 fatty acids 1000 MG capsule Take 1 g by mouth every morning.     . fluticasone (FLONASE) 50 MCG/ACT nasal  spray Place 2 sprays into the nose daily. (Patient taking differently: Place 1 spray into the nose 2 (two) times daily. ) 48 g 3  . HYDROcodone-acetaminophen (NORCO/VICODIN) 5-325 MG per tablet Take 1-2 tablets by mouth every 4 (four) hours as needed for moderate pain or severe pain. 25 tablet 0  . ibuprofen (ADVIL,MOTRIN) 200 MG tablet Take 200 mg by mouth every 6 (six) hours as needed for headache or mild pain.    Marland Kitchen latanoprost (XALATAN) 0.005 % ophthalmic solution Place 1 drop into both eyes at bedtime.    . lidocaine-prilocaine (EMLA) cream Apply to affected area once (Patient taking differently: Apply 1 application topically as needed (for port). ) 30 g 3  . lisinopril (PRINIVIL,ZESTRIL) 10 MG tablet Take 1 tablet (10 mg total) by mouth daily. (Patient taking differently: Take 10 mg by mouth at bedtime. ) 90 tablet 3  . LORazepam (ATIVAN) 0.5 MG tablet Take 1 tablet (0.5 mg total) by mouth every 6 (six) hours as needed (Nausea or vomiting). 30 tablet 0  . Magnesium 500 MG TABS Take 1 tablet by mouth at bedtime.    . metroNIDAZOLE (METROGEL) 0.75 % gel Apply 1 application topically 2 (two) times daily as needed (rosacea on face.).     Marland Kitchen ondansetron (ZOFRAN) 8 MG tablet Take 1 tablet (8 mg total) by mouth 2 (two) times daily as needed. Start on the third day after chemotherapy. 30 tablet 1  . prochlorperazine (COMPAZINE) 10 MG tablet Take 1 tablet (10 mg total) by mouth every 6 (six) hours as needed (Nausea or vomiting). 30 tablet 1  . timolol (BETIMOL) 0.5 % ophthalmic solution Place 1 drop into both eyes every morning.    Marland Kitchen UNABLE TO FIND Apply 1 each topically as needed. 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 1 each 1   No current facility-administered medications for this visit.    PHYSICAL EXAMINATION: ECOG PERFORMANCE STATUS: 0 - Asymptomatic  Filed Vitals:   04/10/14 0945  BP: 158/70  Pulse: 74  Temp: 98.2 F (36.8 C)  Resp: 19    Filed Weights   04/10/14 0945  Weight: 199 lb 8 oz (90.493 kg)    GENERAL:alert, no distress and comfortable SKIN: skin color, texture, turgor are normal, no rashes or significant lesions EYES: normal, Conjunctiva are pink and non-injected, sclera clear OROPHARYNX:no exudate, no erythema and lips, buccal mucosa, and tongue normal  NECK: supple, thyroid normal size, non-tender, without nodularity LYMPH:  no palpable lymphadenopathy in the cervical, axillary or inguinal LUNGS: clear to auscultation and percussion with normal breathing effort HEART: regular rate & rhythm and no murmurs and no lower extremity edema ABDOMEN:abdomen soft, non-tender and normal bowel sounds Musculoskeletal:no cyanosis of digits and no clubbing  NEURO: alert & oriented x 3 with fluent speech, no focal motor/sensory deficits  LABORATORY DATA:  I have reviewed the data as listed   Chemistry      Component Value Date/Time   NA 137 04/08/2014 1235   NA 134* 02/12/2014 1108   K 4.3 04/08/2014 1235   K 4.7 02/12/2014 1108   CL 100  02/12/2014 1108   CO2 25 04/08/2014 1235   CO2 25 02/12/2014 1108   BUN 14.2 04/08/2014 1235   BUN 10 02/12/2014 1108   CREATININE 0.8 04/08/2014 1235   CREATININE 0.8 02/12/2014 1108      Component Value Date/Time   CALCIUM 9.3 04/08/2014 1235   CALCIUM 9.5 02/12/2014 1108   ALKPHOS 80 04/08/2014 1235   ALKPHOS 71 02/12/2014 1108   AST 19 04/08/2014 1235   AST 22 02/12/2014 1108   ALT 19 04/08/2014 1235   ALT 22 02/12/2014 1108   BILITOT 0.54 04/08/2014 1235   BILITOT 0.5 02/12/2014 1108       Lab Results  Component Value Date   WBC 8.6 04/08/2014   HGB 12.8 04/08/2014   HCT 39.3 04/08/2014   MCV 88.4 04/08/2014   PLT 282 04/08/2014   NEUTROABS 4.7 04/08/2014     RADIOGRAPHIC STUDIES: I have personally reviewed the radiology reports and agreed with their findings. No results found.   ASSESSMENT & PLAN:  Breast cancer of upper-outer quadrant of right  female breast Right breast invasive ductal carcinoma, palpable mass, 5 cm by mammogram and 5.7 cm by MRI grade 3, ER PR HER-2 negative, associated skin dimpling, T3, N0, M0 stage IIB Ki-67 90%  Current treatment: Patient will start neoadjuvant chemotherapy with dose dense Adriamycin and Cytoxan today. Plan is for 4 cycles followed by weekly Taxol x12 Counseled extensively regarding antiemetics. I decreased the dosage of Decadron to 1 tablet instead of 2 tablets twice a day.  Decreased ejection fraction: echocardiogram revealed an EF of 40-45%. Patient will need to see cardiology for further evaluation. Because of rapidly progressing cancer, I recommended initiating chemotherapy with Adriamycin in combination with Dexrazaxone (Zinecard).  Return to clinic in one week for toxicity check. Patient is very anxious because her husband died 7 months ago with multiple myeloma having been treated for about 7-8 years. Her starting chemotherapy today is bringing those anxiety feelings back.     Orders Placed This Encounter  Procedures  . CBC with Differential    Standing Status: Future     Number of Occurrences:      Standing Expiration Date: 04/10/2015  . Comprehensive metabolic panel (Cmet) - [facility]    Standing Status: Future     Number of Occurrences:      Standing Expiration Date: 04/10/2015  . Ambulatory referral to Cardiology    Referral Priority:  Routine    Referral Type:  Consultation    Referral Reason:  Specialty Services Required    Referred to Provider:  Jolaine Artist, MD    Requested Specialty:  Cardiology    Number of Visits Requested:  1   The patient has a good understanding of the overall plan. she agrees with it. She will call with any problems that may develop before her next visit here.   Rulon Eisenmenger, MD 04/10/2014 10:20 AM

## 2014-04-10 NOTE — Assessment & Plan Note (Addendum)
Right breast invasive ductal carcinoma, palpable mass, 5 cm by mammogram and 5.7 cm by MRI grade 3, ER PR HER-2 negative, associated skin dimpling, T3, N0, M0 stage IIB Ki-67 90%  Current treatment: Patient will start neoadjuvant chemotherapy with dose dense Adriamycin and Cytoxan today. Plan is for 4 cycles followed by weekly Taxol x12 Counseled extensively regarding antiemetics. I decreased the dosage of Decadron to 1 tablet instead of 2 tablets twice a day.  Decreased ejection fraction: echocardiogram revealed an EF of 40-45%. Patient will need to see cardiology for further evaluation. Because of rapidly progressing cancer, I recommended initiating chemotherapy with Adriamycin in combination with Dexrazaxone (Zinecard).  Return to clinic in one week for toxicity check.

## 2014-04-10 NOTE — Telephone Encounter (Signed)
, °

## 2014-04-10 NOTE — Patient Instructions (Signed)
[facility] Discharge Instructions for Patients Receiving Chemotherapy  Today you received the following chemotherapy agents: Adriamycin, Cytoxan.  To help prevent nausea and vomiting after your treatment, we encourage you to take your nausea medication: Compazine 10 mg every 6 hours as needed; Lorazepam as needed.   If you develop nausea and vomiting that is not controlled by your nausea medication, call the clinic.   BELOW ARE SYMPTOMS THAT SHOULD BE REPORTED IMMEDIATELY:  *FEVER GREATER THAN 100.5 F  *CHILLS WITH OR WITHOUT FEVER  NAUSEA AND VOMITING THAT IS NOT CONTROLLED WITH YOUR NAUSEA MEDICATION  *UNUSUAL SHORTNESS OF BREATH  *UNUSUAL BRUISING OR BLEEDING  TENDERNESS IN MOUTH AND THROAT WITH OR WITHOUT PRESENCE OF ULCERS  *URINARY PROBLEMS  *BOWEL PROBLEMS     Dexrazoxane injection What is this medicine? DEXRAZOXANE (dex ray ZOX ane) is used to protect against damage caused by certain chemotherapy. This medicine may be used for other purposes; ask your health care provider or pharmacist if you have questions. COMMON BRAND NAME(S): Zinecard What should I tell my health care provider before I take this medicine? They need to know if you have any of these conditions: -bone marrow suppression -heart disease -kidney disease -an unusual or allergic reaction to dexrazoxane, other medicines, foods, dyes, or preservatives -pregnant or trying to get pregnant -breast-feeding How should I use this medicine? This medicine is for injection or infusion into a vein. It is given by a health care professional in a hospital or clinic setting. This medicine is given just before you are given your chemotherapy medicine. Talk to your pediatrician regarding the use of this medicine in children. Special care may be needed. Overdosage: If you think you have taken too much of this medicine contact a poison control center or emergency room at once. NOTE: This medicine is  only for you. Do not share this medicine with others. What if I miss a dose? This does not apply. What may interact with this medicine? Interactions are not expected. This list may not describe all possible interactions. Give your health care provider a list of all the medicines, herbs, non-prescription drugs, or dietary supplements you use. Also tell them if you smoke, drink alcohol, or use illegal drugs. Some items may interact with your medicine. What should I watch for while using this medicine? Your condition will be monitored carefully while you are receiving this medicine. This medicine may increase your risk of getting an infection. Stay away from people who are sick and anyone who has recently had a vaccine. Call your doctor or health care professional for advice if you get a fever, chills, or sore throat. Do not treat yourself. This medicine may increase your risk to bruise or bleed. Call your doctor or health care professional if you notice any unusual bleeding. What side effects may I notice from receiving this medicine? Side effects that you should report to your doctor or health care professional as soon as possible: -allergic reactions like skin rash, itching or hives, swelling of the face, lips, or tongue -fever, chills, or sore throat -mouth sores -pain at site where injected -unusual bleeding or bruising -unusually tired or weak -vomiting Side effects that usually do not require medical attention (report to your doctor or health care professional if they continue or are bothersome): -confusion -depression -diarrhea -hair loss -nausea This list may not describe all possible side effects. Call your doctor for medical advice about side effects. You may report side effects to FDA at   1-800-FDA-1088. Where should I keep my medicine? This drug is given in a hospital or clinic and will not be stored at home. NOTE: This sheet is a summary. It may not cover all possible  information. If you have questions about this medicine, talk to your doctor, pharmacist, or health care provider.  2015, Elsevier/Gold Standard. (2007-08-08 17:38:56)   UNUSUAL RASH Items with * indicate a potential emergency and should be followed up as soon as possible.  Feel free to call the clinic you have any questions or concerns. The clinic phone number is [phone].    

## 2014-04-11 ENCOUNTER — Ambulatory Visit (HOSPITAL_BASED_OUTPATIENT_CLINIC_OR_DEPARTMENT_OTHER): Payer: Medicare Other

## 2014-04-11 DIAGNOSIS — Z5189 Encounter for other specified aftercare: Secondary | ICD-10-CM | POA: Diagnosis not present

## 2014-04-11 DIAGNOSIS — C50811 Malignant neoplasm of overlapping sites of right female breast: Secondary | ICD-10-CM

## 2014-04-11 MED ORDER — PEGFILGRASTIM INJECTION 6 MG/0.6ML ~~LOC~~
6.0000 mg | PREFILLED_SYRINGE | Freq: Once | SUBCUTANEOUS | Status: AC
  Administered 2014-04-11: 6 mg via SUBCUTANEOUS
  Filled 2014-04-11: qty 0.6

## 2014-04-14 ENCOUNTER — Telehealth: Payer: Self-pay

## 2014-04-14 NOTE — Telephone Encounter (Signed)
Returned pt call.  Pt reports medicare will not pay for a wig.  Let pt know I could get her a voucher for the wig shop at [facility].  Pt asking for information re: [employer] for assistance.  Let pt know contact info for [facility] can be found in the binder given to her with the black and pink breast center bag.  Pt voiced understanding.

## 2014-04-17 ENCOUNTER — Telehealth: Payer: Self-pay | Admitting: Hematology and Oncology

## 2014-04-17 ENCOUNTER — Ambulatory Visit (HOSPITAL_BASED_OUTPATIENT_CLINIC_OR_DEPARTMENT_OTHER): Payer: Medicare Other | Admitting: Hematology and Oncology

## 2014-04-17 ENCOUNTER — Telehealth: Payer: Self-pay

## 2014-04-17 ENCOUNTER — Telehealth: Payer: Self-pay | Admitting: *Deleted

## 2014-04-17 ENCOUNTER — Other Ambulatory Visit (HOSPITAL_BASED_OUTPATIENT_CLINIC_OR_DEPARTMENT_OTHER): Payer: Medicare Other

## 2014-04-17 VITALS — BP 128/61 | HR 80 | Temp 98.2°F | Resp 18 | Ht 66.0 in | Wt 198.5 lb

## 2014-04-17 DIAGNOSIS — D701 Agranulocytosis secondary to cancer chemotherapy: Secondary | ICD-10-CM

## 2014-04-17 DIAGNOSIS — C50411 Malignant neoplasm of upper-outer quadrant of right female breast: Secondary | ICD-10-CM

## 2014-04-17 LAB — COMPREHENSIVE METABOLIC PANEL (CC13)
ALT: 27 U/L (ref 0–55)
ANION GAP: 9 meq/L (ref 3–11)
AST: 18 U/L (ref 5–34)
Albumin: 3.3 g/dL — ABNORMAL LOW (ref 3.5–5.0)
Alkaline Phosphatase: 86 U/L (ref 40–150)
BILIRUBIN TOTAL: 0.74 mg/dL (ref 0.20–1.20)
BUN: 16.1 mg/dL (ref 7.0–26.0)
CHLORIDE: 100 meq/L (ref 98–109)
CO2: 23 meq/L (ref 22–29)
CREATININE: 0.7 mg/dL (ref 0.6–1.1)
Calcium: 8.8 mg/dL (ref 8.4–10.4)
EGFR: 87 mL/min/{1.73_m2} — ABNORMAL LOW (ref >90)
Glucose: 103 mg/dl (ref 70–140)
Potassium: 4.3 mEq/L (ref 3.5–5.1)
SODIUM: 131 meq/L — AB (ref 136–145)
TOTAL PROTEIN: 6.1 g/dL — AB (ref 6.4–8.3)

## 2014-04-17 LAB — CBC WITH DIFFERENTIAL/PLATELET
BASO%: 0 % (ref 0.0–2.0)
Basophils Absolute: 0 10*3/uL (ref 0.0–0.1)
EOS%: 6.6 % (ref 0.0–7.0)
Eosinophils Absolute: 0.1 10*3/uL (ref 0.0–0.5)
HCT: 36 % (ref 34.8–46.6)
HGB: 12.3 g/dL (ref 11.6–15.9)
LYMPH#: 1.2 10*3/uL (ref 0.9–3.3)
LYMPH%: 86.1 % — ABNORMAL HIGH (ref 14.0–49.7)
MCH: 29.4 pg (ref 25.1–34.0)
MCHC: 34.2 g/dL (ref 31.5–36.0)
MCV: 85.9 fL (ref 79.5–101.0)
MONO#: 0.1 10*3/uL (ref 0.1–0.9)
MONO%: 5.8 % (ref 0.0–14.0)
NEUT#: 0 10*3/uL — CL (ref 1.5–6.5)
NEUT%: 1.5 % — ABNORMAL LOW (ref 38.4–76.8)
Platelets: 152 10*3/uL (ref 145–400)
RBC: 4.19 10*6/uL (ref 3.70–5.45)
RDW: 13.6 % (ref 11.2–14.5)
WBC: 1.4 10*3/uL — AB (ref 3.9–10.3)

## 2014-04-17 NOTE — Telephone Encounter (Signed)
per pof to sch pt appt-gave pt copy of sch °

## 2014-04-17 NOTE — Telephone Encounter (Signed)
Follow up on CV referral - appt made for Thursday 1/7 at 1140.  Advised pt appt d/t and provided MD phone number if directions needed.  Pt voiced understanding.

## 2014-04-17 NOTE — Assessment & Plan Note (Addendum)
Right breast invasive ductal carcinoma, palpable mass, 5 cm by mammogram and 5.7 cm by MRI grade 3, ER PR HER-2 negative, associated skin dimpling, T3, N0, M0 stage IIB Ki-67 90%  Current treatment: Started neoadjuvant chemotherapy with dose dense Adriamycin and Cytoxan 04/10/2014. Plan is for 4 cycles followed by weekly Taxol x12  Decreased ejection fraction: echocardiogram revealed an EF of 40-45%. chemotherapy with Adriamycin in combination with Dexrazaxone (Zinecard) was given in order to protect the heart. Cardiology referral made.  Chemotherapy-related toxicities: 1. Chemotherapy induced neutropenia grade 3: We'll reduce the dosage of Chemotherapy 2. Nausea grade 1 3. Loss of taste 4. Decreased appetite  Monitoring closely for chemotoxicities.  Return to clinic in 1 week for cycle 2 of chemotherapy.

## 2014-04-17 NOTE — Telephone Encounter (Signed)
I have adjusted 12/24 appt

## 2014-04-17 NOTE — Telephone Encounter (Signed)
cld & left pt a message to adv that inf was moved up per her req

## 2014-04-17 NOTE — Progress Notes (Signed)
Patient Care Team: Laurey Morale, MD as PCP - General Excell Seltzer, MD as Consulting Physician (General Surgery) Rulon Eisenmenger, MD as Consulting Physician (Hematology and Oncology) Rexene Edison, MD as Consulting Physician (Radiation Oncology) Trinda Pascal, NP as Nurse Practitioner (Nurse Practitioner)  DIAGNOSIS: Breast cancer of upper-outer quadrant of right female breast   Staging form: Breast, AJCC 7th Edition     Clinical stage from 04/01/2014: Stage IIB (T3, N0, M0) - Unsigned       Staging comments: Staged at breast conference on 12.2.15    SUMMARY OF ONCOLOGIC HISTORY:   Breast cancer of upper-outer quadrant of right female breast   03/18/2014 Mammogram Right breast suspicious mass 10:00 4.9 x 3 x 5 cm, 2 lower right axillary lymph nodes mildly thickened cortices   03/23/2014 Initial Biopsy Right breast needle biopsy 9:00: Invasive ductal carcinoma grade 3, ER PR HER-2 negative ratio 1.3   03/31/2014 Breast MRI Right breast mass 9:00 upper outer quadrant with contiguous skin involvement by 5.7 cm, right axillary lymph nodes normal in size   04/10/2014 -  Neo-Adjuvant Chemotherapy Dose dense Adriamycin and Cytoxan 4 followed by weekly Taxol 12    CHIEF COMPLIANT: Cycle 1 day 8 Adriamycin and Cytoxan  INTERVAL HISTORY: Stephanie Olsen is a 68 year old Caucasian with above-mentioned history of right-sided breast cancer who is on neoadjuvant chemotherapy with dose dense Adriamycin and Cytoxan. She received cycle 1 of treatment week ago and is here for needle count check. She did not have any major nausea or vomiting. She did have mild queasiness for which he took nausea medicines. She did develop diarrhea 4 she took an Imodium and then developed constipation. She feels slightly tired but overall has good energy levels. She denies any fevers or chills.  REVIEW OF SYSTEMS:   Constitutional: Denies fevers, chills or abnormal weight loss Eyes: Denies blurriness of  vision Ears, nose, mouth, throat, and face: Denies mucositis or sore throat Respiratory: Denies cough, dyspnea or wheezes Cardiovascular: Denies palpitation, chest discomfort or lower extremity swelling Gastrointestinal:  Denies nausea, heartburn or change in bowel habits Skin: Denies abnormal skin rashes Lymphatics: Denies new lymphadenopathy or easy bruising Neurological:Denies numbness, tingling or new weaknesses Behavioral/Psych: Mood is stable, no new changes  All other systems were reviewed with the patient and are negative.  I have reviewed the past medical history, past surgical history, social history and family history with the patient and they are unchanged from previous note.  ALLERGIES:  has No Known Allergies.  MEDICATIONS:  Current Outpatient Prescriptions  Medication Sig Dispense Refill  . Ascorbic Acid (VITAMIN C) 500 MG tablet Take 500 mg by mouth 2 (two) times daily.     Marland Kitchen aspirin 81 MG tablet Take 81 mg by mouth every morning.     . beta carotene w/minerals (OCUVITE) tablet Take 1 tablet by mouth every morning.     . Calcium Carb-Cholecalciferol (CALCIUM 600/VITAMIN D3) 600-800 MG-UNIT TABS Take 1 tablet by mouth every morning.    . cetirizine (ZYRTEC) 10 MG tablet Take 10 mg by mouth every morning.     . clonazePAM (KLONOPIN) 2 MG tablet Take 1 tablet (2 mg total) by mouth 2 (two) times daily as needed for anxiety. (Patient taking differently: Take 1 mg by mouth 2 (two) times daily as needed for anxiety. ) 180 tablet 1  . cyclobenzaprine (FLEXERIL) 10 MG tablet Take 1 tablet (10 mg total) by mouth 3 (three) times daily as needed for muscle spasms.  For spasms 90 tablet 5  . dexamethasone (DECADRON) 4 MG tablet Take 2 tablets by mouth once a day on the day after chemotherapy and then take 2 tablets two times a day for 2 days. Take with food. 30 tablet 1  . fish oil-omega-3 fatty acids 1000 MG capsule Take 1 g by mouth every morning.     . fluticasone (FLONASE) 50 MCG/ACT  nasal spray Place 2 sprays into the nose daily. (Patient taking differently: Place 1 spray into the nose 2 (two) times daily. ) 48 g 3  . HYDROcodone-acetaminophen (NORCO/VICODIN) 5-325 MG per tablet Take 1-2 tablets by mouth every 4 (four) hours as needed for moderate pain or severe pain. 25 tablet 0  . ibuprofen (ADVIL,MOTRIN) 200 MG tablet Take 200 mg by mouth every 6 (six) hours as needed for headache or mild pain.    Marland Kitchen ketorolac (ACULAR) 0.5 % ophthalmic solution   0  . latanoprost (XALATAN) 0.005 % ophthalmic solution Place 1 drop into both eyes at bedtime.    . lidocaine-prilocaine (EMLA) cream Apply to affected area once (Patient taking differently: Apply 1 application topically as needed (for port). ) 30 g 3  . lisinopril (PRINIVIL,ZESTRIL) 10 MG tablet Take 1 tablet (10 mg total) by mouth daily. (Patient taking differently: Take 10 mg by mouth at bedtime. ) 90 tablet 3  . LORazepam (ATIVAN) 0.5 MG tablet Take 1 tablet (0.5 mg total) by mouth every 6 (six) hours as needed (Nausea or vomiting). 30 tablet 0  . Magnesium 500 MG TABS Take 1 tablet by mouth at bedtime.    . metoCLOPramide (REGLAN) 10 MG tablet   5  . metroNIDAZOLE (METROGEL) 0.75 % gel Apply 1 application topically 2 (two) times daily as needed (rosacea on face.).     Marland Kitchen ondansetron (ZOFRAN) 8 MG tablet Take 1 tablet (8 mg total) by mouth 2 (two) times daily as needed. Start on the third day after chemotherapy. 30 tablet 1  . prochlorperazine (COMPAZINE) 10 MG tablet Take 1 tablet (10 mg total) by mouth every 6 (six) hours as needed (Nausea or vomiting). 30 tablet 1  . timolol (BETIMOL) 0.5 % ophthalmic solution Place 1 drop into both eyes every morning.    Marland Kitchen UNABLE TO FIND Apply 1 each topically as needed. 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 1 each 1   No current facility-administered medications for this visit.    PHYSICAL EXAMINATION: ECOG PERFORMANCE STATUS: 1 -  Symptomatic but completely ambulatory  Filed Vitals:   04/17/14 0909  BP: 128/61  Pulse: 80  Temp: 98.2 F (36.8 C)  Resp: 18   Filed Weights   04/17/14 0909  Weight: 198 lb 8 oz (90.039 kg)    GENERAL:alert, no distress and comfortable SKIN: skin color, texture, turgor are normal, no rashes or significant lesions EYES: normal, Conjunctiva are pink and non-injected, sclera clear OROPHARYNX:no exudate, no erythema and lips, buccal mucosa, and tongue normal  NECK: supple, thyroid normal size, non-tender, without nodularity LYMPH:  no palpable lymphadenopathy in the cervical, axillary or inguinal LUNGS: clear to auscultation and percussion with normal breathing effort HEART: regular rate & rhythm and no murmurs and no lower extremity edema ABDOMEN:abdomen soft, non-tender and normal bowel sounds Musculoskeletal:no cyanosis of digits and no clubbing  NEURO: alert & oriented x 3 with fluent speech, no focal motor/sensory deficits  LABORATORY DATA:  I have reviewed the data as listed   Chemistry  Component Value Date/Time   NA 137 04/08/2014 1235   NA 134* 02/12/2014 1108   K 4.3 04/08/2014 1235   K 4.7 02/12/2014 1108   CL 100 02/12/2014 1108   CO2 25 04/08/2014 1235   CO2 25 02/12/2014 1108   BUN 14.2 04/08/2014 1235   BUN 10 02/12/2014 1108   CREATININE 0.8 04/08/2014 1235   CREATININE 0.8 02/12/2014 1108      Component Value Date/Time   CALCIUM 9.3 04/08/2014 1235   CALCIUM 9.5 02/12/2014 1108   ALKPHOS 80 04/08/2014 1235   ALKPHOS 71 02/12/2014 1108   AST 19 04/08/2014 1235   AST 22 02/12/2014 1108   ALT 19 04/08/2014 1235   ALT 22 02/12/2014 1108   BILITOT 0.54 04/08/2014 1235   BILITOT 0.5 02/12/2014 1108       Lab Results  Component Value Date   WBC 1.4* 04/17/2014   HGB 12.3 04/17/2014   HCT 36.0 04/17/2014   MCV 85.9 04/17/2014   PLT 152 04/17/2014   NEUTROABS 0.0* 04/17/2014    ASSESSMENT & PLAN:  Breast cancer of upper-outer quadrant of  right female breast Right breast invasive ductal carcinoma, palpable mass, 5 cm by mammogram and 5.7 cm by MRI grade 3, ER PR HER-2 negative, associated skin dimpling, T3, N0, M0 stage IIB Ki-67 90%  Current treatment: Started neoadjuvant chemotherapy with dose dense Adriamycin and Cytoxan 04/10/2014. Plan is for 4 cycles followed by weekly Taxol x12  Decreased ejection fraction: echocardiogram revealed an EF of 40-45%. chemotherapy with Adriamycin in combination with Dexrazaxone (Zinecard) was given in order to protect the heart. Cardiology referral made.  Chemotherapy-related toxicities: 1. Chemotherapy induced neutropenia grade 3: We'll reduce the dosage of Chemotherapy 2. Nausea grade 1 3. Loss of taste 4. Decreased appetite  Monitoring closely for chemotoxicities.  Return to clinic in 1 week for cycle 2 of chemotherapy.   Orders Placed This Encounter  Procedures  . CBC with Differential    Standing Status: Future     Number of Occurrences:      Standing Expiration Date: 04/17/2015  . Comprehensive metabolic panel (Cmet) - [facility]    Standing Status: Future     Number of Occurrences:      Standing Expiration Date: 04/17/2015   The patient has a good understanding of the overall plan. she agrees with it. She will call with any problems that may develop before her next visit here.   Rulon Eisenmenger, MD 04/17/2014 10:14 AM

## 2014-04-23 ENCOUNTER — Ambulatory Visit (HOSPITAL_BASED_OUTPATIENT_CLINIC_OR_DEPARTMENT_OTHER): Payer: Medicare Other

## 2014-04-23 ENCOUNTER — Telehealth: Payer: Self-pay | Admitting: Hematology and Oncology

## 2014-04-23 ENCOUNTER — Ambulatory Visit (HOSPITAL_BASED_OUTPATIENT_CLINIC_OR_DEPARTMENT_OTHER): Payer: Medicare Other | Admitting: Hematology and Oncology

## 2014-04-23 ENCOUNTER — Other Ambulatory Visit (HOSPITAL_BASED_OUTPATIENT_CLINIC_OR_DEPARTMENT_OTHER): Payer: Medicare Other

## 2014-04-23 VITALS — BP 126/42 | HR 85 | Temp 98.6°F | Resp 18 | Ht 66.0 in | Wt 195.6 lb

## 2014-04-23 DIAGNOSIS — Z171 Estrogen receptor negative status [ER-]: Secondary | ICD-10-CM

## 2014-04-23 DIAGNOSIS — C50411 Malignant neoplasm of upper-outer quadrant of right female breast: Secondary | ICD-10-CM

## 2014-04-23 DIAGNOSIS — Z5111 Encounter for antineoplastic chemotherapy: Secondary | ICD-10-CM

## 2014-04-23 LAB — COMPREHENSIVE METABOLIC PANEL (CC13)
ALT: 19 U/L (ref 0–55)
AST: 16 U/L (ref 5–34)
Albumin: 3.2 g/dL — ABNORMAL LOW (ref 3.5–5.0)
Alkaline Phosphatase: 89 U/L (ref 40–150)
Anion Gap: 10 mEq/L (ref 3–11)
BUN: 7 mg/dL (ref 7.0–26.0)
CALCIUM: 9 mg/dL (ref 8.4–10.4)
CHLORIDE: 102 meq/L (ref 98–109)
CO2: 26 mEq/L (ref 22–29)
Creatinine: 0.8 mg/dL (ref 0.6–1.1)
EGFR: 79 mL/min/{1.73_m2} — AB (ref >90)
GLUCOSE: 95 mg/dL (ref 70–140)
Potassium: 3.9 mEq/L (ref 3.5–5.1)
Sodium: 137 mEq/L (ref 136–145)
Total Protein: 6.5 g/dL (ref 6.4–8.3)

## 2014-04-23 LAB — CBC WITH DIFFERENTIAL/PLATELET
BASO%: 0.5 % (ref 0.0–2.0)
BASOS ABS: 0.1 10*3/uL (ref 0.0–0.1)
EOS ABS: 0 10*3/uL (ref 0.0–0.5)
EOS%: 0.3 % (ref 0.0–7.0)
HEMATOCRIT: 35.2 % (ref 34.8–46.6)
HEMOGLOBIN: 11.8 g/dL (ref 11.6–15.9)
LYMPH%: 23.2 % (ref 14.0–49.7)
MCH: 29.1 pg (ref 25.1–34.0)
MCHC: 33.5 g/dL (ref 31.5–36.0)
MCV: 86.7 fL (ref 79.5–101.0)
MONO#: 1.1 10*3/uL — ABNORMAL HIGH (ref 0.1–0.9)
MONO%: 11.8 % (ref 0.0–14.0)
NEUT#: 5.9 10*3/uL (ref 1.5–6.5)
NEUT%: 64.2 % (ref 38.4–76.8)
PLATELETS: 367 10*3/uL (ref 145–400)
RBC: 4.06 10*6/uL (ref 3.70–5.45)
RDW: 13.4 % (ref 11.2–14.5)
WBC: 9.2 10*3/uL (ref 3.9–10.3)
lymph#: 2.1 10*3/uL (ref 0.9–3.3)

## 2014-04-23 MED ORDER — SODIUM CHLORIDE 0.9 % IV SOLN
Freq: Once | INTRAVENOUS | Status: AC
  Administered 2014-04-23: 10:00:00 via INTRAVENOUS

## 2014-04-23 MED ORDER — SODIUM CHLORIDE 0.9 % IJ SOLN
10.0000 mL | INTRAMUSCULAR | Status: DC | PRN
  Administered 2014-04-23: 10 mL
  Filled 2014-04-23: qty 10

## 2014-04-23 MED ORDER — DOXORUBICIN HCL CHEMO IV INJECTION 2 MG/ML
50.0000 mg/m2 | Freq: Once | INTRAVENOUS | Status: AC
  Administered 2014-04-23: 102 mg via INTRAVENOUS
  Filled 2014-04-23: qty 51

## 2014-04-23 MED ORDER — PALONOSETRON HCL INJECTION 0.25 MG/5ML
INTRAVENOUS | Status: AC
  Filled 2014-04-23: qty 5

## 2014-04-23 MED ORDER — LACTATED RINGERS IV SOLN
499.0000 mg/m2 | Freq: Once | INTRAVENOUS | Status: AC
  Administered 2014-04-23: 1020 mg via INTRAVENOUS
  Filled 2014-04-23: qty 102

## 2014-04-23 MED ORDER — PALONOSETRON HCL INJECTION 0.25 MG/5ML
0.2500 mg | Freq: Once | INTRAVENOUS | Status: AC
  Administered 2014-04-23: 0.25 mg via INTRAVENOUS

## 2014-04-23 MED ORDER — NYSTATIN 100000 UNIT/ML MT SUSP
5.0000 mL | Freq: Four times a day (QID) | OROMUCOSAL | Status: DC
Start: 2014-04-23 — End: 2014-09-23

## 2014-04-23 MED ORDER — HEPARIN SOD (PORK) LOCK FLUSH 100 UNIT/ML IV SOLN
500.0000 [IU] | Freq: Once | INTRAVENOUS | Status: AC | PRN
  Administered 2014-04-23: 500 [IU]
  Filled 2014-04-23: qty 5

## 2014-04-23 MED ORDER — SODIUM CHLORIDE 0.9 % IV SOLN
500.0000 mg/m2 | Freq: Once | INTRAVENOUS | Status: AC
  Administered 2014-04-23: 1020 mg via INTRAVENOUS
  Filled 2014-04-23: qty 51

## 2014-04-23 MED ORDER — DEXAMETHASONE SODIUM PHOSPHATE 20 MG/5ML IJ SOLN
INTRAMUSCULAR | Status: AC
  Filled 2014-04-23: qty 5

## 2014-04-23 MED ORDER — DEXAMETHASONE SODIUM PHOSPHATE 20 MG/5ML IJ SOLN
12.0000 mg | Freq: Once | INTRAMUSCULAR | Status: AC
  Administered 2014-04-23: 12 mg via INTRAVENOUS

## 2014-04-23 MED ORDER — SODIUM CHLORIDE 0.9 % IV SOLN
150.0000 mg | Freq: Once | INTRAVENOUS | Status: AC
  Administered 2014-04-23: 150 mg via INTRAVENOUS
  Filled 2014-04-23: qty 5

## 2014-04-23 MED ORDER — LACTATED RINGERS IV SOLN
499.0000 mg/m2 | Freq: Once | INTRAVENOUS | Status: DC
  Filled 2014-04-23: qty 102

## 2014-04-23 NOTE — Progress Notes (Signed)
Patient Care Team: Laurey Morale, MD as PCP - General Excell Seltzer, MD as Consulting Physician (General Surgery) Rulon Eisenmenger, MD as Consulting Physician (Hematology and Oncology) Rexene Edison, MD as Consulting Physician (Radiation Oncology) Trinda Pascal, NP as Nurse Practitioner (Nurse Practitioner)  DIAGNOSIS: Breast cancer of upper-outer quadrant of right female breast   Staging form: Breast, AJCC 7th Edition     Clinical stage from 04/01/2014: Stage IIB (T3, N0, M0) - Unsigned       Staging comments: Staged at breast conference on 12.2.15    SUMMARY OF ONCOLOGIC HISTORY:   Breast cancer of upper-outer quadrant of right female breast   03/18/2014 Mammogram Right breast suspicious mass 10:00 4.9 x 3 x 5 cm, 2 lower right axillary lymph nodes mildly thickened cortices   03/23/2014 Initial Biopsy Right breast needle biopsy 9:00: Invasive ductal carcinoma grade 3, ER PR HER-2 negative ratio 1.3   03/31/2014 Breast MRI Right breast mass 9:00 upper outer quadrant with contiguous skin involvement by 5.7 cm, right axillary lymph nodes normal in size   04/10/2014 -  Neo-Adjuvant Chemotherapy Dose dense Adriamycin and Cytoxan 4 followed by weekly Taxol 12    CHIEF COMPLIANT: Cycle 2 AC  INTERVAL HISTORY: Stephanie Olsen is a 68 year old lady with above-mentioned history of right-sided breast cancer currently on neoadjuvant chemotherapy. She is here today for cycle 2 of dose dense Adriamycin and Cytoxan. For the past week she had excellent energy levels. Since yesterday she has a bit of a cough but otherwise doing well. Denies any fevers or chills.  REVIEW OF SYSTEMS:   Constitutional: Denies fevers, chills or abnormal weight loss Eyes: Denies blurriness of vision Ears, nose, mouth, throat, and face: Denies mucositis or sore throat Respiratory: Denies cough, dyspnea or wheezes Cardiovascular: Denies palpitation, chest discomfort or lower extremity  swelling Gastrointestinal:  Denies nausea, heartburn or change in bowel habits Skin: Denies abnormal skin rashes Lymphatics: Denies new lymphadenopathy or easy bruising Neurological:Denies numbness, tingling or new weaknesses Behavioral/Psych: Mood is stable, no new changes  Breast:  denies any pain or lumps or nodules in either breasts All other systems were reviewed with the patient and are negative.  I have reviewed the past medical history, past surgical history, social history and family history with the patient and they are unchanged from previous note.  ALLERGIES:  has No Known Allergies.  MEDICATIONS:  Current Outpatient Prescriptions  Medication Sig Dispense Refill  . Ascorbic Acid (VITAMIN C) 500 MG tablet Take 500 mg by mouth 2 (two) times daily.     Marland Kitchen aspirin 81 MG tablet Take 81 mg by mouth every morning.     . beta carotene w/minerals (OCUVITE) tablet Take 1 tablet by mouth every morning.     . Calcium Carb-Cholecalciferol (CALCIUM 600/VITAMIN D3) 600-800 MG-UNIT TABS Take 1 tablet by mouth every morning.    . cetirizine (ZYRTEC) 10 MG tablet Take 10 mg by mouth every morning.     . clonazePAM (KLONOPIN) 2 MG tablet Take 1 tablet (2 mg total) by mouth 2 (two) times daily as needed for anxiety. (Patient taking differently: Take 1 mg by mouth 2 (two) times daily as needed for anxiety. ) 180 tablet 1  . cyclobenzaprine (FLEXERIL) 10 MG tablet Take 1 tablet (10 mg total) by mouth 3 (three) times daily as needed for muscle spasms. For spasms 90 tablet 5  . dexamethasone (DECADRON) 4 MG tablet Take 2 tablets by mouth once a day on the day  after chemotherapy and then take 2 tablets two times a day for 2 days. Take with food. 30 tablet 1  . fish oil-omega-3 fatty acids 1000 MG capsule Take 1 g by mouth every morning.     . fluticasone (FLONASE) 50 MCG/ACT nasal spray Place 2 sprays into the nose daily. (Patient taking differently: Place 1 spray into the nose 2 (two) times daily. ) 48 g  3  . HYDROcodone-acetaminophen (NORCO/VICODIN) 5-325 MG per tablet Take 1-2 tablets by mouth every 4 (four) hours as needed for moderate pain or severe pain. 25 tablet 0  . ibuprofen (ADVIL,MOTRIN) 200 MG tablet Take 200 mg by mouth every 6 (six) hours as needed for headache or mild pain.    Marland Kitchen ketorolac (ACULAR) 0.5 % ophthalmic solution   0  . latanoprost (XALATAN) 0.005 % ophthalmic solution Place 1 drop into both eyes at bedtime.    . lidocaine-prilocaine (EMLA) cream Apply to affected area once (Patient taking differently: Apply 1 application topically as needed (for port). ) 30 g 3  . lisinopril (PRINIVIL,ZESTRIL) 10 MG tablet Take 1 tablet (10 mg total) by mouth daily. (Patient taking differently: Take 10 mg by mouth at bedtime. ) 90 tablet 3  . LORazepam (ATIVAN) 0.5 MG tablet Take 1 tablet (0.5 mg total) by mouth every 6 (six) hours as needed (Nausea or vomiting). 30 tablet 0  . Magnesium 500 MG TABS Take 1 tablet by mouth at bedtime.    . metoCLOPramide (REGLAN) 10 MG tablet   5  . metroNIDAZOLE (METROGEL) 0.75 % gel Apply 1 application topically 2 (two) times daily as needed (rosacea on face.).     Marland Kitchen ondansetron (ZOFRAN) 8 MG tablet Take 1 tablet (8 mg total) by mouth 2 (two) times daily as needed. Start on the third day after chemotherapy. 30 tablet 1  . prochlorperazine (COMPAZINE) 10 MG tablet Take 1 tablet (10 mg total) by mouth every 6 (six) hours as needed (Nausea or vomiting). 30 tablet 1  . timolol (BETIMOL) 0.5 % ophthalmic solution Place 1 drop into both eyes every morning.    Marland Kitchen UNABLE TO FIND Apply 1 each topically as needed. 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 1 each 1  . nystatin (MYCOSTATIN) 100000 UNIT/ML suspension Take 5 mLs (500,000 Units total) by mouth 4 (four) times daily. 60 mL 0   No current facility-administered medications for this visit.   Facility-Administered Medications Ordered in Other Visits  Medication  Dose Route Frequency Provider Last Rate Last Dose  . 0.9 %  sodium chloride infusion   Intravenous Once Rulon Eisenmenger, MD      . cyclophosphamide (CYTOXAN) 1,020 mg in sodium chloride 0.9 % 250 mL chemo infusion  500 mg/m2 (Treatment Plan Actual) Intravenous Once Rulon Eisenmenger, MD      . Dexamethasone Sodium Phosphate (DECADRON) injection 12 mg  12 mg Intravenous Once Rulon Eisenmenger, MD      . dexrazoxane (ZINECARD) 1,030 mg in lactated ringers 500 mL IVPB  500 mg/m2 (Treatment Plan Actual) Intravenous Once Rulon Eisenmenger, MD      . DOXOrubicin (ADRIAMYCIN) chemo injection 102 mg  50 mg/m2 (Treatment Plan Actual) Intravenous Once Rulon Eisenmenger, MD      . fosaprepitant (EMEND) 150 mg in sodium chloride 0.9 % 145 mL IVPB  150 mg Intravenous Once Rulon Eisenmenger, MD      . heparin lock flush 100 unit/mL  500 Units Intracatheter Once PRN  Rulon Eisenmenger, MD      . palonosetron (ALOXI) injection 0.25 mg  0.25 mg Intravenous Once Rulon Eisenmenger, MD      . sodium chloride 0.9 % injection 10 mL  10 mL Intracatheter PRN Rulon Eisenmenger, MD        PHYSICAL EXAMINATION: ECOG PERFORMANCE STATUS: 1 - Symptomatic but completely ambulatory  Filed Vitals:   04/23/14 0830  BP: 126/42  Pulse: 85  Temp: 98.6 F (37 C)  Resp: 18   Filed Weights   04/23/14 0830  Weight: 195 lb 9.6 oz (88.724 kg)    GENERAL:alert, no distress and comfortable SKIN: skin color, texture, turgor are normal, no rashes or significant lesions EYES: normal, Conjunctiva are pink and non-injected, sclera clear OROPHARYNX:no exudate, no erythema and lips, buccal mucosa, and tongue normal  NECK: supple, thyroid normal size, non-tender, without nodularity LYMPH:  no palpable lymphadenopathy in the cervical, axillary or inguinal LUNGS: clear to auscultation and percussion with normal breathing effort HEART: regular rate & rhythm and no murmurs and no lower extremity edema ABDOMEN:abdomen soft, non-tender and normal bowel  sounds Musculoskeletal:no cyanosis of digits and no clubbing  NEURO: alert & oriented x 3 with fluent speech, no focal motor/sensory deficits  LABORATORY DATA:  I have reviewed the data as listed   Chemistry      Component Value Date/Time   NA 137 04/23/2014 0817   NA 134* 02/12/2014 1108   K 3.9 04/23/2014 0817   K 4.7 02/12/2014 1108   CL 100 02/12/2014 1108   CO2 26 04/23/2014 0817   CO2 25 02/12/2014 1108   BUN 7.0 04/23/2014 0817   BUN 10 02/12/2014 1108   CREATININE 0.8 04/23/2014 0817   CREATININE 0.8 02/12/2014 1108      Component Value Date/Time   CALCIUM 9.0 04/23/2014 0817   CALCIUM 9.5 02/12/2014 1108   ALKPHOS 89 04/23/2014 0817   ALKPHOS 71 02/12/2014 1108   AST 16 04/23/2014 0817   AST 22 02/12/2014 1108   ALT 19 04/23/2014 0817   ALT 22 02/12/2014 1108   BILITOT <0.20 04/23/2014 0817   BILITOT 0.5 02/12/2014 1108       Lab Results  Component Value Date   WBC 9.2 04/23/2014   HGB 11.8 04/23/2014   HCT 35.2 04/23/2014   MCV 86.7 04/23/2014   PLT 367 04/23/2014   NEUTROABS 5.9 04/23/2014   ASSESSMENT & PLAN:  Breast cancer of upper-outer quadrant of right female breast Right breast invasive ductal carcinoma, palpable mass, 5 cm by mammogram and 5.7 cm by MRI grade 3, ER PR HER-2 negative, associated skin dimpling, T3, N0, M0 stage IIB Ki-67 90%  Current treatment: Neoadjuvant chemotherapy with dose dense Adriamycin and Cytoxan 04/10/2014. Plan is for 4 cycles followed by weekly Taxol x12; today is cycle 2 of dose dense AC  Decreased ejection fraction (underlying cardiomyopathy): echocardiogram revealed an EF of 40-45%. chemotherapy with Adriamycin in combination with Dexrazaxone (Zinecard) was given in order to protect the heart. Cardiology referral made.  Chemotherapy-related toxicities: 1. Chemotherapy induced neutropenia grade 3: Chemotherapy dosage reduced for cycle 2 2. Nausea grade 1 3. Loss of taste 4. Decreased appetite  Monitoring  closely for chemotoxicities.  Return to clinic in 1 week for cycle 2 day 8 toxicity check with nurse practitioner.   Orders Placed This Encounter  Procedures  . CBC with Differential    Standing Status: Future     Number of Occurrences:  Standing Expiration Date: 04/23/2015   The patient has a good understanding of the overall plan. she agrees with it. She will call with any problems that may develop before her next visit here.   Rulon Eisenmenger, MD 04/23/2014 9:32 AM

## 2014-04-23 NOTE — Telephone Encounter (Signed)
per pof to sch pt appt-gave pt copy of sch °

## 2014-04-23 NOTE — Assessment & Plan Note (Signed)
Right breast invasive ductal carcinoma, palpable mass, 5 cm by mammogram and 5.7 cm by MRI grade 3, ER PR HER-2 negative, associated skin dimpling, T3, N0, M0 stage IIB Ki-67 90%  Current treatment: Neoadjuvant chemotherapy with dose dense Adriamycin and Cytoxan 04/10/2014. Plan is for 4 cycles followed by weekly Taxol x12; today is cycle 2 of dose dense AC  Decreased ejection fraction (underlying cardiomyopathy): echocardiogram revealed an EF of 40-45%. chemotherapy with Adriamycin in combination with Dexrazaxone (Zinecard) was given in order to protect the heart. Cardiology referral made.  Chemotherapy-related toxicities: 1. Chemotherapy induced neutropenia grade 3: Chemotherapy dosage reduced for cycle 2 2. Nausea grade 1 3. Loss of taste 4. Decreased appetite  Monitoring closely for chemotoxicities.  Return to clinic in 1 week for cycle 2 day 8 toxicity check with nurse practitioner.

## 2014-04-23 NOTE — Patient Instructions (Signed)
[facility] Discharge Instructions for Patients Receiving Chemotherapy  Today you received the following chemotherapy agents: Adriamycin, Cytoxan.  To help prevent nausea and vomiting after your treatment, we encourage you to take your nausea medication: Compazine 10 mg every 6 hours as needed; Lorazepam as needed.   If you develop nausea and vomiting that is not controlled by your nausea medication, call the clinic.   BELOW ARE SYMPTOMS THAT SHOULD BE REPORTED IMMEDIATELY:  *FEVER GREATER THAN 100.5 F  *CHILLS WITH OR WITHOUT FEVER  NAUSEA AND VOMITING THAT IS NOT CONTROLLED WITH YOUR NAUSEA MEDICATION  *UNUSUAL SHORTNESS OF BREATH  *UNUSUAL BRUISING OR BLEEDING  TENDERNESS IN MOUTH AND THROAT WITH OR WITHOUT PRESENCE OF ULCERS  *URINARY PROBLEMS  *BOWEL PROBLEMS     Dexrazoxane injection What is this medicine? DEXRAZOXANE (dex ray ZOX ane) is used to protect against damage caused by certain chemotherapy. This medicine may be used for other purposes; ask your health care provider or pharmacist if you have questions. COMMON BRAND NAME(S): Zinecard What should I tell my health care provider before I take this medicine? They need to know if you have any of these conditions: -bone marrow suppression -heart disease -kidney disease -an unusual or allergic reaction to dexrazoxane, other medicines, foods, dyes, or preservatives -pregnant or trying to get pregnant -breast-feeding How should I use this medicine? This medicine is for injection or infusion into a vein. It is given by a health care professional in a hospital or clinic setting. This medicine is given just before you are given your chemotherapy medicine. Talk to your pediatrician regarding the use of this medicine in children. Special care may be needed. Overdosage: If you think you have taken too much of this medicine contact a poison control center or emergency room at once. NOTE: This medicine is  only for you. Do not share this medicine with others. What if I miss a dose? This does not apply. What may interact with this medicine? Interactions are not expected. This list may not describe all possible interactions. Give your health care provider a list of all the medicines, herbs, non-prescription drugs, or dietary supplements you use. Also tell them if you smoke, drink alcohol, or use illegal drugs. Some items may interact with your medicine. What should I watch for while using this medicine? Your condition will be monitored carefully while you are receiving this medicine. This medicine may increase your risk of getting an infection. Stay away from people who are sick and anyone who has recently had a vaccine. Call your doctor or health care professional for advice if you get a fever, chills, or sore throat. Do not treat yourself. This medicine may increase your risk to bruise or bleed. Call your doctor or health care professional if you notice any unusual bleeding. What side effects may I notice from receiving this medicine? Side effects that you should report to your doctor or health care professional as soon as possible: -allergic reactions like skin rash, itching or hives, swelling of the face, lips, or tongue -fever, chills, or sore throat -mouth sores -pain at site where injected -unusual bleeding or bruising -unusually tired or weak -vomiting Side effects that usually do not require medical attention (report to your doctor or health care professional if they continue or are bothersome): -confusion -depression -diarrhea -hair loss -nausea This list may not describe all possible side effects. Call your doctor for medical advice about side effects. You may report side effects to FDA at  1-800-FDA-1088. Where should I keep my medicine? This drug is given in a hospital or clinic and will not be stored at home. NOTE: This sheet is a summary. It may not cover all possible  information. If you have questions about this medicine, talk to your doctor, pharmacist, or health care provider.  2015, Elsevier/Gold Standard. (2007-08-08 17:38:56)   UNUSUAL RASH Items with * indicate a potential emergency and should be followed up as soon as possible.  Feel free to call the clinic you have any questions or concerns. The clinic phone number is [phone].

## 2014-04-25 ENCOUNTER — Ambulatory Visit (HOSPITAL_BASED_OUTPATIENT_CLINIC_OR_DEPARTMENT_OTHER): Payer: Medicare Other

## 2014-04-25 VITALS — BP 122/53 | HR 80 | Temp 98.3°F | Resp 12

## 2014-04-25 DIAGNOSIS — C50811 Malignant neoplasm of overlapping sites of right female breast: Secondary | ICD-10-CM | POA: Diagnosis not present

## 2014-04-25 DIAGNOSIS — C50411 Malignant neoplasm of upper-outer quadrant of right female breast: Secondary | ICD-10-CM

## 2014-04-25 DIAGNOSIS — Z5189 Encounter for other specified aftercare: Secondary | ICD-10-CM | POA: Diagnosis not present

## 2014-04-25 MED ORDER — PEGFILGRASTIM INJECTION 6 MG/0.6ML ~~LOC~~
6.0000 mg | PREFILLED_SYRINGE | Freq: Once | SUBCUTANEOUS | Status: AC
  Administered 2014-04-25: 6 mg via SUBCUTANEOUS

## 2014-04-29 ENCOUNTER — Telehealth: Payer: Self-pay | Admitting: *Deleted

## 2014-04-29 ENCOUNTER — Ambulatory Visit (HOSPITAL_BASED_OUTPATIENT_CLINIC_OR_DEPARTMENT_OTHER): Payer: Medicare Other | Admitting: Hematology and Oncology

## 2014-04-29 ENCOUNTER — Telehealth: Payer: Self-pay | Admitting: Hematology and Oncology

## 2014-04-29 ENCOUNTER — Other Ambulatory Visit (HOSPITAL_BASED_OUTPATIENT_CLINIC_OR_DEPARTMENT_OTHER): Payer: Medicare Other

## 2014-04-29 VITALS — BP 116/53 | HR 92 | Temp 98.5°F | Resp 18 | Ht 66.0 in | Wt 195.3 lb

## 2014-04-29 DIAGNOSIS — D702 Other drug-induced agranulocytosis: Secondary | ICD-10-CM

## 2014-04-29 DIAGNOSIS — C50411 Malignant neoplasm of upper-outer quadrant of right female breast: Secondary | ICD-10-CM

## 2014-04-29 DIAGNOSIS — R110 Nausea: Secondary | ICD-10-CM | POA: Diagnosis not present

## 2014-04-29 DIAGNOSIS — R439 Unspecified disturbances of smell and taste: Secondary | ICD-10-CM | POA: Diagnosis not present

## 2014-04-29 DIAGNOSIS — R630 Anorexia: Secondary | ICD-10-CM

## 2014-04-29 DIAGNOSIS — C50811 Malignant neoplasm of overlapping sites of right female breast: Secondary | ICD-10-CM

## 2014-04-29 LAB — CBC WITH DIFFERENTIAL/PLATELET
BASO%: 1.2 % (ref 0.0–2.0)
Basophils Absolute: 0 10*3/uL (ref 0.0–0.1)
EOS%: 0.5 % (ref 0.0–7.0)
Eosinophils Absolute: 0 10*3/uL (ref 0.0–0.5)
HEMATOCRIT: 38.2 % (ref 34.8–46.6)
HEMOGLOBIN: 12.3 g/dL (ref 11.6–15.9)
LYMPH%: 35.5 % (ref 14.0–49.7)
MCH: 28.7 pg (ref 25.1–34.0)
MCHC: 32.3 g/dL (ref 31.5–36.0)
MCV: 89 fL (ref 79.5–101.0)
MONO#: 0.1 10*3/uL (ref 0.1–0.9)
MONO%: 3.1 % (ref 0.0–14.0)
NEUT#: 1 10*3/uL — ABNORMAL LOW (ref 1.5–6.5)
NEUT%: 59.7 % (ref 38.4–76.8)
PLATELETS: 305 10*3/uL (ref 145–400)
RBC: 4.29 10*6/uL (ref 3.70–5.45)
RDW: 14 % (ref 11.2–14.5)
WBC: 1.7 10*3/uL — ABNORMAL LOW (ref 3.9–10.3)
lymph#: 0.6 10*3/uL — ABNORMAL LOW (ref 0.9–3.3)

## 2014-04-29 LAB — COMPREHENSIVE METABOLIC PANEL (CC13)
ALK PHOS: 91 U/L (ref 40–150)
ALT: 31 U/L (ref 0–55)
ANION GAP: 8 meq/L (ref 3–11)
AST: 21 U/L (ref 5–34)
Albumin: 3.2 g/dL — ABNORMAL LOW (ref 3.5–5.0)
BILIRUBIN TOTAL: 0.52 mg/dL (ref 0.20–1.20)
BUN: 12.6 mg/dL (ref 7.0–26.0)
CO2: 28 meq/L (ref 22–29)
CREATININE: 0.8 mg/dL (ref 0.6–1.1)
Calcium: 9.2 mg/dL (ref 8.4–10.4)
Chloride: 101 mEq/L (ref 98–109)
EGFR: 79 mL/min/{1.73_m2} — ABNORMAL LOW (ref >90)
Glucose: 122 mg/dl (ref 70–140)
Potassium: 4.1 mEq/L (ref 3.5–5.1)
Sodium: 136 mEq/L (ref 136–145)
Total Protein: 6.1 g/dL — ABNORMAL LOW (ref 6.4–8.3)

## 2014-04-29 MED ORDER — AMOXICILLIN 500 MG PO CAPS: 500.0000 mg | ORAL_CAPSULE | Freq: Two times a day (BID) | ORAL | Status: DC

## 2014-04-29 NOTE — Assessment & Plan Note (Addendum)
Right breast invasive ductal carcinoma, palpable mass, 5 cm by mammogram and 5.7 cm by MRI grade 3, ER PR HER-2 negative, associated skin dimpling, T3, N0, M0 stage IIB Ki-67 90%  Current treatment: Neoadjuvant chemotherapy with dose dense Adriamycin and Cytoxan 04/10/2014. Plan is for 4 cycles followed by weekly Taxol x12; today is cycle 2 day 8 of dose dense AC  Decreased ejection fraction (underlying cardiomyopathy): echocardiogram revealed an EF of 40-45%. chemotherapy with Adriamycin in combination with Dexrazaxone (Zinecard) was given in order to protect the heart. Cardiology referral made.  Chemotherapy-related toxicities: 1. Chemotherapy induced neutropenia grade 3: Chemotherapy dosage reduced for cycle 2 2. Nausea grade 1 3. Loss of taste 4. Decreased appetite  Monitoring closely for chemotoxicities. Return to clinic in 1 week for cycle 3

## 2014-04-29 NOTE — Progress Notes (Signed)
Patient Care Team: Laurey Morale, MD as PCP - General Excell Seltzer, MD as Consulting Physician (General Surgery) Rulon Eisenmenger, MD as Consulting Physician (Hematology and Oncology) Rexene Edison, MD as Consulting Physician (Radiation Oncology) Trinda Pascal, NP as Nurse Practitioner (Nurse Practitioner)  DIAGNOSIS: Breast cancer of upper-outer quadrant of right female breast   Staging form: Breast, AJCC 7th Edition     Clinical stage from 04/01/2014: Stage IIB (T3, N0, M0) - Unsigned       Staging comments: Staged at breast conference on 12.2.15    SUMMARY OF ONCOLOGIC HISTORY:   Breast cancer of upper-outer quadrant of right female breast   03/18/2014 Mammogram Right breast suspicious mass 10:00 4.9 x 3 x 5 cm, 2 lower right axillary lymph nodes mildly thickened cortices   03/23/2014 Initial Biopsy Right breast needle biopsy 9:00: Invasive ductal carcinoma grade 3, ER PR HER-2 negative ratio 1.3   03/31/2014 Breast MRI Right breast mass 9:00 upper outer quadrant with contiguous skin involvement by 5.7 cm, right axillary lymph nodes normal in size   04/10/2014 -  Neo-Adjuvant Chemotherapy Dose dense Adriamycin and Cytoxan 4 followed by weekly Taxol 12    CHIEF COMPLIANT: cycle 2 day 6 AC  INTERVAL HISTORY: Stephanie Olsen is a 68 year old lady with above-mentioned history of right breast cancer currently on neoadjuvant chemotherapy. She received cycle 2 of dose dense a tumescent Cytoxan was here today for toxicity check. She reports she had excellent energy appetite the first 4-5 days of chemotherapy over the last couple of days she has felt like he more weaker and more tired. Denied any nausea or vomiting. She has a bad cold and a cough that is just not improving in spite of being observed for about a week. She denies any fevers or chills.she does have rib pain from all the coughing that she is doing.   REVIEW OF SYSTEMS:   Constitutional: Denies fevers, chills or  abnormal weight loss Eyes: Denies blurriness of vision Ears, nose, mouth, throat, and face: Denies mucositis or sore throat Respiratory: Denies cough, dyspnea or wheezes Cardiovascular: Denies palpitation, chest discomfort or lower extremity swelling Gastrointestinal:  Denies nausea, heartburn or change in bowel habits Skin: Denies abnormal skin rashes Lymphatics: Denies new lymphadenopathy or easy bruising Neurological:Denies numbness, tingling or new weaknesses Behavioral/Psych: Mood is stable, no new changes  Breast: patient reports of the breast lump is getting smaller All other systems were reviewed with the patient and are negative.  I have reviewed the past medical history, past surgical history, social history and family history with the patient and they are unchanged from previous note.  ALLERGIES:  has No Known Allergies.  MEDICATIONS:  Current Outpatient Prescriptions  Medication Sig Dispense Refill  . amoxicillin (AMOXIL) 500 MG capsule Take 1 capsule (500 mg total) by mouth 2 (two) times daily. 14 capsule 0  . Ascorbic Acid (VITAMIN C) 500 MG tablet Take 500 mg by mouth 2 (two) times daily.     Marland Kitchen aspirin 81 MG tablet Take 81 mg by mouth every morning.     . beta carotene w/minerals (OCUVITE) tablet Take 1 tablet by mouth every morning.     . Calcium Carb-Cholecalciferol (CALCIUM 600/VITAMIN D3) 600-800 MG-UNIT TABS Take 1 tablet by mouth every morning.    . cetirizine (ZYRTEC) 10 MG tablet Take 10 mg by mouth every morning.     . clonazePAM (KLONOPIN) 2 MG tablet Take 1 tablet (2 mg total) by mouth 2 (two)  times daily as needed for anxiety. (Patient taking differently: Take 1 mg by mouth 2 (two) times daily as needed for anxiety. ) 180 tablet 1  . cyclobenzaprine (FLEXERIL) 10 MG tablet Take 1 tablet (10 mg total) by mouth 3 (three) times daily as needed for muscle spasms. For spasms 90 tablet 5  . dexamethasone (DECADRON) 4 MG tablet Take 2 tablets by mouth once a day on the  day after chemotherapy and then take 2 tablets two times a day for 2 days. Take with food. 30 tablet 1  . fish oil-omega-3 fatty acids 1000 MG capsule Take 1 g by mouth every morning.     . fluticasone (FLONASE) 50 MCG/ACT nasal spray Place 2 sprays into the nose daily. (Patient taking differently: Place 1 spray into the nose 2 (two) times daily. ) 48 g 3  . HYDROcodone-acetaminophen (NORCO/VICODIN) 5-325 MG per tablet Take 1-2 tablets by mouth every 4 (four) hours as needed for moderate pain or severe pain. 25 tablet 0  . ibuprofen (ADVIL,MOTRIN) 200 MG tablet Take 200 mg by mouth every 6 (six) hours as needed for headache or mild pain.    Marland Kitchen ketorolac (ACULAR) 0.5 % ophthalmic solution   0  . latanoprost (XALATAN) 0.005 % ophthalmic solution Place 1 drop into both eyes at bedtime.    . lidocaine-prilocaine (EMLA) cream Apply to affected area once (Patient taking differently: Apply 1 application topically as needed (for port). ) 30 g 3  . lisinopril (PRINIVIL,ZESTRIL) 10 MG tablet Take 1 tablet (10 mg total) by mouth daily. (Patient taking differently: Take 10 mg by mouth at bedtime. ) 90 tablet 3  . LORazepam (ATIVAN) 0.5 MG tablet Take 1 tablet (0.5 mg total) by mouth every 6 (six) hours as needed (Nausea or vomiting). 30 tablet 0  . Magnesium 500 MG TABS Take 1 tablet by mouth at bedtime.    . metoCLOPramide (REGLAN) 10 MG tablet   5  . metroNIDAZOLE (METROGEL) 0.75 % gel Apply 1 application topically 2 (two) times daily as needed (rosacea on face.).     Marland Kitchen nystatin (MYCOSTATIN) 100000 UNIT/ML suspension Take 5 mLs (500,000 Units total) by mouth 4 (four) times daily. 60 mL 0  . ondansetron (ZOFRAN) 8 MG tablet Take 1 tablet (8 mg total) by mouth 2 (two) times daily as needed. Start on the third day after chemotherapy. 30 tablet 1  . prochlorperazine (COMPAZINE) 10 MG tablet Take 1 tablet (10 mg total) by mouth every 6 (six) hours as needed (Nausea or vomiting). 30 tablet 1  . timolol (BETIMOL) 0.5  % ophthalmic solution Place 1 drop into both eyes every morning.    Marland Kitchen UNABLE TO FIND Apply 1 each topically as needed. 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 1 each 1   No current facility-administered medications for this visit.    PHYSICAL EXAMINATION: ECOG PERFORMANCE STATUS: 1 - Symptomatic but completely ambulatory  Filed Vitals:   04/29/14 0817  BP: 116/53  Pulse: 92  Temp: 98.5 F (36.9 C)  Resp: 18   Filed Weights   04/29/14 0817  Weight: 195 lb 4.8 oz (88.587 kg)    GENERAL:alert, no distress and comfortable SKIN: skin color, texture, turgor are normal, no rashes or significant lesions EYES: normal, Conjunctiva are pink and non-injected, sclera clear OROPHARYNX:no exudate, no erythema and lips, buccal mucosa, and tongue normal  NECK: supple, thyroid normal size, non-tender, without nodularity LYMPH:  no palpable lymphadenopathy in the cervical, axillary  or inguinal LUNGS: clear to auscultation and percussion with normal breathing effort HEART: regular rate & rhythm and no murmurs and no lower extremity edema ABDOMEN:abdomen soft, non-tender and normal bowel sounds Musculoskeletal:no cyanosis of digits and no clubbing  NEURO: alert & oriented x 3 with fluent speech, no focal motor/sensory deficits  LABORATORY DATA:  I have reviewed the data as listed   Chemistry      Component Value Date/Time   NA 137 04/23/2014 0817   NA 134* 02/12/2014 1108   K 3.9 04/23/2014 0817   K 4.7 02/12/2014 1108   CL 100 02/12/2014 1108   CO2 26 04/23/2014 0817   CO2 25 02/12/2014 1108   BUN 7.0 04/23/2014 0817   BUN 10 02/12/2014 1108   CREATININE 0.8 04/23/2014 0817   CREATININE 0.8 02/12/2014 1108      Component Value Date/Time   CALCIUM 9.0 04/23/2014 0817   CALCIUM 9.5 02/12/2014 1108   ALKPHOS 89 04/23/2014 0817   ALKPHOS 71 02/12/2014 1108   AST 16 04/23/2014 0817   AST 22 02/12/2014 1108   ALT 19 04/23/2014 0817    ALT 22 02/12/2014 1108   BILITOT <0.20 04/23/2014 0817   BILITOT 0.5 02/12/2014 1108       Lab Results  Component Value Date   WBC 1.7* 04/29/2014   HGB 12.3 04/29/2014   HCT 38.2 04/29/2014   MCV 89.0 04/29/2014   PLT 305 04/29/2014   NEUTROABS 1.0* 04/29/2014    ASSESSMENT & PLAN:  Breast cancer of upper-outer quadrant of right female breast Right breast invasive ductal carcinoma, palpable mass, 5 cm by mammogram and 5.7 cm by MRI grade 3, ER PR HER-2 negative, associated skin dimpling, T3, N0, M0 stage IIB Ki-67 90%  Current treatment: Neoadjuvant chemotherapy with dose dense Adriamycin and Cytoxan 04/10/2014. Plan is for 4 cycles followed by weekly Taxol x12; today is cycle 2 day 6 of dose dense AC  Decreased ejection fraction (underlying cardiomyopathy): echocardiogram revealed an EF of 40-45%. chemotherapy with Adriamycin in combination with Dexrazaxone (Zinecard) was given in order to protect the heart. Cardiology referral made.  Chemotherapy-related toxicities: 1. Chemotherapy induced neutropenia grade 3: Chemotherapy dosage reduced for cycle 2 2. Nausea grade 1 3. Loss of taste 4. Decreased appetite   Acute bronchitis not resolving after 1 week of observation: I prescribed her amoxicillin 500 mg by mouth twice a day for a week because her ANC is only 1000. Instructed her to call us if she develops any fevers  Monitoring closely for chemotoxicities. Return to clinic in 1 week for cycle 3   Orders Placed This Encounter  Procedures  . CBC with Differential    Standing Status: Future     Number of Occurrences:      Standing Expiration Date: 04/29/2015  . Comprehensive metabolic panel (Cmet) - [facility]    Standing Status: Future     Number of Occurrences:      Standing Expiration Date: 04/29/2015   The patient has a good understanding of the overall plan. she agrees with it. She will call with any problems that may develop before her next visit here.   Rulon Eisenmenger, MD 04/29/2014 8:31 AM

## 2014-04-29 NOTE — Addendum Note (Signed)
Addended by: Prentiss Bells on: 04/29/2014 09:07 AM   Modules accepted: Medications

## 2014-04-29 NOTE — Telephone Encounter (Signed)
Per staff message and POF I have scheduled appts. Advised scheduler of appts. JMW  

## 2014-04-29 NOTE — Telephone Encounter (Signed)
per pof to sch pt appt-sent MW email to  sch trmt-pt aware of appt °

## 2014-05-01 DIAGNOSIS — C50919 Malignant neoplasm of unspecified site of unspecified female breast: Secondary | ICD-10-CM

## 2014-05-01 HISTORY — DX: Malignant neoplasm of unspecified site of unspecified female breast: C50.919

## 2014-05-04 ENCOUNTER — Other Ambulatory Visit: Payer: Self-pay | Admitting: *Deleted

## 2014-05-04 DIAGNOSIS — C50411 Malignant neoplasm of upper-outer quadrant of right female breast: Secondary | ICD-10-CM

## 2014-05-04 MED ORDER — LEVOFLOXACIN 750 MG PO TABS: 750.0000 mg | ORAL_TABLET | Freq: Every day | ORAL | Status: DC

## 2014-05-07 ENCOUNTER — Other Ambulatory Visit (HOSPITAL_COMMUNITY): Payer: Self-pay | Admitting: Internal Medicine

## 2014-05-07 ENCOUNTER — Ambulatory Visit (HOSPITAL_COMMUNITY)
Admission: RE | Admit: 2014-05-07 | Discharge: 2014-05-07 | Disposition: A | Discharge status: Home / Self Care | Payer: Medicare Other | Source: Ambulatory Visit | Attending: Internal Medicine | Admitting: Internal Medicine

## 2014-05-07 ENCOUNTER — Other Ambulatory Visit (HOSPITAL_COMMUNITY): Payer: Self-pay

## 2014-05-07 ENCOUNTER — Encounter (HOSPITAL_COMMUNITY): Payer: Self-pay

## 2014-05-07 ENCOUNTER — Other Ambulatory Visit: Payer: Self-pay

## 2014-05-07 VITALS — BP 144/80 | HR 84 | Wt 159.8 lb

## 2014-05-07 DIAGNOSIS — I420 Dilated cardiomyopathy: Secondary | ICD-10-CM | POA: Insufficient documentation

## 2014-05-07 DIAGNOSIS — I10 Essential (primary) hypertension: Secondary | ICD-10-CM

## 2014-05-07 DIAGNOSIS — I429 Cardiomyopathy, unspecified: Secondary | ICD-10-CM

## 2014-05-07 DIAGNOSIS — F411 Generalized anxiety disorder: Secondary | ICD-10-CM

## 2014-05-07 DIAGNOSIS — J3089 Other allergic rhinitis: Secondary | ICD-10-CM

## 2014-05-07 DIAGNOSIS — G4489 Other headache syndrome: Secondary | ICD-10-CM

## 2014-05-07 DIAGNOSIS — C50411 Malignant neoplasm of upper-outer quadrant of right female breast: Secondary | ICD-10-CM | POA: Diagnosis not present

## 2014-05-07 DIAGNOSIS — R0683 Snoring: Secondary | ICD-10-CM | POA: Insufficient documentation

## 2014-05-07 DIAGNOSIS — L719 Rosacea, unspecified: Secondary | ICD-10-CM

## 2014-05-07 DIAGNOSIS — M810 Age-related osteoporosis without current pathological fracture: Secondary | ICD-10-CM

## 2014-05-07 DIAGNOSIS — H409 Unspecified glaucoma: Secondary | ICD-10-CM

## 2014-05-07 DIAGNOSIS — Z8249 Family history of ischemic heart disease and other diseases of the circulatory system: Secondary | ICD-10-CM | POA: Diagnosis not present

## 2014-05-07 MED ORDER — CARVEDILOL 3.125 MG PO TABS
3.1250 mg | ORAL_TABLET | Freq: Two times a day (BID) | ORAL | Status: DC
Start: 2014-05-07 — End: 2015-05-18

## 2014-05-07 NOTE — Progress Notes (Signed)
Patient ID: Stephanie Olsen, female   DOB: 18-Feb-1946, 69 y.o.   MRN: [id number]  Referring Physician: Dr. Lindi Adie   HPI:  Stephanie Olsen is a 16 y/o with h/o borderline HTN but no known cardiac disease. Diagnosed with R breast cancer  03/2014.  Right breast invasive ductal carcinoma, palpable mass, 5 cm by mammogram and 5.7 cm by MRI grade 3, ER PR HER-2 negative, associated skin dimpling, T3, N0, M0 stage IIB Ki-67 90%.  Started neoadjuvant chemotherapy with dose dense Adriamycin and Cytoxan 04/10/2014. Has had 2 cycles so far. Plan is for 4 cycles followed by weekly Taxol x12. Followed lumpectomy and XRT. Echocardiogram 04/09/14 revealed an EF of 40-45%. Dexrazaxone (Zinecard) was given in order to protect the heart.   Denies any personal h/o known cardiac disease. On her mother's side - her mother and 4 of her aunts have had CAD. She denies CP or SOB. She has h/o tobacco use 1ppd x10 years quit 1977. Previously worked as a paralegal.Husband died from [place] in 01-01-14. Last fall was doing yoga and cardio workouts regularly without cardiac symptoms. Husband told her in past she snored on occasion. Was previously on lisinopril but stopped about 10 days ago due to low BP from chemo. Says she has been very tired since her husband died.    I reviewed echo personally:  Echo 04/09/14  EF 45% Grade I DD. RV ok. Lat s' 11.3 cm/sec GLS -17.8     Review of Systems: [y] = yes, '[ ]'  = no   General: Weight gain '[ ]' ; Weight loss '[ ]' ; Anorexia '[ ]' ; Fatigue '[ ]' ; Fever '[ ]' ; Chills '[ ]' ; Weakness '[ ]'   Cardiac: Chest pain/pressure '[ ]' ; Resting SOB '[ ]' ; Exertional SOB '[ ]' ; Orthopnea '[ ]' ; Pedal Edema '[ ]' ; Palpitations '[ ]' ; Syncope '[ ]' ; Presyncope '[ ]' ; Paroxysmal nocturnal dyspnea'[ ]'   Pulmonary: Cough '[ ]' ; Wheezing'[ ]' ; Hemoptysis'[ ]' ; Sputum '[ ]' ; Snoring '[ ]'   GI: Vomiting'[ ]' ; Dysphagia'[ ]' ; Melena'[ ]' ; Hematochezia '[ ]' ; Heartburn'[ ]' ; Abdominal pain '[ ]' ; Constipation '[ ]' ; Diarrhea '[ ]' ; BRBPR '[ ]'   GU: Hematuria[  ]; Dysuria '[ ]' ; Nocturia'[ ]'   Vascular: Pain in legs with walking '[ ]' ; Pain in feet with lying flat '[ ]' ; Non-healing sores '[ ]' ; Stroke '[ ]' ; TIA '[ ]' ; Slurred speech '[ ]' ;  Neuro: Headaches'[ ]' ; Vertigo'[ ]' ; Seizures'[ ]' ; Paresthesias'[ ]' ;Blurred vision '[ ]' ; Diplopia '[ ]' ; Vision changes '[ ]'   Ortho/Skin: Arthritis '[ ]' ; Joint pain '[ ]' ; Muscle pain '[ ]' ; Joint swelling '[ ]' ; Back Pain '[ ]' ; Rash '[ ]'   Psych: Depression'[ ]' ; Anxiety'[ ]'   Heme: Bleeding problems '[ ]' ; Clotting disorders '[ ]' ; Anemia '[ ]'   Endocrine: Diabetes '[ ]' ; Thyroid dysfunction'[ ]'    Past Medical History  Diagnosis Date  . Allergy   . Hypertension   . Osteoporosis     pt states has ostopenia not osteoporosis   . Anxiety   . Headache(784.0)   . Chicken pox   . Rosacea   . Glaucoma     sees Dr. Sarita Haver   . Breast cancer of upper-outer quadrant of right female breast 03/25/2014  . Osteopenia     Current Outpatient Prescriptions  Medication Sig Dispense Refill  . amoxicillin (AMOXIL) 500 MG capsule Take 1 capsule (500 mg total) by mouth 2 (two) times daily. 14 capsule 0  . Ascorbic Acid (VITAMIN C) 500 MG tablet Take 500 mg by mouth 2 (two)  times daily.     Marland Kitchen aspirin 81 MG tablet Take 81 mg by mouth every morning.     . beta carotene w/minerals (OCUVITE) tablet Take 1 tablet by mouth every morning.     . Calcium Carb-Cholecalciferol (CALCIUM 600/VITAMIN D3) 600-800 MG-UNIT TABS Take 1 tablet by mouth every morning.    . cetirizine (ZYRTEC) 10 MG tablet Take 10 mg by mouth every morning.     . clonazePAM (KLONOPIN) 2 MG tablet Take 1 tablet (2 mg total) by mouth 2 (two) times daily as needed for anxiety. (Patient taking differently: Take 1 mg by mouth 2 (two) times daily as needed for anxiety. ) 180 tablet 1  . cyclobenzaprine (FLEXERIL) 10 MG tablet Take 1 tablet (10 mg total) by mouth 3 (three) times daily as needed for muscle spasms. For spasms 90 tablet 5  . dexamethasone (DECADRON) 4 MG tablet Take 2 tablets by mouth once a  day on the day after chemotherapy and then take 2 tablets two times a day for 2 days. Take with food. 30 tablet 1  . fish oil-omega-3 fatty acids 1000 MG capsule Take 1 g by mouth every morning.     . fluticasone (FLONASE) 50 MCG/ACT nasal spray Place 2 sprays into the nose daily. (Patient taking differently: Place 1 spray into the nose 2 (two) times daily. ) 48 g 3  . HYDROcodone-acetaminophen (NORCO/VICODIN) 5-325 MG per tablet Take 1-2 tablets by mouth every 4 (four) hours as needed for moderate pain or severe pain. 25 tablet 0  . ibuprofen (ADVIL,MOTRIN) 200 MG tablet Take 200 mg by mouth every 6 (six) hours as needed for headache or mild pain.    Marland Kitchen ketorolac (ACULAR) 0.5 % ophthalmic solution   0  . latanoprost (XALATAN) 0.005 % ophthalmic solution Place 1 drop into both eyes at bedtime.    Marland Kitchen levofloxacin (LEVAQUIN) 750 MG tablet Take 1 tablet (750 mg total) by mouth daily. 7 tablet 0  . lidocaine-prilocaine (EMLA) cream Apply to affected area once (Patient taking differently: Apply 1 application topically as needed (for port). ) 30 g 3  . lisinopril (PRINIVIL,ZESTRIL) 10 MG tablet Take 1 tablet (10 mg total) by mouth daily. (Patient taking differently: Take 10 mg by mouth at bedtime. ) 90 tablet 3  . LORazepam (ATIVAN) 0.5 MG tablet Take 1 tablet (0.5 mg total) by mouth every 6 (six) hours as needed (Nausea or vomiting). 30 tablet 0  . Magnesium 500 MG TABS Take 1 tablet by mouth at bedtime.    . metoCLOPramide (REGLAN) 10 MG tablet   5  . metroNIDAZOLE (METROGEL) 0.75 % gel Apply 1 application topically 2 (two) times daily as needed (rosacea on face.).     Marland Kitchen nystatin (MYCOSTATIN) 100000 UNIT/ML suspension Take 5 mLs (500,000 Units total) by mouth 4 (four) times daily. 60 mL 0  . ondansetron (ZOFRAN) 8 MG tablet Take 1 tablet (8 mg total) by mouth 2 (two) times daily as needed. Start on the third day after chemotherapy. 30 tablet 1  . prochlorperazine (COMPAZINE) 10 MG tablet Take 1 tablet (10  mg total) by mouth every 6 (six) hours as needed (Nausea or vomiting). 30 tablet 1  . timolol (BETIMOL) 0.5 % ophthalmic solution Place 1 drop into both eyes every morning.    Marland Kitchen UNABLE TO FIND Apply 1 each topically as needed. 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 1 each 1   No current facility-administered medications for this encounter.  No Known Allergies    History   Social History  . Marital Status: Widowed    Spouse Name: N/A    Number of Children: N/A  . Years of Education: N/A   Occupational History  . Not on file.   Social History Main Topics  . Smoking status: Former Smoker -- 1.00 packs/day for 10 years    Quit date: 05/02/1975  . Smokeless tobacco: Never Used  . Alcohol Use: 1.0 oz/week    2 Not specified per week     Comment: weekends-wine  . Drug Use: No  . Sexual Activity: Not on file   Other Topics Concern  . Not on file   Social History Narrative      Family History  Problem Relation Age of Onset  . Arthritis    . Coronary artery disease    . Hypertension    . Stroke    . Macular degeneration    . Lung cancer Maternal Uncle     heavy smoker  . Colon cancer Paternal Grandmother 43  . Lung cancer Cousin     smoker  . Leukemia Cousin 3    Filed Vitals:   05/07/14 1200  BP: 144/80  Pulse: 84  Weight: 159 lb 12.8 oz (72.485 kg)  SpO2: 96%    PHYSICAL EXAM: General:  Well appearing. No respiratory difficulty HEENT: normal Neck: supple. no JVD. Carotids 2+ bilat; no bruits. No lymphadenopathy or thryomegaly appreciated. Cor: PMI nondisplaced. Regular rate & rhythm. No rubs, gallops or murmurs. Lungs: clear Abdomen: soft, nontender, nondistended. No hepatosplenomegaly. No bruits or masses. Good bowel sounds. Extremities: no cyanosis, clubbing, rash, edema Neuro: alert & oriented x 3, cranial nerves grossly intact. moves all 4 extremities w/o difficulty. Affect pleasant.  ECG: NSR. No  ST-T wave abnormalities. Poor R wave progression.   ASSESSMENT & PLAN: 1. R breast CA - triple negative receptor status 2. Cardimyopathy - EF 45% by echo - occurred prior to chemotherapy  3. Borderline HTN 4. Family h/o CAD   I have reviewed her echo personally. She has mild to moderate LV dysfunction which is asymptomatic and preceded her adriamycin therapy. She does have a family h/o of heart disease but has no symptoms to suggest underlying ischemic heart disease. She has had borderline HTN but not severe enough to cause LV dysfunction. She reports a h/o of mild snoring and fatigue but denies h/o OSA.  Thus I have no clear explanation for why her heart function should be reduced. In looking at her echo, aside from her EF, other parameters are quite normal.   At this point, I think the best plan would be to hold the adriamycin if possible. Will resume her lisinopril and add carvedilol 3.125 bid. I would like to proceed with stress cMRI to more clearly evaluate her cardiac parameters and assess for underlying CAD. We also discussed possible statin therapy as protective mechanism but she would like to avoid as here husband had severe negative reaction to statins. I think it is reasonable to recheck thyroid parameters and place overnight oximeter to exclude OSA.   Chee Dimon,MD 12:43 PM  Addendum: I spoke to Dr. Lindi Adie. Given aggressiveness of tumor would like to continue adriamycin if at all possible. Will arrange cMRI in near future. If EF stable or improved with continue adriamycin. If EF < 40% would stop.   Ziana Heyliger,MD 4:40 PM

## 2014-05-07 NOTE — Patient Instructions (Addendum)
CONTINUE Lisinopril 10mg  once daily.  START Carvedilol (Coreg) 3.125mg  tablet twice daily.  Will schedule cardiac MRI and contact you with appointment day and time.  Will add on lab work to your appointment tomorrow to assess thyroid.  Will set up overnight pulse oximetry to assess for sleep apnea ([facility] will contact you for a convenient day and time to set this up).  Follow up 4 weeks (We will call you to set this up closer to time).

## 2014-05-08 ENCOUNTER — Ambulatory Visit (HOSPITAL_BASED_OUTPATIENT_CLINIC_OR_DEPARTMENT_OTHER): Payer: Medicare Other

## 2014-05-08 ENCOUNTER — Telehealth: Payer: Self-pay | Admitting: *Deleted

## 2014-05-08 ENCOUNTER — Ambulatory Visit (HOSPITAL_BASED_OUTPATIENT_CLINIC_OR_DEPARTMENT_OTHER): Payer: Medicare Other | Admitting: Lab

## 2014-05-08 ENCOUNTER — Ambulatory Visit (HOSPITAL_BASED_OUTPATIENT_CLINIC_OR_DEPARTMENT_OTHER): Payer: Medicare Other | Admitting: Hematology and Oncology

## 2014-05-08 ENCOUNTER — Telehealth: Payer: Self-pay | Admitting: Hematology and Oncology

## 2014-05-08 VITALS — BP 122/69 | HR 98 | Temp 98.8°F | Resp 18 | Ht 66.0 in | Wt 191.7 lb

## 2014-05-08 DIAGNOSIS — J3089 Other allergic rhinitis: Secondary | ICD-10-CM

## 2014-05-08 DIAGNOSIS — C50411 Malignant neoplasm of upper-outer quadrant of right female breast: Secondary | ICD-10-CM

## 2014-05-08 DIAGNOSIS — C50419 Malignant neoplasm of upper-outer quadrant of unspecified female breast: Secondary | ICD-10-CM | POA: Diagnosis not present

## 2014-05-08 DIAGNOSIS — C50811 Malignant neoplasm of overlapping sites of right female breast: Secondary | ICD-10-CM

## 2014-05-08 DIAGNOSIS — I420 Dilated cardiomyopathy: Secondary | ICD-10-CM

## 2014-05-08 DIAGNOSIS — F411 Generalized anxiety disorder: Secondary | ICD-10-CM

## 2014-05-08 DIAGNOSIS — I10 Essential (primary) hypertension: Secondary | ICD-10-CM

## 2014-05-08 DIAGNOSIS — F419 Anxiety disorder, unspecified: Secondary | ICD-10-CM | POA: Diagnosis not present

## 2014-05-08 DIAGNOSIS — E038 Other specified hypothyroidism: Secondary | ICD-10-CM

## 2014-05-08 DIAGNOSIS — M810 Age-related osteoporosis without current pathological fracture: Secondary | ICD-10-CM

## 2014-05-08 DIAGNOSIS — R0683 Snoring: Secondary | ICD-10-CM

## 2014-05-08 DIAGNOSIS — Z5111 Encounter for antineoplastic chemotherapy: Secondary | ICD-10-CM

## 2014-05-08 DIAGNOSIS — L719 Rosacea, unspecified: Secondary | ICD-10-CM

## 2014-05-08 DIAGNOSIS — H409 Unspecified glaucoma: Secondary | ICD-10-CM

## 2014-05-08 DIAGNOSIS — G4489 Other headache syndrome: Secondary | ICD-10-CM | POA: Diagnosis not present

## 2014-05-08 LAB — COMPREHENSIVE METABOLIC PANEL (CC13)
ALT: 18 U/L (ref 0–55)
AST: 15 U/L (ref 5–34)
Albumin: 3.1 g/dL — ABNORMAL LOW (ref 3.5–5.0)
Alkaline Phosphatase: 119 U/L (ref 40–150)
Anion Gap: 10 mEq/L (ref 3–11)
BUN: 11.1 mg/dL (ref 7.0–26.0)
CHLORIDE: 104 meq/L (ref 98–109)
CO2: 23 mEq/L (ref 22–29)
Calcium: 9.6 mg/dL (ref 8.4–10.4)
Creatinine: 0.7 mg/dL (ref 0.6–1.1)
EGFR: 84 mL/min/{1.73_m2} — ABNORMAL LOW (ref >90)
GLUCOSE: 99 mg/dL (ref 70–140)
Potassium: 3.9 mEq/L (ref 3.5–5.1)
Sodium: 137 mEq/L (ref 136–145)
TOTAL PROTEIN: 6.7 g/dL (ref 6.4–8.3)
Total Bilirubin: 0.3 mg/dL (ref 0.20–1.20)

## 2014-05-08 LAB — CBC WITH DIFFERENTIAL/PLATELET
BASO%: 0.8 % (ref 0.0–2.0)
BASOS ABS: 0.1 10*3/uL (ref 0.0–0.1)
EOS ABS: 0 10*3/uL (ref 0.0–0.5)
EOS%: 0.2 % (ref 0.0–7.0)
HEMATOCRIT: 35.6 % (ref 34.8–46.6)
HEMOGLOBIN: 11.6 g/dL (ref 11.6–15.9)
LYMPH#: 1.4 10*3/uL (ref 0.9–3.3)
LYMPH%: 12.6 % — ABNORMAL LOW (ref 14.0–49.7)
MCH: 28.6 pg (ref 25.1–34.0)
MCHC: 32.6 g/dL (ref 31.5–36.0)
MCV: 87.5 fL (ref 79.5–101.0)
MONO#: 1.2 10*3/uL — ABNORMAL HIGH (ref 0.1–0.9)
MONO%: 10.8 % (ref 0.0–14.0)
NEUT#: 8.6 10*3/uL — ABNORMAL HIGH (ref 1.5–6.5)
NEUT%: 75.6 % (ref 38.4–76.8)
Platelets: 354 10*3/uL (ref 145–400)
RBC: 4.06 10*6/uL (ref 3.70–5.45)
RDW: 14.1 % (ref 11.2–14.5)
WBC: 11.4 10*3/uL — AB (ref 3.9–10.3)

## 2014-05-08 LAB — TSH CHCC: TSH: 1.549 m[IU]/L (ref 0.308–3.960)

## 2014-05-08 LAB — T4: T4, Total: 9.1 ug/dL (ref 4.5–12.0)

## 2014-05-08 MED ORDER — HEPARIN SOD (PORK) LOCK FLUSH 100 UNIT/ML IV SOLN
500.0000 [IU] | Freq: Once | INTRAVENOUS | Status: AC | PRN
  Administered 2014-05-08: 500 [IU]
  Filled 2014-05-08: qty 5

## 2014-05-08 MED ORDER — LACTATED RINGERS IV SOLN
499.0000 mg/m2 | Freq: Once | INTRAVENOUS | Status: AC
  Administered 2014-05-08: 1000 mg via INTRAVENOUS
  Filled 2014-05-08: qty 100

## 2014-05-08 MED ORDER — PALONOSETRON HCL INJECTION 0.25 MG/5ML
0.2500 mg | Freq: Once | INTRAVENOUS | Status: AC
  Administered 2014-05-08: 0.25 mg via INTRAVENOUS

## 2014-05-08 MED ORDER — SODIUM CHLORIDE 0.9 % IV SOLN
Freq: Once | INTRAVENOUS | Status: AC
  Administered 2014-05-08: 09:00:00 via INTRAVENOUS

## 2014-05-08 MED ORDER — PALONOSETRON HCL INJECTION 0.25 MG/5ML
INTRAVENOUS | Status: AC
  Filled 2014-05-08: qty 5

## 2014-05-08 MED ORDER — SODIUM CHLORIDE 0.9 % IV SOLN
150.0000 mg | Freq: Once | INTRAVENOUS | Status: AC
  Administered 2014-05-08: 150 mg via INTRAVENOUS
  Filled 2014-05-08: qty 5

## 2014-05-08 MED ORDER — DOXORUBICIN HCL CHEMO IV INJECTION 2 MG/ML
50.0000 mg/m2 | Freq: Once | INTRAVENOUS | Status: AC
  Administered 2014-05-08: 102 mg via INTRAVENOUS
  Filled 2014-05-08: qty 51

## 2014-05-08 MED ORDER — DEXAMETHASONE SODIUM PHOSPHATE 20 MG/5ML IJ SOLN
12.0000 mg | Freq: Once | INTRAMUSCULAR | Status: AC
  Administered 2014-05-08: 12 mg via INTRAVENOUS

## 2014-05-08 MED ORDER — SODIUM CHLORIDE 0.9 % IV SOLN
500.0000 mg/m2 | Freq: Once | INTRAVENOUS | Status: AC
  Administered 2014-05-08: 1020 mg via INTRAVENOUS
  Filled 2014-05-08: qty 51

## 2014-05-08 MED ORDER — SODIUM CHLORIDE 0.9 % IJ SOLN
10.0000 mL | INTRAMUSCULAR | Status: DC | PRN
Start: 2014-05-08 — End: 2014-05-08
  Administered 2014-05-08: 10 mL
  Filled 2014-05-08: qty 10

## 2014-05-08 MED ORDER — DEXAMETHASONE SODIUM PHOSPHATE 20 MG/5ML IJ SOLN
INTRAMUSCULAR | Status: AC
  Filled 2014-05-08: qty 5

## 2014-05-08 MED ORDER — DEXRAZOXANE INJECTION 500 MG
499.0000 mg/m2 | Freq: Once | INTRAVENOUS | Status: DC
  Filled 2014-05-08: qty 102

## 2014-05-08 NOTE — Progress Notes (Signed)
Patient Care Team: Laurey Morale, MD as PCP - General Excell Seltzer, MD as Consulting Physician (General Surgery) Rulon Eisenmenger, MD as Consulting Physician (Hematology and Oncology) Rexene Edison, MD as Consulting Physician (Radiation Oncology) Trinda Pascal, NP as Nurse Practitioner (Nurse Practitioner)  DIAGNOSIS: Breast cancer of upper-outer quadrant of right female breast   Staging form: Breast, AJCC 7th Edition     Clinical stage from 04/01/2014: Stage IIB (T3, N0, M0) - Unsigned       Staging comments: Staged at breast conference on 12.2.15    SUMMARY OF ONCOLOGIC HISTORY:   Breast cancer of upper-outer quadrant of right female breast   03/18/2014 Mammogram Right breast suspicious mass 10:00 4.9 x 3 x 5 cm, 2 lower right axillary lymph nodes mildly thickened cortices   03/23/2014 Initial Biopsy Right breast needle biopsy 9:00: Invasive ductal carcinoma grade 3, ER PR HER-2 negative ratio 1.3   03/31/2014 Breast MRI Right breast mass 9:00 upper outer quadrant with contiguous skin involvement by 5.7 cm, right axillary lymph nodes normal in size   04/10/2014 -  Neo-Adjuvant Chemotherapy Dose dense Adriamycin and Cytoxan 4 followed by weekly Taxol 12    CHIEF COMPLIANT: Cycle 3 of dose dense Adriamycin and Cytoxan  INTERVAL HISTORY: Stephanie Olsen is a 69-year-old lady with above-mentioned history of right-sided breast cancer is currently neoadjuvant chemotherapy with dose dense Adriamycin and Cytoxan. Today cycle 3 of treatment. She had done extremely well from chemotherapy standpoint. She had bronchitis for which we treated her initially with amoxicillin without any result and then later switched to Levaquin with good response to treatment. She denies any fevers or chills and she is slowly improving currently. She had seen audiology for her cardiomyopathy and they are obtaining a cardiac MRI. Patient is asymptomatic and we've elected to continue with the same  treatment.  REVIEW OF SYSTEMS:   Constitutional: Denies fevers, chills or abnormal weight loss Eyes: Denies blurriness of vision Ears, nose, mouth, throat, and face: Denies mucositis or sore throat Respiratory: Denies cough, dyspnea or wheezes Cardiovascular: Denies palpitation, chest discomfort or lower extremity swelling Gastrointestinal:  Denies nausea, heartburn or change in bowel habits Skin: Denies abnormal skin rashes Lymphatics: Denies new lymphadenopathy or easy bruising Neurological:Denies numbness, tingling or new weaknesses Behavioral/Psych: Mood is stable, no new changes  Breast: Right breast lump is smaller All other systems were reviewed with the patient and are negative.  I have reviewed the past medical history, past surgical history, social history and family history with the patient and they are unchanged from previous note.  ALLERGIES:  has No Known Allergies.  MEDICATIONS:  Current Outpatient Prescriptions  Medication Sig Dispense Refill  . amoxicillin (AMOXIL) 500 MG capsule Take 1 capsule (500 mg total) by mouth 2 (two) times daily. 14 capsule 0  . Ascorbic Acid (VITAMIN C) 500 MG tablet Take 500 mg by mouth 2 (two) times daily.     Marland Kitchen aspirin 81 MG tablet Take 81 mg by mouth every morning.     . beta carotene w/minerals (OCUVITE) tablet Take 1 tablet by mouth every morning.     . Calcium Carb-Cholecalciferol (CALCIUM 600/VITAMIN D3) 600-800 MG-UNIT TABS Take 1 tablet by mouth every morning.    . carvedilol (COREG) 3.125 MG tablet Take 1 tablet (3.125 mg total) by mouth 2 (two) times daily. 180 tablet 3  . cetirizine (ZYRTEC) 10 MG tablet Take 10 mg by mouth every morning.     . clonazePAM (KLONOPIN) 2  MG tablet Take 1 tablet (2 mg total) by mouth 2 (two) times daily as needed for anxiety. (Patient taking differently: Take 1 mg by mouth 2 (two) times daily as needed for anxiety. ) 180 tablet 1  . cyclobenzaprine (FLEXERIL) 10 MG tablet Take 1 tablet (10 mg total)  by mouth 3 (three) times daily as needed for muscle spasms. For spasms 90 tablet 5  . dexamethasone (DECADRON) 4 MG tablet Take 2 tablets by mouth once a day on the day after chemotherapy and then take 2 tablets two times a day for 2 days. Take with food. 30 tablet 1  . fish oil-omega-3 fatty acids 1000 MG capsule Take 1 g by mouth every morning.     . fluticasone (FLONASE) 50 MCG/ACT nasal spray Place 2 sprays into the nose daily. (Patient taking differently: Place 1 spray into the nose 2 (two) times daily. ) 48 g 3  . HYDROcodone-acetaminophen (NORCO/VICODIN) 5-325 MG per tablet Take 1-2 tablets by mouth every 4 (four) hours as needed for moderate pain or severe pain. 25 tablet 0  . ibuprofen (ADVIL,MOTRIN) 200 MG tablet Take 200 mg by mouth every 6 (six) hours as needed for headache or mild pain.    Marland Kitchen ketorolac (ACULAR) 0.5 % ophthalmic solution   0  . latanoprost (XALATAN) 0.005 % ophthalmic solution Place 1 drop into both eyes at bedtime.    Marland Kitchen levofloxacin (LEVAQUIN) 750 MG tablet Take 1 tablet (750 mg total) by mouth daily. 7 tablet 0  . lidocaine-prilocaine (EMLA) cream Apply to affected area once (Patient taking differently: Apply 1 application topically as needed (for port). ) 30 g 3  . lisinopril (PRINIVIL,ZESTRIL) 10 MG tablet Take 1 tablet (10 mg total) by mouth daily. (Patient taking differently: Take 10 mg by mouth at bedtime. ) 90 tablet 3  . LORazepam (ATIVAN) 0.5 MG tablet Take 1 tablet (0.5 mg total) by mouth every 6 (six) hours as needed (Nausea or vomiting). 30 tablet 0  . Magnesium 500 MG TABS Take 1 tablet by mouth at bedtime.    . metoCLOPramide (REGLAN) 10 MG tablet   5  . metroNIDAZOLE (METROGEL) 0.75 % gel Apply 1 application topically 2 (two) times daily as needed (rosacea on face.).     Marland Kitchen nystatin (MYCOSTATIN) 100000 UNIT/ML suspension Take 5 mLs (500,000 Units total) by mouth 4 (four) times daily. 60 mL 0  . ondansetron (ZOFRAN) 8 MG tablet Take 1 tablet (8 mg total) by  mouth 2 (two) times daily as needed. Start on the third day after chemotherapy. 30 tablet 1  . prochlorperazine (COMPAZINE) 10 MG tablet Take 1 tablet (10 mg total) by mouth every 6 (six) hours as needed (Nausea or vomiting). 30 tablet 1  . timolol (BETIMOL) 0.5 % ophthalmic solution Place 1 drop into both eyes every morning.    Marland Kitchen UNABLE TO FIND Apply 1 each topically as needed. 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 1 each 1   No current facility-administered medications for this visit.    PHYSICAL EXAMINATION: ECOG PERFORMANCE STATUS: 1 - Symptomatic but completely ambulatory  Filed Vitals:   05/08/14 0832  BP:   Pulse: 98  Temp:   Resp:    Filed Weights   05/08/14 0830  Weight: 191 lb 11.2 oz (86.955 kg)    GENERAL:alert, no distress and comfortable SKIN: skin color, texture, turgor are normal, no rashes or significant lesions EYES: normal, Conjunctiva are pink and non-injected, sclera clear OROPHARYNX:no  exudate, no erythema and lips, buccal mucosa, and tongue normal  NECK: supple, thyroid normal size, non-tender, without nodularity LYMPH:  no palpable lymphadenopathy in the cervical, axillary or inguinal LUNGS: clear to auscultation and percussion with normal breathing effort HEART: regular rate & rhythm and no murmurs and no lower extremity edema ABDOMEN:abdomen soft, non-tender and normal bowel sounds Musculoskeletal:no cyanosis of digits and no clubbing  NEURO: alert & oriented x 3 with fluent speech, no focal motor/sensory deficits  LABORATORY DATA:  I have reviewed the data as listed   Chemistry      Component Value Date/Time   NA 136 04/29/2014 0803   NA 134* 02/12/2014 1108   K 4.1 04/29/2014 0803   K 4.7 02/12/2014 1108   CL 100 02/12/2014 1108   CO2 28 04/29/2014 0803   CO2 25 02/12/2014 1108   BUN 12.6 04/29/2014 0803   BUN 10 02/12/2014 1108   CREATININE 0.8 04/29/2014 0803   CREATININE 0.8 02/12/2014  1108      Component Value Date/Time   CALCIUM 9.2 04/29/2014 0803   CALCIUM 9.5 02/12/2014 1108   ALKPHOS 91 04/29/2014 0803   ALKPHOS 71 02/12/2014 1108   AST 21 04/29/2014 0803   AST 22 02/12/2014 1108   ALT 31 04/29/2014 0803   ALT 22 02/12/2014 1108   BILITOT 0.52 04/29/2014 0803   BILITOT 0.5 02/12/2014 1108       Lab Results  Component Value Date   WBC 11.4* 05/08/2014   HGB 11.6 05/08/2014   HCT 35.6 05/08/2014   MCV 87.5 05/08/2014   PLT 354 05/08/2014   NEUTROABS 8.6* 05/08/2014    ASSESSMENT & PLAN:  Breast cancer of upper-outer quadrant of right female breast Right breast invasive ductal carcinoma, palpable mass, 5 cm by mammogram and 5.7 cm by MRI grade 3, ER PR HER-2 negative, associated skin dimpling, T3, N0, M0 stage IIB Ki-67 90%  Current treatment: Neoadjuvant chemotherapy with dose dense Adriamycin and Cytoxan 04/10/2014. Plan is for 4 cycles followed by weekly Taxol x12; today is cycle 3 day 1 of dose dense AC  Decreased ejection fraction (underlying cardiomyopathy): echocardiogram revealed an EF of 40-45%. chemotherapy with Adriamycin in combination with Dexrazaxone (Zinecard) was given in order to protect the heart. Dr. Haroldine Laws is recommending cardiac MRI for further evaluation. We discussed different options including stopping therapy but we decided to continue the current treatment and await the cardiology workup. Her heart related medications have been adjusted to assist heart function. Patient does not have any symptoms of heart failure.  Chemotherapy-related toxicities: 1. Chemotherapy induced neutropenia grade 3: Chemotherapy dosage reduced for cycle 2 2. Nausea grade 1 3. Loss of taste 4. Decreased appetite  5. Bronchitis after cycle 2 treated with amoxicillin Monitoring closely for toxicities. Reviewed her blood work and they appear to be adequate for treatment today.   Orders Placed This Encounter  Procedures  . CBC with Differential     Standing Status: Future     Number of Occurrences:      Standing Expiration Date: 05/08/2015  . Comprehensive metabolic panel (Cmet) - [facility]    Standing Status: Future     Number of Occurrences:      Standing Expiration Date: 05/08/2015   The patient has a good understanding of the overall plan. she agrees with it. She will call with any problems that may develop before her next visit here.   Rulon Eisenmenger, MD 05/08/2014 8:53 AM

## 2014-05-08 NOTE — Assessment & Plan Note (Signed)
Right breast invasive ductal carcinoma, palpable mass, 5 cm by mammogram and 5.7 cm by MRI grade 3, ER PR HER-2 negative, associated skin dimpling, T3, N0, M0 stage IIB Ki-67 90%  Current treatment: Neoadjuvant chemotherapy with dose dense Adriamycin and Cytoxan 04/10/2014. Plan is for 4 cycles followed by weekly Taxol x12; today is cycle 3 day 1 of dose dense AC  Decreased ejection fraction (underlying cardiomyopathy): echocardiogram revealed an EF of 40-45%. chemotherapy with Adriamycin in combination with Dexrazaxone (Zinecard) was given in order to protect the heart. Dr. Haroldine Laws is recommending cardiac MRI for further evaluation. We discussed different options including stopping therapy but we decided to continue the current treatment and await the cardiology workup. Her heart related medications have been adjusted to assist heart function. Patient does not have any symptoms of heart failure.  Chemotherapy-related toxicities: 1. Chemotherapy induced neutropenia grade 3: Chemotherapy dosage reduced for cycle 2 2. Nausea grade 1 3. Loss of taste 4. Decreased appetite  5. Bronchitis after cycle 2 treated with amoxicillin Monitoring closely for toxicities. Reviewed her blood work and they appear to be adequate for treatment today.

## 2014-05-08 NOTE — Telephone Encounter (Signed)
I have adjusted 1/22 appt

## 2014-05-08 NOTE — Addendum Note (Signed)
Encounter addended by: Georga Kaufmann, CCT on: 05/08/2014  1:28 PM<BR>     Documentation filed: Charges VN

## 2014-05-08 NOTE — Telephone Encounter (Signed)
, °

## 2014-05-08 NOTE — Patient Instructions (Signed)
[facility] Discharge Instructions for Patients Receiving Chemotherapy  Today you received the following chemotherapy agents Adriamycin, cytoxan.  To help prevent nausea and vomiting after your treatment, we encourage you to take your nausea medication Compazine, zofran, ativan as ordered by Dr. Lindi Adie.   If you develop nausea and vomiting that is not controlled by your nausea medication, call the clinic.   BELOW ARE SYMPTOMS THAT SHOULD BE REPORTED IMMEDIATELY:  *FEVER GREATER THAN 100.5 F  *CHILLS WITH OR WITHOUT FEVER  NAUSEA AND VOMITING THAT IS NOT CONTROLLED WITH YOUR NAUSEA MEDICATION  *UNUSUAL SHORTNESS OF BREATH  *UNUSUAL BRUISING OR BLEEDING  TENDERNESS IN MOUTH AND THROAT WITH OR WITHOUT PRESENCE OF ULCERS  *URINARY PROBLEMS  *BOWEL PROBLEMS  UNUSUAL RASH Items with * indicate a potential emergency and should be followed up as soon as possible.  Feel free to call the clinic you have any questions or concerns. The clinic phone number is [phone].

## 2014-05-09 ENCOUNTER — Ambulatory Visit (HOSPITAL_BASED_OUTPATIENT_CLINIC_OR_DEPARTMENT_OTHER): Payer: Medicare Other

## 2014-05-09 DIAGNOSIS — C50411 Malignant neoplasm of upper-outer quadrant of right female breast: Secondary | ICD-10-CM

## 2014-05-09 DIAGNOSIS — C50811 Malignant neoplasm of overlapping sites of right female breast: Secondary | ICD-10-CM | POA: Diagnosis not present

## 2014-05-09 DIAGNOSIS — Z5189 Encounter for other specified aftercare: Secondary | ICD-10-CM

## 2014-05-09 MED ORDER — PEGFILGRASTIM INJECTION 6 MG/0.6ML ~~LOC~~
6.0000 mg | PREFILLED_SYRINGE | Freq: Once | SUBCUTANEOUS | Status: AC
  Administered 2014-05-09: 6 mg via SUBCUTANEOUS

## 2014-05-09 NOTE — Patient Instructions (Signed)
Pegfilgrastim injection What is this medicine? PEGFILGRASTIM (peg fil GRA stim) is a long-acting granulocyte colony-stimulating factor that stimulates the growth of neutrophils, a type of white blood cell important in the body's fight against infection. It is used to reduce the incidence of fever and infection in patients with certain types of cancer who are receiving chemotherapy that affects the bone marrow. This medicine may be used for other purposes; ask your health care provider or pharmacist if you have questions. COMMON BRAND NAME(S): Neulasta What should I tell my health care provider before I take this medicine? They need to know if you have any of these conditions: -latex allergy -ongoing radiation therapy -sickle cell disease -skin reactions to acrylic adhesives (On-Body Injector only) -an unusual or allergic reaction to pegfilgrastim, filgrastim, other medicines, foods, dyes, or preservatives -pregnant or trying to get pregnant -breast-feeding How should I use this medicine? This medicine is for injection under the skin. If you get this medicine at home, you will be taught how to prepare and give the pre-filled syringe or how to use the On-body Injector. Refer to the patient Instructions for Use for detailed instructions. Use exactly as directed. Take your medicine at regular intervals. Do not take your medicine more often than directed. It is important that you put your used needles and syringes in a special sharps container. Do not put them in a trash can. If you do not have a sharps container, call your pharmacist or healthcare provider to get one. Talk to your pediatrician regarding the use of this medicine in children. Special care may be needed. Overdosage: If you think you have taken too much of this medicine contact a poison control center or emergency room at once. NOTE: This medicine is only for you. Do not share this medicine with others. What if I miss a dose? It is  important not to miss your dose. Call your doctor or health care professional if you miss your dose. If you miss a dose due to an On-body Injector failure or leakage, a new dose should be administered as soon as possible using a single prefilled syringe for manual use. What may interact with this medicine? Interactions have not been studied. Give your health care provider a list of all the medicines, herbs, non-prescription drugs, or dietary supplements you use. Also tell them if you smoke, drink alcohol, or use illegal drugs. Some items may interact with your medicine. This list may not describe all possible interactions. Give your health care provider a list of all the medicines, herbs, non-prescription drugs, or dietary supplements you use. Also tell them if you smoke, drink alcohol, or use illegal drugs. Some items may interact with your medicine. What should I watch for while using this medicine? You may need blood work done while you are taking this medicine. If you are going to need a MRI, CT scan, or other procedure, tell your doctor that you are using this medicine (On-Body Injector only). What side effects may I notice from receiving this medicine? Side effects that you should report to your doctor or health care professional as soon as possible: -allergic reactions like skin rash, itching or hives, swelling of the face, lips, or tongue -dizziness -fever -pain, redness, or irritation at site where injected -pinpoint red spots on the skin -shortness of breath or breathing problems -stomach or side pain, or pain at the shoulder -swelling -tiredness -trouble passing urine Side effects that usually do not require medical attention (report to your doctor   or health care professional if they continue or are bothersome): -bone pain -muscle pain This list may not describe all possible side effects. Call your doctor for medical advice about side effects. You may report side effects to FDA at  1-800-FDA-1088. Where should I keep my medicine? Keep out of the reach of children. Store pre-filled syringes in a refrigerator between 2 and 8 degrees C (36 and 46 degrees F). Do not freeze. Keep in carton to protect from light. Throw away this medicine if it is left out of the refrigerator for more than 48 hours. Throw away any unused medicine after the expiration date. NOTE: This sheet is a summary. It may not cover all possible information. If you have questions about this medicine, talk to your doctor, pharmacist, or health care provider.  2015, Elsevier/Gold Standard. (2013-07-17 16:14:05)  

## 2014-05-11 ENCOUNTER — Ambulatory Visit: Payer: Medicare Other

## 2014-05-13 ENCOUNTER — Ambulatory Visit (HOSPITAL_COMMUNITY)
Admission: RE | Admit: 2014-05-13 | Discharge: 2014-05-13 | Disposition: A | Discharge status: Home / Self Care | Payer: Medicare Other | Source: Ambulatory Visit | Attending: Internal Medicine | Admitting: Internal Medicine

## 2014-05-13 DIAGNOSIS — I429 Cardiomyopathy, unspecified: Secondary | ICD-10-CM | POA: Insufficient documentation

## 2014-05-13 MED ORDER — GADOBENATE DIMEGLUMINE 529 MG/ML IV SOLN
30.0000 mL | Freq: Once | INTRAVENOUS | Status: AC | PRN
  Administered 2014-05-13: 30 mL via INTRAVENOUS

## 2014-05-13 MED ORDER — REGADENOSON 0.4 MG/5ML IV SOLN
INTRAVENOUS | Status: AC
  Filled 2014-05-13: qty 5

## 2014-05-15 ENCOUNTER — Other Ambulatory Visit: Payer: Self-pay

## 2014-05-15 DIAGNOSIS — C50411 Malignant neoplasm of upper-outer quadrant of right female breast: Secondary | ICD-10-CM

## 2014-05-15 MED ORDER — FLUCONAZOLE 100 MG PO TABS: 100.0000 mg | ORAL_TABLET | Freq: Every day | ORAL | Status: DC

## 2014-05-15 NOTE — Progress Notes (Signed)
Let pt know Dr. Lindi Adie ordered Diflucan for vaginal yeast infection.  Pt voiced understanding.  Prescription escribed to [facility] - receipt confirmed.

## 2014-05-18 ENCOUNTER — Telehealth (HOSPITAL_COMMUNITY): Payer: Self-pay | Admitting: Vascular Surgery

## 2014-05-18 NOTE — Telephone Encounter (Signed)
Pt had a Cardiac MRI 05/13/14 she would like her results.. Please advise

## 2014-05-18 NOTE — Telephone Encounter (Signed)
Results given verbally to patient, aware and appreciative.  No questions or concerns.  Renee Pain

## 2014-05-22 ENCOUNTER — Other Ambulatory Visit (HOSPITAL_BASED_OUTPATIENT_CLINIC_OR_DEPARTMENT_OTHER): Payer: Medicare Other

## 2014-05-22 ENCOUNTER — Telehealth: Payer: Self-pay | Admitting: Hematology and Oncology

## 2014-05-22 ENCOUNTER — Telehealth: Payer: Self-pay | Admitting: *Deleted

## 2014-05-22 ENCOUNTER — Ambulatory Visit (HOSPITAL_BASED_OUTPATIENT_CLINIC_OR_DEPARTMENT_OTHER): Payer: Medicare Other

## 2014-05-22 ENCOUNTER — Ambulatory Visit (HOSPITAL_BASED_OUTPATIENT_CLINIC_OR_DEPARTMENT_OTHER): Payer: Medicare Other | Admitting: Hematology and Oncology

## 2014-05-22 VITALS — BP 105/67 | HR 83 | Temp 98.7°F | Resp 18 | Ht 66.0 in | Wt 191.9 lb

## 2014-05-22 DIAGNOSIS — R110 Nausea: Secondary | ICD-10-CM | POA: Diagnosis not present

## 2014-05-22 DIAGNOSIS — D702 Other drug-induced agranulocytosis: Secondary | ICD-10-CM

## 2014-05-22 DIAGNOSIS — R439 Unspecified disturbances of smell and taste: Secondary | ICD-10-CM | POA: Diagnosis not present

## 2014-05-22 DIAGNOSIS — C50411 Malignant neoplasm of upper-outer quadrant of right female breast: Secondary | ICD-10-CM

## 2014-05-22 DIAGNOSIS — C50811 Malignant neoplasm of overlapping sites of right female breast: Secondary | ICD-10-CM | POA: Diagnosis not present

## 2014-05-22 DIAGNOSIS — Z5111 Encounter for antineoplastic chemotherapy: Secondary | ICD-10-CM

## 2014-05-22 DIAGNOSIS — B370 Candidal stomatitis: Secondary | ICD-10-CM | POA: Diagnosis not present

## 2014-05-22 LAB — COMPREHENSIVE METABOLIC PANEL (CC13)
ALT: 16 U/L (ref 0–55)
ANION GAP: 9 meq/L (ref 3–11)
AST: 15 U/L (ref 5–34)
Albumin: 3.3 g/dL — ABNORMAL LOW (ref 3.5–5.0)
Alkaline Phosphatase: 95 U/L (ref 40–150)
BILIRUBIN TOTAL: 0.28 mg/dL (ref 0.20–1.20)
BUN: 10.9 mg/dL (ref 7.0–26.0)
CHLORIDE: 105 meq/L (ref 98–109)
CO2: 23 meq/L (ref 22–29)
Calcium: 8.8 mg/dL (ref 8.4–10.4)
Creatinine: 0.7 mg/dL (ref 0.6–1.1)
EGFR: >90 mL/min/{1.73_m2} (ref >90)
Glucose: 102 mg/dl (ref 70–140)
POTASSIUM: 4.1 meq/L (ref 3.5–5.1)
Sodium: 137 mEq/L (ref 136–145)
Total Protein: 6.3 g/dL — ABNORMAL LOW (ref 6.4–8.3)

## 2014-05-22 LAB — CBC WITH DIFFERENTIAL/PLATELET
BASO%: 0.4 % (ref 0.0–2.0)
Basophils Absolute: 0 10*3/uL (ref 0.0–0.1)
EOS ABS: 0 10*3/uL (ref 0.0–0.5)
EOS%: 0.5 % (ref 0.0–7.0)
HEMATOCRIT: 33.1 % — AB (ref 34.8–46.6)
HEMOGLOBIN: 10.9 g/dL — AB (ref 11.6–15.9)
LYMPH#: 1.3 10*3/uL (ref 0.9–3.3)
LYMPH%: 15.6 % (ref 14.0–49.7)
MCH: 28.8 pg (ref 25.1–34.0)
MCHC: 32.9 g/dL (ref 31.5–36.0)
MCV: 87.3 fL (ref 79.5–101.0)
MONO#: 1.2 10*3/uL — AB (ref 0.1–0.9)
MONO%: 15 % — ABNORMAL HIGH (ref 0.0–14.0)
NEUT%: 68.5 % (ref 38.4–76.8)
NEUTROS ABS: 5.5 10*3/uL (ref 1.5–6.5)
Platelets: 292 10*3/uL (ref 145–400)
RBC: 3.79 10*6/uL (ref 3.70–5.45)
RDW: 15.3 % — AB (ref 11.2–14.5)
WBC: 8.1 10*3/uL (ref 3.9–10.3)

## 2014-05-22 MED ORDER — PALONOSETRON HCL INJECTION 0.25 MG/5ML
0.2500 mg | Freq: Once | INTRAVENOUS | Status: AC
  Administered 2014-05-22: 0.25 mg via INTRAVENOUS

## 2014-05-22 MED ORDER — PEGFILGRASTIM 6 MG/0.6ML ~~LOC~~ PSKT
6.0000 mg | PREFILLED_SYRINGE | Freq: Once | SUBCUTANEOUS | Status: AC
  Administered 2014-05-22: 6 mg via SUBCUTANEOUS
  Filled 2014-05-22: qty 0.6

## 2014-05-22 MED ORDER — DEXAMETHASONE SODIUM PHOSPHATE 20 MG/5ML IJ SOLN
12.0000 mg | Freq: Once | INTRAMUSCULAR | Status: AC
  Administered 2014-05-22: 12 mg via INTRAVENOUS

## 2014-05-22 MED ORDER — DEXAMETHASONE SODIUM PHOSPHATE 20 MG/5ML IJ SOLN
INTRAMUSCULAR | Status: AC
  Filled 2014-05-22: qty 5

## 2014-05-22 MED ORDER — LACTATED RINGERS IV SOLN
490.0000 mg/m2 | Freq: Once | INTRAVENOUS | Status: AC
  Administered 2014-05-22: 1000 mg via INTRAVENOUS
  Filled 2014-05-22: qty 100

## 2014-05-22 MED ORDER — PALONOSETRON HCL INJECTION 0.25 MG/5ML
INTRAVENOUS | Status: AC
  Filled 2014-05-22: qty 5

## 2014-05-22 MED ORDER — SODIUM CHLORIDE 0.9 % IV SOLN
500.0000 mg/m2 | Freq: Once | INTRAVENOUS | Status: AC
  Administered 2014-05-22: 1020 mg via INTRAVENOUS
  Filled 2014-05-22: qty 51

## 2014-05-22 MED ORDER — DOXORUBICIN HCL CHEMO IV INJECTION 2 MG/ML
50.0000 mg/m2 | Freq: Once | INTRAVENOUS | Status: AC
  Administered 2014-05-22: 102 mg via INTRAVENOUS
  Filled 2014-05-22: qty 51

## 2014-05-22 MED ORDER — SODIUM CHLORIDE 0.9 % IV SOLN
150.0000 mg | Freq: Once | INTRAVENOUS | Status: AC
  Administered 2014-05-22: 150 mg via INTRAVENOUS
  Filled 2014-05-22: qty 5

## 2014-05-22 MED ORDER — FLUCONAZOLE 100 MG PO TABS: 100.0000 mg | ORAL_TABLET | Freq: Every day | ORAL | Status: DC

## 2014-05-22 MED ORDER — SODIUM CHLORIDE 0.9 % IV SOLN
Freq: Once | INTRAVENOUS | Status: AC
  Administered 2014-05-22: 09:00:00 via INTRAVENOUS

## 2014-05-22 MED ORDER — LACTATED RINGERS IV SOLN
500.0000 mg/m2 | Freq: Once | INTRAVENOUS | Status: DC
  Filled 2014-05-22: qty 102

## 2014-05-22 MED ORDER — HEPARIN SOD (PORK) LOCK FLUSH 100 UNIT/ML IV SOLN
500.0000 [IU] | Freq: Once | INTRAVENOUS | Status: AC | PRN
  Administered 2014-05-22: 500 [IU]
  Filled 2014-05-22: qty 5

## 2014-05-22 MED ORDER — SODIUM CHLORIDE 0.9 % IJ SOLN
10.0000 mL | INTRAMUSCULAR | Status: DC | PRN
  Administered 2014-05-22: 10 mL
  Filled 2014-05-22: qty 10

## 2014-05-22 NOTE — Telephone Encounter (Signed)
perpof to sch pt appt-sent MW email to sch trmt-pt has copy of sch-pt aware

## 2014-05-22 NOTE — Progress Notes (Signed)
Patient Care Team: Laurey Morale, MD as PCP - General Excell Seltzer, MD as Consulting Physician (General Surgery) Rulon Eisenmenger, MD as Consulting Physician (Hematology and Oncology) Rexene Edison, MD as Consulting Physician (Radiation Oncology) Trinda Pascal, NP as Nurse Practitioner (Nurse Practitioner)  DIAGNOSIS: Breast cancer of upper-outer quadrant of right female breast   Staging form: Breast, AJCC 7th Edition     Clinical stage from 04/01/2014: Stage IIB (T3, N0, M0) - Unsigned       Staging comments: Staged at breast conference on 12.2.15    SUMMARY OF ONCOLOGIC HISTORY:   Breast cancer of upper-outer quadrant of right female breast   03/18/2014 Mammogram Right breast suspicious mass 10:00 4.9 x 3 x 5 cm, 2 lower right axillary lymph nodes mildly thickened cortices   03/23/2014 Initial Biopsy Right breast needle biopsy 9:00: Invasive ductal carcinoma grade 3, ER PR HER-2 negative ratio 1.3   03/31/2014 Breast MRI Right breast mass 9:00 upper outer quadrant with contiguous skin involvement by 5.7 cm, right axillary lymph nodes normal in size   04/10/2014 -  Neo-Adjuvant Chemotherapy Dose dense Adriamycin and Cytoxan 4 followed by weekly Taxol 12    CHIEF COMPLIANT: Cycle 4dense Adriamycin and Cytoxan  INTERVAL HISTORY: ALYRICA THUROW is a 69 year old lady with above-mentioned history of right-sided breast cancer currently on neoadjuvant chemotherapy. Today is cycle 4 of dose dense Adriamycin and Cytoxan. Overall she tolerated chemotherapy fairly well. She had a couple of days of mild nausea. She was more tired than before. Denies any fevers or chills. She has cold symptoms which have improved. She had a yeast infection which also improved markedly with Diflucan. But she still has some residual yeast symptoms. So we will renew her Diflucan today.  REVIEW OF SYSTEMS:   Constitutional: Denies fevers, chills or abnormal weight loss Eyes: Denies blurriness of  vision Ears, nose, mouth, throat, and face: Slight thrush on the back of the throat Respiratory: Denies cough, dyspnea or wheezes Cardiovascular: Denies palpitation, chest discomfort or lower extremity swelling Gastrointestinal:  Denies nausea, heartburn or change in bowel habits Skin: Denies abnormal skin rashes Lymphatics: Denies new lymphadenopathy or easy bruising Neurological:Denies numbness, tingling or new weaknesses Behavioral/Psych: Mood is stable, no new changes  Breast:  denies any pain or lumps or nodules in either breasts All other systems were reviewed with the patient and are negative.  I have reviewed the past medical history, past surgical history, social history and family history with the patient and they are unchanged from previous note.  ALLERGIES:  has No Known Allergies.  MEDICATIONS:  Current Outpatient Prescriptions  Medication Sig Dispense Refill  . amoxicillin (AMOXIL) 500 MG capsule Take 1 capsule (500 mg total) by mouth 2 (two) times daily. 14 capsule 0  . Ascorbic Acid (VITAMIN C) 500 MG tablet Take 500 mg by mouth 2 (two) times daily.     Marland Kitchen aspirin 81 MG tablet Take 81 mg by mouth every morning.     . beta carotene w/minerals (OCUVITE) tablet Take 1 tablet by mouth every morning.     . Calcium Carb-Cholecalciferol (CALCIUM 600/VITAMIN D3) 600-800 MG-UNIT TABS Take 1 tablet by mouth every morning.    . carvedilol (COREG) 3.125 MG tablet Take 1 tablet (3.125 mg total) by mouth 2 (two) times daily. 180 tablet 3  . cetirizine (ZYRTEC) 10 MG tablet Take 10 mg by mouth every morning.     . clonazePAM (KLONOPIN) 2 MG tablet Take 1 tablet (2 mg  total) by mouth 2 (two) times daily as needed for anxiety. (Patient taking differently: Take 1 mg by mouth 2 (two) times daily as needed for anxiety. ) 180 tablet 1  . cyclobenzaprine (FLEXERIL) 10 MG tablet Take 1 tablet (10 mg total) by mouth 3 (three) times daily as needed for muscle spasms. For spasms 90 tablet 5  .  dexamethasone (DECADRON) 4 MG tablet Take 2 tablets by mouth once a day on the day after chemotherapy and then take 2 tablets two times a day for 2 days. Take with food. 30 tablet 1  . fish oil-omega-3 fatty acids 1000 MG capsule Take 1 g by mouth every morning.     . fluconazole (DIFLUCAN) 100 MG tablet Take 1 tablet (100 mg total) by mouth daily. 7 tablet 0  . fluticasone (FLONASE) 50 MCG/ACT nasal spray Place 2 sprays into the nose daily. (Patient taking differently: Place 1 spray into the nose 2 (two) times daily. ) 48 g 3  . HYDROcodone-acetaminophen (NORCO/VICODIN) 5-325 MG per tablet Take 1-2 tablets by mouth every 4 (four) hours as needed for moderate pain or severe pain. 25 tablet 0  . ibuprofen (ADVIL,MOTRIN) 200 MG tablet Take 200 mg by mouth every 6 (six) hours as needed for headache or mild pain.    Marland Kitchen ketorolac (ACULAR) 0.5 % ophthalmic solution   0  . latanoprost (XALATAN) 0.005 % ophthalmic solution Place 1 drop into both eyes at bedtime.    Marland Kitchen levofloxacin (LEVAQUIN) 750 MG tablet Take 1 tablet (750 mg total) by mouth daily. 7 tablet 0  . lidocaine-prilocaine (EMLA) cream Apply to affected area once (Patient taking differently: Apply 1 application topically as needed (for port). ) 30 g 3  . lisinopril (PRINIVIL,ZESTRIL) 10 MG tablet Take 1 tablet (10 mg total) by mouth daily. (Patient taking differently: Take 10 mg by mouth at bedtime. ) 90 tablet 3  . LORazepam (ATIVAN) 0.5 MG tablet Take 1 tablet (0.5 mg total) by mouth every 6 (six) hours as needed (Nausea or vomiting). 30 tablet 0  . Magnesium 500 MG TABS Take 1 tablet by mouth at bedtime.    . metoCLOPramide (REGLAN) 10 MG tablet   5  . metroNIDAZOLE (METROGEL) 0.75 % gel Apply 1 application topically 2 (two) times daily as needed (rosacea on face.).     Marland Kitchen nystatin (MYCOSTATIN) 100000 UNIT/ML suspension Take 5 mLs (500,000 Units total) by mouth 4 (four) times daily. 60 mL 0  . ondansetron (ZOFRAN) 8 MG tablet Take 1 tablet (8 mg  total) by mouth 2 (two) times daily as needed. Start on the third day after chemotherapy. 30 tablet 1  . prochlorperazine (COMPAZINE) 10 MG tablet Take 1 tablet (10 mg total) by mouth every 6 (six) hours as needed (Nausea or vomiting). 30 tablet 1  . timolol (BETIMOL) 0.5 % ophthalmic solution Place 1 drop into both eyes every morning.    Marland Kitchen UNABLE TO FIND Apply 1 each topically as needed. 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 1 each 1   No current facility-administered medications for this visit.    PHYSICAL EXAMINATION: ECOG PERFORMANCE STATUS: 1 - Symptomatic but completely ambulatory  Filed Vitals:   05/22/14 0856  BP: 105/67  Pulse: 83  Temp: 98.7 F (37.1 C)  Resp: 18   Filed Weights   05/22/14 0856  Weight: 191 lb 14.4 oz (87.045 kg)    GENERAL:alert, no distress and comfortable SKIN: skin color, texture, turgor are normal,  no rashes or significant lesions EYES: normal, Conjunctiva are pink and non-injected, sclera clear OROPHARYNX:no exudate, no erythema and lips, buccal mucosa, and tongue normal  NECK: supple, thyroid normal size, non-tender, without nodularity LYMPH:  no palpable lymphadenopathy in the cervical, axillary or inguinal LUNGS: clear to auscultation and percussion with normal breathing effort HEART: regular rate & rhythm and no murmurs and no lower extremity edema ABDOMEN:abdomen soft, non-tender and normal bowel sounds Musculoskeletal:no cyanosis of digits and no clubbing  NEURO: alert & oriented x 3 with fluent speech, no focal motor/sensory deficits  LABORATORY DATA:  I have reviewed the data as listed   Chemistry      Component Value Date/Time   NA 137 05/08/2014 0810   NA 134* 02/12/2014 1108   K 3.9 05/08/2014 0810   K 4.7 02/12/2014 1108   CL 100 02/12/2014 1108   CO2 23 05/08/2014 0810   CO2 25 02/12/2014 1108   BUN 11.1 05/08/2014 0810   BUN 10 02/12/2014 1108   CREATININE 0.7 05/08/2014  0810   CREATININE 0.8 02/12/2014 1108      Component Value Date/Time   CALCIUM 9.6 05/08/2014 0810   CALCIUM 9.5 02/12/2014 1108   ALKPHOS 119 05/08/2014 0810   ALKPHOS 71 02/12/2014 1108   AST 15 05/08/2014 0810   AST 22 02/12/2014 1108   ALT 18 05/08/2014 0810   ALT 22 02/12/2014 1108   BILITOT 0.30 05/08/2014 0810   BILITOT 0.5 02/12/2014 1108       Lab Results  Component Value Date   WBC 8.1 05/22/2014   HGB 10.9* 05/22/2014   HCT 33.1* 05/22/2014   MCV 87.3 05/22/2014   PLT 292 05/22/2014   NEUTROABS 5.5 05/22/2014    ASSESSMENT & PLAN:  Breast cancer of upper-outer quadrant of right female breast Right breast invasive ductal carcinoma, palpable mass, 5 cm by mammogram and 5.7 cm by MRI grade 3, ER PR HER-2 negative, associated skin dimpling, T3, N0, M0 stage IIB Ki-67 90%  Current treatment: Neoadjuvant chemotherapy with dose dense Adriamycin and Cytoxan 04/10/2014. Plan is for 4 cycles followed by weekly Taxol x12; today is cycle 4 day 1 of dose dense AC  Chemotherapy-related toxicities: 1. Chemotherapy induced neutropenia grade 3: Chemotherapy dosage reduced for cycle 2 2. Nausea grade 1 3. Loss of taste 4. Decreased appetite  5. Bronchitis after cycle 2 treated with amoxicillin 6. Yeast infection: Being treated with Diflucan 7. Thrush in the throat: Diflucan should help this.  Cardiomyopathy: S/P MRI heart. Stable. Patient currently getting dexrazoxane which is keeping her heart stable.  Monitoring closely for toxicities. Reviewed her blood work and they appear to be adequate for treatment today    Orders Placed This Encounter  Procedures  . CBC with Differential    Standing Status: Future     Number of Occurrences:      Standing Expiration Date: 05/22/2015  . Comprehensive metabolic panel (Cmet) - [facility]    Standing Status: Future     Number of Occurrences:      Standing Expiration Date: 05/22/2015   The patient has a good understanding of the  overall plan. she agrees with it. She will call with any problems that may develop before her next visit here.   Rulon Eisenmenger, MD

## 2014-05-22 NOTE — Assessment & Plan Note (Addendum)
Right breast invasive ductal carcinoma, palpable mass, 5 cm by mammogram and 5.7 cm by MRI grade 3, ER PR HER-2 negative, associated skin dimpling, T3, N0, M0 stage IIB Ki-67 90%  Current treatment: Neoadjuvant chemotherapy with dose dense Adriamycin and Cytoxan 04/10/2014. Plan is for 4 cycles followed by weekly Taxol x12; today is cycle 4 day 1 of dose dense AC  Chemotherapy-related toxicities: 1. Chemotherapy induced neutropenia grade 3: Chemotherapy dosage reduced for cycle 2 2. Nausea grade 1 3. Loss of taste 4. Decreased appetite  5. Bronchitis after cycle 2 treated with amoxicillin  Cardiomyopathy: S/P MRI heart. Stable   Monitoring closely for toxicities. Reviewed her blood work and they appear to be adequate for treatment today

## 2014-05-22 NOTE — Patient Instructions (Signed)
[facility] Discharge Instructions for Patients Receiving Chemotherapy  Today you received the following chemotherapy agents Adriamycin, Cytoxan  To help prevent nausea and vomiting after your treatment, we encourage you to take your nausea medication as directed/prescribed   If you develop nausea and vomiting that is not controlled by your nausea medication, call the clinic.   BELOW ARE SYMPTOMS THAT SHOULD BE REPORTED IMMEDIATELY:  *FEVER GREATER THAN 100.5 F  *CHILLS WITH OR WITHOUT FEVER  NAUSEA AND VOMITING THAT IS NOT CONTROLLED WITH YOUR NAUSEA MEDICATION  *UNUSUAL SHORTNESS OF BREATH  *UNUSUAL BRUISING OR BLEEDING  TENDERNESS IN MOUTH AND THROAT WITH OR WITHOUT PRESENCE OF ULCERS  *URINARY PROBLEMS  *BOWEL PROBLEMS  UNUSUAL RASH Items with * indicate a potential emergency and should be followed up as soon as possible.  Feel free to call the clinic you have any questions or concerns. The clinic phone number is [phone].

## 2014-05-22 NOTE — Telephone Encounter (Signed)
Per staff message and POF I have scheduled appts. Advised scheduler of appts. JMW  

## 2014-05-26 ENCOUNTER — Other Ambulatory Visit: Payer: Self-pay | Admitting: Family Medicine

## 2014-06-02 ENCOUNTER — Other Ambulatory Visit: Payer: Self-pay | Admitting: Nurse Practitioner

## 2014-06-02 ENCOUNTER — Other Ambulatory Visit: Payer: Self-pay | Admitting: *Deleted

## 2014-06-02 ENCOUNTER — Telehealth: Payer: Self-pay | Admitting: *Deleted

## 2014-06-02 DIAGNOSIS — N390 Urinary tract infection, site not specified: Secondary | ICD-10-CM

## 2014-06-02 MED ORDER — CIPROFLOXACIN HCL 500 MG PO TABS: 500.0000 mg | ORAL_TABLET | Freq: Two times a day (BID) | ORAL | Status: DC

## 2014-06-02 NOTE — Telephone Encounter (Addendum)
VERBAL ORDER AND READ BACK TO CINDEE BACON,NP- SEE MEDICATION LIST FOR PRESCRIPTION. PT. TO USE BAKING SODA AND SALT SOLUTION TO SWISH AND SPIT IF THRUSH OCCURS. PT. TO NOTIFY THIS OFFICE IF SHE DEVELOPS A YEAST INFECTION. NOTIFIED PT. OF THE ABOVE INSTRUCTIONS.

## 2014-06-02 NOTE — Telephone Encounter (Signed)
@  [id number] Provider input needed: POSSIBLE VISIT  Patient Name: Stephanie Olsen  MRN: [id number] DOB: [birth year]  Date: @TODAY @ Telephone: [phone] (home)  CSN: [id number]   Allergies: has No Known Allergies.      Reason for call:UTI  Chief Complaint  Patient presents with  . Urinary Tract Infection    PT. HAS PAINFUL URINATION, FREQUENCY WITH DECREASED OUTPUT, AND RIGHT LOWER BACK PAIN       Patient last received chemotherapy/ treatment on: 05/22/14  Patient was last seen in the office on 05/22/14  Next appt is 06/05/14      Is patient having fevers greater than 100.5? Yes []    No []    Comments:PT. HAD CHILLS LAST NIGHT BUT "COULD NOT GET HER DIGITAL THERMOMETER TO WORK."    Is patient having uncontrolled pain, or new pain? Yes [x]    No []    Comments:NEW PAIN AS ABOVE   Is patient having new back pain that changes with position Yes []                                   (worsens or eases when laying down?) No [x]    Comments:     Is patient able to eat and drink? Yes [x]    No []    Comments:    Is patient able to pass stool without difficulty?  Yes []    No []    Comments:     Is patient having uncontrolled nausea?  Yes []    No []    Comments:  Summary Based on the above information advised patient to  AWAIT A RETURN CALL.   @[facility] @  06/02/2014, 9:26 AM

## 2014-06-05 ENCOUNTER — Ambulatory Visit (HOSPITAL_BASED_OUTPATIENT_CLINIC_OR_DEPARTMENT_OTHER): Payer: Medicare Other

## 2014-06-05 ENCOUNTER — Other Ambulatory Visit (HOSPITAL_BASED_OUTPATIENT_CLINIC_OR_DEPARTMENT_OTHER): Payer: Medicare Other

## 2014-06-05 ENCOUNTER — Ambulatory Visit (HOSPITAL_BASED_OUTPATIENT_CLINIC_OR_DEPARTMENT_OTHER): Payer: Medicare Other | Admitting: Hematology and Oncology

## 2014-06-05 ENCOUNTER — Telehealth: Payer: Self-pay | Admitting: *Deleted

## 2014-06-05 VITALS — BP 105/43 | HR 88 | Temp 98.3°F | Resp 18 | Ht 66.0 in | Wt 189.2 lb

## 2014-06-05 DIAGNOSIS — N390 Urinary tract infection, site not specified: Secondary | ICD-10-CM | POA: Diagnosis not present

## 2014-06-05 DIAGNOSIS — R5383 Other fatigue: Secondary | ICD-10-CM

## 2014-06-05 DIAGNOSIS — C50411 Malignant neoplasm of upper-outer quadrant of right female breast: Secondary | ICD-10-CM | POA: Diagnosis not present

## 2014-06-05 DIAGNOSIS — R630 Anorexia: Secondary | ICD-10-CM

## 2014-06-05 DIAGNOSIS — C50811 Malignant neoplasm of overlapping sites of right female breast: Secondary | ICD-10-CM

## 2014-06-05 DIAGNOSIS — R110 Nausea: Secondary | ICD-10-CM

## 2014-06-05 DIAGNOSIS — Z5111 Encounter for antineoplastic chemotherapy: Secondary | ICD-10-CM | POA: Diagnosis not present

## 2014-06-05 DIAGNOSIS — B379 Candidiasis, unspecified: Secondary | ICD-10-CM | POA: Diagnosis not present

## 2014-06-05 DIAGNOSIS — D701 Agranulocytosis secondary to cancer chemotherapy: Secondary | ICD-10-CM

## 2014-06-05 LAB — CBC WITH DIFFERENTIAL/PLATELET
BASO%: 0.5 % (ref 0.0–2.0)
Basophils Absolute: 0 10*3/uL (ref 0.0–0.1)
EOS ABS: 0 10*3/uL (ref 0.0–0.5)
EOS%: 0.3 % (ref 0.0–7.0)
HEMATOCRIT: 31.2 % — AB (ref 34.8–46.6)
HGB: 10.5 g/dL — ABNORMAL LOW (ref 11.6–15.9)
LYMPH%: 13.4 % — AB (ref 14.0–49.7)
MCH: 29.6 pg (ref 25.1–34.0)
MCHC: 33.7 g/dL (ref 31.5–36.0)
MCV: 87.9 fL (ref 79.5–101.0)
MONO#: 0.9 10*3/uL (ref 0.1–0.9)
MONO%: 11.6 % (ref 0.0–14.0)
NEUT%: 74.2 % (ref 38.4–76.8)
NEUTROS ABS: 5.4 10*3/uL (ref 1.5–6.5)
NRBC: 1 % — AB (ref 0–0)
Platelets: 247 10*3/uL (ref 145–400)
RBC: 3.55 10*6/uL — ABNORMAL LOW (ref 3.70–5.45)
RDW: 16 % — ABNORMAL HIGH (ref 11.2–14.5)
WBC: 7.3 10*3/uL (ref 3.9–10.3)
lymph#: 1 10*3/uL (ref 0.9–3.3)

## 2014-06-05 LAB — COMPREHENSIVE METABOLIC PANEL (CC13)
ALBUMIN: 3.2 g/dL — AB (ref 3.5–5.0)
ALT: 18 U/L (ref 0–55)
ANION GAP: 8 meq/L (ref 3–11)
AST: 17 U/L (ref 5–34)
Alkaline Phosphatase: 106 U/L (ref 40–150)
BUN: 8.1 mg/dL (ref 7.0–26.0)
CALCIUM: 9.2 mg/dL (ref 8.4–10.4)
CO2: 22 mEq/L (ref 22–29)
CREATININE: 0.7 mg/dL (ref 0.6–1.1)
Chloride: 106 mEq/L (ref 98–109)
EGFR: 90 mL/min/{1.73_m2} — AB (ref >90)
GLUCOSE: 100 mg/dL (ref 70–140)
POTASSIUM: 3.9 meq/L (ref 3.5–5.1)
SODIUM: 137 meq/L (ref 136–145)
TOTAL PROTEIN: 6.3 g/dL — AB (ref 6.4–8.3)
Total Bilirubin: 0.29 mg/dL (ref 0.20–1.20)

## 2014-06-05 MED ORDER — DEXAMETHASONE SODIUM PHOSPHATE 20 MG/5ML IJ SOLN
INTRAMUSCULAR | Status: AC
  Filled 2014-06-05: qty 5

## 2014-06-05 MED ORDER — PACLITAXEL CHEMO INJECTION 300 MG/50ML
65.0000 mg/m2 | Freq: Once | INTRAVENOUS | Status: AC
  Administered 2014-06-05: 132 mg via INTRAVENOUS
  Filled 2014-06-05: qty 22

## 2014-06-05 MED ORDER — DIPHENHYDRAMINE HCL 50 MG/ML IJ SOLN
50.0000 mg | Freq: Once | INTRAMUSCULAR | Status: AC
  Administered 2014-06-05: 50 mg via INTRAVENOUS

## 2014-06-05 MED ORDER — SODIUM CHLORIDE 0.9 % IJ SOLN
10.0000 mL | INTRAMUSCULAR | Status: DC | PRN
  Administered 2014-06-05 (×2): 10 mL
  Filled 2014-06-05: qty 10

## 2014-06-05 MED ORDER — ONDANSETRON 8 MG/50ML IVPB (CHCC)
8.0000 mg | Freq: Once | INTRAVENOUS | Status: AC
  Administered 2014-06-05: 8 mg via INTRAVENOUS

## 2014-06-05 MED ORDER — CIPROFLOXACIN HCL 500 MG PO TABS: 500.0000 mg | ORAL_TABLET | Freq: Two times a day (BID) | ORAL | Status: DC

## 2014-06-05 MED ORDER — SODIUM CHLORIDE 0.9 % IV SOLN
Freq: Once | INTRAVENOUS | Status: AC
  Administered 2014-06-05: 10:00:00 via INTRAVENOUS

## 2014-06-05 MED ORDER — HEPARIN SOD (PORK) LOCK FLUSH 100 UNIT/ML IV SOLN
500.0000 [IU] | Freq: Once | INTRAVENOUS | Status: AC | PRN
  Administered 2014-06-05: 500 [IU]
  Filled 2014-06-05: qty 5

## 2014-06-05 MED ORDER — FAMOTIDINE IN NACL 20-0.9 MG/50ML-% IV SOLN
INTRAVENOUS | Status: AC
  Filled 2014-06-05: qty 50

## 2014-06-05 MED ORDER — ONDANSETRON 8 MG/NS 50 ML IVPB
INTRAVENOUS | Status: AC
  Filled 2014-06-05: qty 8

## 2014-06-05 MED ORDER — DIPHENHYDRAMINE HCL 50 MG/ML IJ SOLN
INTRAMUSCULAR | Status: AC
  Filled 2014-06-05: qty 1

## 2014-06-05 MED ORDER — DEXAMETHASONE SODIUM PHOSPHATE 20 MG/5ML IJ SOLN
20.0000 mg | Freq: Once | INTRAMUSCULAR | Status: AC
Start: 2014-06-05 — End: 2014-06-05
  Administered 2014-06-05: 20 mg via INTRAVENOUS

## 2014-06-05 MED ORDER — FAMOTIDINE IN NACL 20-0.9 MG/50ML-% IV SOLN
20.0000 mg | Freq: Once | INTRAVENOUS | Status: AC
  Administered 2014-06-05: 20 mg via INTRAVENOUS

## 2014-06-05 NOTE — Assessment & Plan Note (Addendum)
Right breast invasive ductal carcinoma, palpable mass, 5 cm by mammogram and 5.7 cm by MRI grade 3, ER PR HER-2 negative, associated skin dimpling, T3, N0, M0 stage IIB Ki-67 90%  Current treatment: Neoadjuvant chemotherapy with dose dense Adriamycin and Cytoxan 04/10/2014. Treatment Plan is for 4 cycles  Adriamycin and Cytoxanfollowed by weekly Taxol x12; Today is cycle 1 day 1 of  Weekly Taxol, I'm starting at a lower dosage of 65 mg/m weekly  Chemotherapy-related toxicities: 1. Chemotherapy induced neutropenia grade 3: Chemotherapy dosage reduced for cycle 2 2. Nausea grade 1 3. Loss of taste 4. Decreased appetite  5. Bronchitis after cycle 2 treated with amoxicillin 6. Yeast infection: improved with Diflucan 7. Thrush in the throat: resolved with Diflucan 8. UTI: Currently on Cipro I will extended by 5 more days 9. Fatigue: Seem to be cumulatively getting worse  Cardiomyopathy: S/P MRI heart. Stable. Patient  received dexrazoxane  During chemotherapy with Adriamycin.  Monitoring closely for toxicities.  Reviewed her blood work and they appear to be adequate for treatment today.  return to clinic in 1 week for follow-up

## 2014-06-05 NOTE — Telephone Encounter (Signed)
I have adjustted 2/12

## 2014-06-05 NOTE — Progress Notes (Signed)
Patient Care Team: Laurey Morale, MD as PCP - General Excell Seltzer, MD as Consulting Physician (General Surgery) Rulon Eisenmenger, MD as Consulting Physician (Hematology and Oncology) Rexene Edison, MD as Consulting Physician (Radiation Oncology) Trinda Pascal, NP as Nurse Practitioner (Nurse Practitioner)  DIAGNOSIS: Breast cancer of upper-outer quadrant of right female breast   Staging form: Breast, AJCC 7th Edition     Clinical stage from 04/01/2014: Stage IIB (T3, N0, M0) - Unsigned       Staging comments: Staged at breast conference on 12.2.15    SUMMARY OF ONCOLOGIC HISTORY:   Breast cancer of upper-outer quadrant of right female breast   03/18/2014 Mammogram Right breast suspicious mass 10:00 4.9 x 3 x 5 cm, 2 lower right axillary lymph nodes mildly thickened cortices   03/23/2014 Initial Biopsy Right breast needle biopsy 9:00: Invasive ductal carcinoma grade 3, ER PR HER-2 negative ratio 1.3   03/31/2014 Breast MRI Right breast mass 9:00 upper outer quadrant with contiguous skin involvement by 5.7 cm, right axillary lymph nodes normal in size   04/10/2014 -  Neo-Adjuvant Chemotherapy Dose dense Adriamycin and Cytoxan 4 followed by weekly Taxol 12    CHIEF COMPLIANT: Taxol cycle 1  INTERVAL HISTORY: Stephanie Olsen is a 69 year old lady with above-mentioned history of right-sided breast cancer currently on neoadjuvant chemotherapy. Today she starts weekly Taxol treatments. Since the last chemotherapy in energy levels have declined. She does not have any appetite and seems to be not eating as much. Her blood pressures also slightly low. She's been drinking a lot of water and liquids. Denies any nausea or vomiting. Just feels tired and weak.  REVIEW OF SYSTEMS:   Constitutional: Denies fevers, chills , feels tired and weak Eyes: Denies blurriness of vision Ears, nose, mouth, throat, and face: Denies mucositis or sore throat Respiratory: Denies cough, dyspnea or  wheezes Cardiovascular: Denies palpitation, chest discomfort or lower extremity swelling Gastrointestinal:  Denies nausea, heartburn or change in bowel habits Skin: Denies abnormal skin rashes Lymphatics: Denies new lymphadenopathy or easy bruising Neurological:Denies numbness, tingling or new weaknesses Behavioral/Psych: Mood is stable, no new changes  Breast: breast lump getting smaller All other systems were reviewed with the patient and are negative.  I have reviewed the past medical history, past surgical history, social history and family history with the patient and they are unchanged from previous note.  ALLERGIES:  has No Known Allergies.  MEDICATIONS:  Current Outpatient Prescriptions  Medication Sig Dispense Refill  . amoxicillin (AMOXIL) 500 MG capsule Take 1 capsule (500 mg total) by mouth 2 (two) times daily. 14 capsule 0  . Ascorbic Acid (VITAMIN C) 500 MG tablet Take 500 mg by mouth 2 (two) times daily.     Marland Kitchen aspirin 81 MG tablet Take 81 mg by mouth every morning.     . beta carotene w/minerals (OCUVITE) tablet Take 1 tablet by mouth every morning.     . Calcium Carb-Cholecalciferol (CALCIUM 600/VITAMIN D3) 600-800 MG-UNIT TABS Take 1 tablet by mouth every morning.    . carvedilol (COREG) 3.125 MG tablet Take 1 tablet (3.125 mg total) by mouth 2 (two) times daily. 180 tablet 3  . cetirizine (ZYRTEC) 10 MG tablet Take 10 mg by mouth every morning.     . ciprofloxacin (CIPRO) 500 MG tablet Take 1 tablet (500 mg total) by mouth 2 (two) times daily. 10 tablet 0  . clonazePAM (KLONOPIN) 2 MG tablet Take 1 tablet (2 mg total) by mouth 2 (  two) times daily as needed for anxiety. (Patient taking differently: Take 1 mg by mouth 2 (two) times daily as needed for anxiety. ) 180 tablet 1  . cyclobenzaprine (FLEXERIL) 10 MG tablet Take 1 tablet (10 mg total) by mouth 3 (three) times daily as needed for muscle spasms. For spasms 90 tablet 5  . fish oil-omega-3 fatty acids 1000 MG capsule  Take 1 g by mouth every morning.     . fluconazole (DIFLUCAN) 100 MG tablet Take 1 tablet (100 mg total) by mouth daily. 7 tablet 0  . fluticasone (FLONASE) 50 MCG/ACT nasal spray INSTILL 2 SPRAYS INTO THE NOSE DAILY 48 g 1  . HYDROcodone-acetaminophen (NORCO/VICODIN) 5-325 MG per tablet Take 1-2 tablets by mouth every 4 (four) hours as needed for moderate pain or severe pain. 25 tablet 0  . ibuprofen (ADVIL,MOTRIN) 200 MG tablet Take 200 mg by mouth every 6 (six) hours as needed for headache or mild pain.    Marland Kitchen ketorolac (ACULAR) 0.5 % ophthalmic solution   0  . latanoprost (XALATAN) 0.005 % ophthalmic solution Place 1 drop into both eyes at bedtime.    Marland Kitchen lisinopril (PRINIVIL,ZESTRIL) 10 MG tablet Take 1 tablet (10 mg total) by mouth daily. (Patient taking differently: Take 10 mg by mouth at bedtime. ) 90 tablet 3  . Magnesium 500 MG TABS Take 1 tablet by mouth at bedtime.    . metoCLOPramide (REGLAN) 10 MG tablet   5  . metroNIDAZOLE (METROGEL) 0.75 % gel Apply 1 application topically 2 (two) times daily as needed (rosacea on face.).     Marland Kitchen nystatin (MYCOSTATIN) 100000 UNIT/ML suspension Take 5 mLs (500,000 Units total) by mouth 4 (four) times daily. 60 mL 0  . timolol (BETIMOL) 0.5 % ophthalmic solution Place 1 drop into both eyes every morning.    Marland Kitchen UNABLE TO FIND Apply 1 each topically as needed. 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 1 each 1   No current facility-administered medications for this visit.    PHYSICAL EXAMINATION: ECOG PERFORMANCE STATUS: 1 - Symptomatic but completely ambulatory  Filed Vitals:   06/05/14 0859  BP: 105/43  Pulse: 88  Temp: 98.3 F (36.8 C)  Resp: 18   Filed Weights   06/05/14 0859  Weight: 189 lb 3.2 oz (85.821 kg)    GENERAL:alert, no distress and comfortable SKIN: skin color, texture, turgor are normal, no rashes or significant lesions EYES: normal, Conjunctiva are pink and non-injected, sclera  clear OROPHARYNX:no exudate, no erythema and lips, buccal mucosa, and tongue normal  NECK: supple, thyroid normal size, non-tender, without nodularity LYMPH:  no palpable lymphadenopathy in the cervical, axillary or inguinal LUNGS: clear to auscultation and percussion with normal breathing effort HEART: regular rate & rhythm and no murmurs and no lower extremity edema ABDOMEN:abdomen soft, non-tender and normal bowel sounds Musculoskeletal:no cyanosis of digits and no clubbing  NEURO: alert & oriented x 3 with fluent speech, no focal motor/sensory deficits   LABORATORY DATA:  I have reviewed the data as listed   Chemistry      Component Value Date/Time   NA 137 06/05/2014 0854   NA 134* 02/12/2014 1108   K 3.9 06/05/2014 0854   K 4.7 02/12/2014 1108   CL 100 02/12/2014 1108   CO2 22 06/05/2014 0854   CO2 25 02/12/2014 1108   BUN 8.1 06/05/2014 0854   BUN 10 02/12/2014 1108   CREATININE 0.7 06/05/2014 0854   CREATININE 0.8 02/12/2014 1108  Component Value Date/Time   CALCIUM 9.2 06/05/2014 0854   CALCIUM 9.5 02/12/2014 1108   ALKPHOS 106 06/05/2014 0854   ALKPHOS 71 02/12/2014 1108   AST 17 06/05/2014 0854   AST 22 02/12/2014 1108   ALT 18 06/05/2014 0854   ALT 22 02/12/2014 1108   BILITOT 0.29 06/05/2014 0854   BILITOT 0.5 02/12/2014 1108       Lab Results  Component Value Date   WBC 7.3 06/05/2014   HGB 10.5* 06/05/2014   HCT 31.2* 06/05/2014   MCV 87.9 06/05/2014   PLT 247 06/05/2014   NEUTROABS 5.4 06/05/2014     RADIOGRAPHIC STUDIES: I have personally reviewed the radiology reports and agreed with their findings. No results found.   ASSESSMENT & PLAN:  Breast cancer of upper-outer quadrant of right female breast Right breast invasive ductal carcinoma, palpable mass, 5 cm by mammogram and 5.7 cm by MRI grade 3, ER PR HER-2 negative, associated skin dimpling, T3, N0, M0 stage IIB Ki-67 90%  Current treatment: Neoadjuvant chemotherapy with dose dense  Adriamycin and Cytoxan 04/10/2014. Treatment Plan is for 4 cycles  Adriamycin and Cytoxanfollowed by weekly Taxol x12; Today is cycle 1 day 1 of  Weekly Taxol, I'm starting at a lower dosage of 65 mg/m weekly  Chemotherapy-related toxicities: 1. Chemotherapy induced neutropenia grade 3: Chemotherapy dosage reduced for cycle 2 2. Nausea grade 1 3. Loss of taste 4. Decreased appetite  5. Bronchitis after cycle 2 treated with amoxicillin 6. Yeast infection: improved with Diflucan 7. Thrush in the throat: resolved with Diflucan 8. UTI: Currently on Cipro I will extended by 5 more days 9. Fatigue: Seem to be cumulatively getting worse  Cardiomyopathy: S/P MRI heart. Stable. Patient  received dexrazoxane  During chemotherapy with Adriamycin.  Monitoring closely for toxicities.  Reviewed her blood work and they appear to be adequate for treatment today.  return to clinic in 1 week for follow-up     No orders of the defined types were placed in this encounter.   The patient has a good understanding of the overall plan. she agrees with it. She will call with any problems that may develop before her next visit here.   Rulon Eisenmenger, MD

## 2014-06-05 NOTE — Patient Instructions (Addendum)
[facility] Discharge Instructions for Patients Receiving Chemotherapy  Today you received the following chemotherapy agents Taxol To help prevent nausea and vomiting after your treatment, we encourage you to take your nausea medication as prescribed.   If you develop nausea and vomiting that is not controlled by your nausea medication, call the clinic.   BELOW ARE SYMPTOMS THAT SHOULD BE REPORTED IMMEDIATELY:  *FEVER GREATER THAN 100.5 F  *CHILLS WITH OR WITHOUT FEVER  NAUSEA AND VOMITING THAT IS NOT CONTROLLED WITH YOUR NAUSEA MEDICATION  *UNUSUAL SHORTNESS OF BREATH  *UNUSUAL BRUISING OR BLEEDING  TENDERNESS IN MOUTH AND THROAT WITH OR WITHOUT PRESENCE OF ULCERS  *URINARY PROBLEMS  *BOWEL PROBLEMS  UNUSUAL RASH Items with * indicate a potential emergency and should be followed up as soon as possible.  Feel free to call the clinic you have any questions or concerns. The clinic phone number is [phone].   Paclitaxel injection What is this medicine? PACLITAXEL (PAK li TAX el) is a chemotherapy drug. It targets fast dividing cells, like cancer cells, and causes these cells to die. This medicine is used to treat ovarian cancer, breast cancer, and other cancers. This medicine may be used for other purposes; ask your health care provider or pharmacist if you have questions. COMMON BRAND NAME(S): Onxol, Taxol What should I tell my health care provider before I take this medicine? They need to know if you have any of these conditions: -blood disorders -irregular heartbeat -infection (especially a virus infection such as chickenpox, cold sores, or herpes) -liver disease -previous or ongoing radiation therapy -an unusual or allergic reaction to paclitaxel, alcohol, polyoxyethylated castor oil, other chemotherapy agents, other medicines, foods, dyes, or preservatives -pregnant or trying to get pregnant -breast-feeding How should I use this medicine? This  drug is given as an infusion into a vein. It is administered in a hospital or clinic by a specially trained health care professional. Talk to your pediatrician regarding the use of this medicine in children. Special care may be needed. Overdosage: If you think you have taken too much of this medicine contact a poison control center or emergency room at once. NOTE: This medicine is only for you. Do not share this medicine with others. What if I miss a dose? It is important not to miss your dose. Call your doctor or health care professional if you are unable to keep an appointment. What may interact with this medicine? Do not take this medicine with any of the following medications: -disulfiram -metronidazole This medicine may also interact with the following medications: -cyclosporine -diazepam -ketoconazole -medicines to increase blood counts like filgrastim, pegfilgrastim, sargramostim -other chemotherapy drugs like cisplatin, doxorubicin, epirubicin, etoposide, teniposide, vincristine -quinidine -testosterone -vaccines -verapamil Talk to your doctor or health care professional before taking any of these medicines: -acetaminophen -aspirin -ibuprofen -ketoprofen -naproxen This list may not describe all possible interactions. Give your health care provider a list of all the medicines, herbs, non-prescription drugs, or dietary supplements you use. Also tell them if you smoke, drink alcohol, or use illegal drugs. Some items may interact with your medicine. What should I watch for while using this medicine? Your condition will be monitored carefully while you are receiving this medicine. You will need important blood work done while you are taking this medicine. This drug may make you feel generally unwell. This is not uncommon, as chemotherapy can affect healthy cells as well as cancer cells. Report any side effects. Continue your course of treatment even  though you feel ill unless your  doctor tells you to stop. In some cases, you may be given additional medicines to help with side effects. Follow all directions for their use. Call your doctor or health care professional for advice if you get a fever, chills or sore throat, or other symptoms of a cold or flu. Do not treat yourself. This drug decreases your body's ability to fight infections. Try to avoid being around people who are sick. This medicine may increase your risk to bruise or bleed. Call your doctor or health care professional if you notice any unusual bleeding. Be careful brushing and flossing your teeth or using a toothpick because you may get an infection or bleed more easily. If you have any dental work done, tell your dentist you are receiving this medicine. Avoid taking products that contain aspirin, acetaminophen, ibuprofen, naproxen, or ketoprofen unless instructed by your doctor. These medicines may hide a fever. Do not become pregnant while taking this medicine. Women should inform their doctor if they wish to become pregnant or think they might be pregnant. There is a potential for serious side effects to an unborn child. Talk to your health care professional or pharmacist for more information. Do not breast-feed an infant while taking this medicine. Men are advised not to father a child while receiving this medicine. What side effects may I notice from receiving this medicine? Side effects that you should report to your doctor or health care professional as soon as possible: -allergic reactions like skin rash, itching or hives, swelling of the face, lips, or tongue -low blood counts - This drug may decrease the number of white blood cells, red blood cells and platelets. You may be at increased risk for infections and bleeding. -signs of infection - fever or chills, cough, sore throat, pain or difficulty passing urine -signs of decreased platelets or bleeding - bruising, pinpoint red spots on the skin, black,  tarry stools, nosebleeds -signs of decreased red blood cells - unusually weak or tired, fainting spells, lightheadedness -breathing problems -chest pain -high or low blood pressure -mouth sores -nausea and vomiting -pain, swelling, redness or irritation at the injection site -pain, tingling, numbness in the hands or feet -slow or irregular heartbeat -swelling of the ankle, feet, hands Side effects that usually do not require medical attention (report to your doctor or health care professional if they continue or are bothersome): -bone pain -complete hair loss including hair on your head, underarms, pubic hair, eyebrows, and eyelashes -changes in the color of fingernails -diarrhea -loosening of the fingernails -loss of appetite -muscle or joint pain -red flush to skin -sweating This list may not describe all possible side effects. Call your doctor for medical advice about side effects. You may report side effects to FDA at 1-800-FDA-1088. Where should I keep my medicine? This drug is given in a hospital or clinic and will not be stored at home. NOTE: This sheet is a summary. It may not cover all possible information. If you have questions about this medicine, talk to your doctor, pharmacist, or health care provider.  2015, [employer] Standard. (2012-06-10 16:41:21)    Paclitaxel injection (Taxol) What is this medicine? PACLITAXEL (PAK li TAX el) is a chemotherapy drug. It targets fast dividing cells, like cancer cells, and causes these cells to die. This medicine is used to treat ovarian cancer, breast cancer, and other cancers. This medicine may be used for other purposes; ask your health care provider or pharmacist if you have  questions. COMMON BRAND NAME(S): Onxol, Taxol What should I tell my health care provider before I take this medicine? They need to know if you have any of these conditions: -blood disorders -irregular heartbeat -infection (especially a virus infection  such as chickenpox, cold sores, or herpes) -liver disease -previous or ongoing radiation therapy -an unusual or allergic reaction to paclitaxel, alcohol, polyoxyethylated castor oil, other chemotherapy agents, other medicines, foods, dyes, or preservatives -pregnant or trying to get pregnant -breast-feeding How should I use this medicine? This drug is given as an infusion into a vein. It is administered in a hospital or clinic by a specially trained health care professional. Talk to your pediatrician regarding the use of this medicine in children. Special care may be needed. Overdosage: If you think you have taken too much of this medicine contact a poison control center or emergency room at once. NOTE: This medicine is only for you. Do not share this medicine with others. What if I miss a dose? It is important not to miss your dose. Call your doctor or health care professional if you are unable to keep an appointment. What may interact with this medicine? Do not take this medicine with any of the following medications: -disulfiram -metronidazole This medicine may also interact with the following medications: -cyclosporine -diazepam -ketoconazole -medicines to increase blood counts like filgrastim, pegfilgrastim, sargramostim -other chemotherapy drugs like cisplatin, doxorubicin, epirubicin, etoposide, teniposide, vincristine -quinidine -testosterone -vaccines -verapamil Talk to your doctor or health care professional before taking any of these medicines: -acetaminophen -aspirin -ibuprofen -ketoprofen -naproxen This list may not describe all possible interactions. Give your health care provider a list of all the medicines, herbs, non-prescription drugs, or dietary supplements you use. Also tell them if you smoke, drink alcohol, or use illegal drugs. Some items may interact with your medicine. What should I watch for while using this medicine? Your condition will be monitored  carefully while you are receiving this medicine. You will need important blood work done while you are taking this medicine. This drug may make you feel generally unwell. This is not uncommon, as chemotherapy can affect healthy cells as well as cancer cells. Report any side effects. Continue your course of treatment even though you feel ill unless your doctor tells you to stop. In some cases, you may be given additional medicines to help with side effects. Follow all directions for their use. Call your doctor or health care professional for advice if you get a fever, chills or sore throat, or other symptoms of a cold or flu. Do not treat yourself. This drug decreases your body's ability to fight infections. Try to avoid being around people who are sick. This medicine may increase your risk to bruise or bleed. Call your doctor or health care professional if you notice any unusual bleeding. Be careful brushing and flossing your teeth or using a toothpick because you may get an infection or bleed more easily. If you have any dental work done, tell your dentist you are receiving this medicine. Avoid taking products that contain aspirin, acetaminophen, ibuprofen, naproxen, or ketoprofen unless instructed by your doctor. These medicines may hide a fever. Do not become pregnant while taking this medicine. Women should inform their doctor if they wish to become pregnant or think they might be pregnant. There is a potential for serious side effects to an unborn child. Talk to your health care professional or pharmacist for more information. Do not breast-feed an infant while taking this medicine. Men are  advised not to father a child while receiving this medicine. What side effects may I notice from receiving this medicine? Side effects that you should report to your doctor or health care professional as soon as possible: -allergic reactions like skin rash, itching or hives, swelling of the face, lips, or  tongue -low blood counts - This drug may decrease the number of white blood cells, red blood cells and platelets. You may be at increased risk for infections and bleeding. -signs of infection - fever or chills, cough, sore throat, pain or difficulty passing urine -signs of decreased platelets or bleeding - bruising, pinpoint red spots on the skin, black, tarry stools, nosebleeds -signs of decreased red blood cells - unusually weak or tired, fainting spells, lightheadedness -breathing problems -chest pain -high or low blood pressure -mouth sores -nausea and vomiting -pain, swelling, redness or irritation at the injection site -pain, tingling, numbness in the hands or feet -slow or irregular heartbeat -swelling of the ankle, feet, hands Side effects that usually do not require medical attention (report to your doctor or health care professional if they continue or are bothersome): -bone pain -complete hair loss including hair on your head, underarms, pubic hair, eyebrows, and eyelashes -changes in the color of fingernails -diarrhea -loosening of the fingernails -loss of appetite -muscle or joint pain -red flush to skin -sweating This list may not describe all possible side effects. Call your doctor for medical advice about side effects. You may report side effects to FDA at 1-800-FDA-1088. Where should I keep my medicine? This drug is given in a hospital or clinic and will not be stored at home. NOTE: This sheet is a summary. It may not cover all possible information. If you have questions about this medicine, talk to your doctor, pharmacist, or health care provider.  2015, [employer] Standard. (2012-06-10 16:41:21)

## 2014-06-08 ENCOUNTER — Telehealth: Payer: Self-pay | Admitting: *Deleted

## 2014-06-08 NOTE — Telephone Encounter (Signed)
Patient called to followup after first taxol. Patient states she did well over the weekend. She states "I felt slightly queasy but not bad enough to take anything for it." She states the bottom of her feet are a little bit sore but not tingling or burning. Per pt, she is very alert and attentive to any changes related to neuropathy as her husband had it for years. Told patient that Dr. Lindi Adie will be monitoring her very closely for any signs of neuropathy. Denies any other issues or concerns.  Confirmed next appts on 06/12/14. Told patient to please call [facility] between now and her next appt with any questions or concerns - patient agreeable to this.

## 2014-06-08 NOTE — Telephone Encounter (Signed)
-----   Message from Perlie Gold sent at 06/05/2014 12:15 PM EST ----- Regarding: Chemo Follow up call Contact: [phone] First time taxol. Dr. Lindi Adie patient. Please call.  Thanks,  Barnetta Chapel, RN

## 2014-06-12 ENCOUNTER — Telehealth: Payer: Self-pay | Admitting: Hematology and Oncology

## 2014-06-12 ENCOUNTER — Ambulatory Visit (HOSPITAL_BASED_OUTPATIENT_CLINIC_OR_DEPARTMENT_OTHER): Payer: Medicare Other | Admitting: Hematology and Oncology

## 2014-06-12 ENCOUNTER — Ambulatory Visit: Payer: Medicare Other

## 2014-06-12 ENCOUNTER — Telehealth: Payer: Self-pay | Admitting: *Deleted

## 2014-06-12 ENCOUNTER — Other Ambulatory Visit (HOSPITAL_BASED_OUTPATIENT_CLINIC_OR_DEPARTMENT_OTHER): Payer: Medicare Other

## 2014-06-12 DIAGNOSIS — C50411 Malignant neoplasm of upper-outer quadrant of right female breast: Secondary | ICD-10-CM | POA: Diagnosis not present

## 2014-06-12 DIAGNOSIS — M7989 Other specified soft tissue disorders: Secondary | ICD-10-CM | POA: Diagnosis not present

## 2014-06-12 DIAGNOSIS — D701 Agranulocytosis secondary to cancer chemotherapy: Secondary | ICD-10-CM | POA: Diagnosis not present

## 2014-06-12 DIAGNOSIS — G620 Drug-induced polyneuropathy: Secondary | ICD-10-CM | POA: Diagnosis not present

## 2014-06-12 DIAGNOSIS — Z171 Estrogen receptor negative status [ER-]: Secondary | ICD-10-CM | POA: Diagnosis not present

## 2014-06-12 LAB — CBC WITH DIFFERENTIAL/PLATELET
BASO%: 0.6 % (ref 0.0–2.0)
BASOS ABS: 0 10*3/uL (ref 0.0–0.1)
EOS%: 1.4 % (ref 0.0–7.0)
Eosinophils Absolute: 0.1 10*3/uL (ref 0.0–0.5)
HCT: 29.9 % — ABNORMAL LOW (ref 34.8–46.6)
HEMOGLOBIN: 10.1 g/dL — AB (ref 11.6–15.9)
LYMPH#: 1.3 10*3/uL (ref 0.9–3.3)
LYMPH%: 19.4 % (ref 14.0–49.7)
MCH: 30.1 pg (ref 25.1–34.0)
MCHC: 33.8 g/dL (ref 31.5–36.0)
MCV: 89.3 fL (ref 79.5–101.0)
MONO#: 0.8 10*3/uL (ref 0.1–0.9)
MONO%: 11.6 % (ref 0.0–14.0)
NEUT#: 4.5 10*3/uL (ref 1.5–6.5)
NEUT%: 67 % (ref 38.4–76.8)
Platelets: 336 10*3/uL (ref 145–400)
RBC: 3.35 10*6/uL — ABNORMAL LOW (ref 3.70–5.45)
RDW: 16.3 % — AB (ref 11.2–14.5)
WBC: 6.7 10*3/uL (ref 3.9–10.3)

## 2014-06-12 MED ORDER — FUROSEMIDE 20 MG PO TABS: 20.0000 mg | ORAL_TABLET | Freq: Every day | ORAL | Status: DC

## 2014-06-12 NOTE — Telephone Encounter (Signed)
, °

## 2014-06-12 NOTE — Telephone Encounter (Signed)
Per staff message and POF I have scheduled appts. Advised scheduler of appts. JMW  

## 2014-06-12 NOTE — Assessment & Plan Note (Signed)
Right breast invasive ductal carcinoma, palpable mass, 5 cm by mammogram and 5.7 cm by MRI grade 3, ER PR HER-2 negative, associated skin dimpling, T3, N0, M0 stage IIB Ki-67 90%  Current treatment: Neoadjuvant chemotherapy with dose dense Adriamycin and Cytoxan 04/10/2014. Treatment Plan is for 4 cycles Adriamycin and Cytoxanfollowed by weekly Taxol x1 changed to Abraxane 11 from 06/17/2014;   Chemotherapy-related toxicities: 1. Chemotherapy induced neutropenia grade 3: Chemotherapy dosage reduced for cycle 2 2. Nausea grade 1 3. Loss of taste 4. Decreased appetite  5. Bronchitis after cycle 2 treated with amoxicillin 6. Yeast infection: improved with Diflucan 7. Thrush in the throat: resolved with Diflucan 8. UTI: Currently on Cipro I will extended by 5 more days 9. Fatigue: Seem to be cumulatively getting worse 10. Neuropathy in her feet grade 2 with just 1 dose of Taxol: Hence we are changing treatment to Abraxane which was the rescheduled for 17th of February. We are holding chemotherapy today.  Cardiomyopathy: S/P MRI heart. Stable. Patient received dexrazoxane During chemotherapy with Adriamycin.  Monitoring closely for toxicities.

## 2014-06-12 NOTE — Progress Notes (Signed)
Patient Care Team: Laurey Morale, MD as PCP - General Excell Seltzer, MD as Consulting Physician (General Surgery) Rulon Eisenmenger, MD as Consulting Physician (Hematology and Oncology) Rexene Edison, MD as Consulting Physician (Radiation Oncology) Trinda Pascal, NP as Nurse Practitioner (Nurse Practitioner)  DIAGNOSIS: Breast cancer of upper-outer quadrant of right female breast   Staging form: Breast, AJCC 7th Edition     Clinical stage from 04/01/2014: Stage IIB (T3, N0, M0) - Unsigned       Staging comments: Staged at breast conference on 12.2.15    SUMMARY OF ONCOLOGIC HISTORY:   Breast cancer of upper-outer quadrant of right female breast   03/18/2014 Mammogram Right breast suspicious mass 10:00 4.9 x 3 x 5 cm, 2 lower right axillary lymph nodes mildly thickened cortices   03/23/2014 Initial Biopsy Right breast needle biopsy 9:00: Invasive ductal carcinoma grade 3, ER PR HER-2 negative ratio 1.3   03/31/2014 Breast MRI Right breast mass 9:00 upper outer quadrant with contiguous skin involvement by 5.7 cm, right axillary lymph nodes normal in size   04/10/2014 -  Neo-Adjuvant Chemotherapy Dose dense Adriamycin and Cytoxan 4 followed by weekly Taxol 12 after week 1 Taxol changed to Abraxane    CHIEF COMPLIANT: Taxol being held  INTERVAL HISTORY: Stephanie Olsen is a 61 year old with above-mentioned history of right-sided breast cancer was received first cycle of Taxol last week and is here for toxicity check. She reported that since that last weeks Taxol, she has noticed increased leg swelling pain and discomfort especially worse in the left leg compared to the right leg. She cannot feel the bottom of her foot on the balls of the toes as well as pain.  REVIEW OF SYSTEMS:   Constitutional: Denies fevers, chills or abnormal weight loss Eyes: Denies blurriness of vision Ears, nose, mouth, throat, and face: Denies mucositis or sore throat Respiratory: Denies cough,  dyspnea or wheezes Cardiovascular: Denies palpitation, chest discomfort 1+lower extremity swelling Gastrointestinal:  Denies nausea, heartburn or change in bowel habits Skin: Denies abnormal skin rashes Lymphatics: Denies new lymphadenopathy or easy bruising Neurological: Pain numbness and swelling of the legs Behavioral/Psych: Mood is stable, no new changes   All other systems were reviewed with the patient and are negative.  I have reviewed the past medical history, past surgical history, social history and family history with the patient and they are unchanged from previous note.  ALLERGIES:  has No Known Allergies.  MEDICATIONS:  Current Outpatient Prescriptions  Medication Sig Dispense Refill  . Ascorbic Acid (VITAMIN C) 500 MG tablet Take 500 mg by mouth 2 (two) times daily.     Marland Kitchen aspirin 81 MG tablet Take 81 mg by mouth every morning.     . beta carotene w/minerals (OCUVITE) tablet Take 1 tablet by mouth every morning.     . Calcium Carb-Cholecalciferol (CALCIUM 600/VITAMIN D3) 600-800 MG-UNIT TABS Take 1 tablet by mouth every morning.    . carvedilol (COREG) 3.125 MG tablet Take 1 tablet (3.125 mg total) by mouth 2 (two) times daily. 180 tablet 3  . cetirizine (ZYRTEC) 10 MG tablet Take 10 mg by mouth every morning.     . clonazePAM (KLONOPIN) 2 MG tablet Take 1 tablet (2 mg total) by mouth 2 (two) times daily as needed for anxiety. (Patient taking differently: Take 1 mg by mouth 2 (two) times daily as needed for anxiety. ) 180 tablet 1  . cyclobenzaprine (FLEXERIL) 10 MG tablet Take 1 tablet (10 mg total)  by mouth 3 (three) times daily as needed for muscle spasms. For spasms 90 tablet 5  . fish oil-omega-3 fatty acids 1000 MG capsule Take 1 g by mouth every morning.     . fluticasone (FLONASE) 50 MCG/ACT nasal spray INSTILL 2 SPRAYS INTO THE NOSE DAILY 48 g 1  . HYDROcodone-acetaminophen (NORCO/VICODIN) 5-325 MG per tablet Take 1-2 tablets by mouth every 4 (four) hours as needed  for moderate pain or severe pain. 25 tablet 0  . ibuprofen (ADVIL,MOTRIN) 200 MG tablet Take 200 mg by mouth every 6 (six) hours as needed for headache or mild pain.    Marland Kitchen ketorolac (ACULAR) 0.5 % ophthalmic solution   0  . latanoprost (XALATAN) 0.005 % ophthalmic solution Place 1 drop into both eyes at bedtime.    . Magnesium 500 MG TABS Take 1 tablet by mouth at bedtime.    . metoCLOPramide (REGLAN) 10 MG tablet   5  . metroNIDAZOLE (METROGEL) 0.75 % gel Apply 1 application topically 2 (two) times daily as needed (rosacea on face.).     Marland Kitchen nystatin (MYCOSTATIN) 100000 UNIT/ML suspension Take 5 mLs (500,000 Units total) by mouth 4 (four) times daily. 60 mL 0  . timolol (BETIMOL) 0.5 % ophthalmic solution Place 1 drop into both eyes every morning.    Marland Kitchen UNABLE TO FIND Apply 1 each topically as needed. 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 1 each 1  . furosemide (LASIX) 20 MG tablet Take 1 tablet (20 mg total) by mouth daily. 14 tablet 0  . lisinopril (PRINIVIL,ZESTRIL) 10 MG tablet Take 1 tablet (10 mg total) by mouth daily. (Patient not taking: Reported on 06/12/2014) 90 tablet 3   No current facility-administered medications for this visit.    PHYSICAL EXAMINATION: ECOG PERFORMANCE STATUS: 1 - Symptomatic but completely ambulatory  Filed Vitals:   06/12/14 1130  BP: 131/48  Pulse: 98  Temp: 98.3 F (36.8 C)  Resp: 18   Filed Weights   06/12/14 1130  Weight: 191 lb 1.6 oz (86.682 kg)    GENERAL:alert, no distress and comfortable SKIN: skin color, texture, turgor are normal, no rashes or significant lesions EYES: normal, Conjunctiva are pink and non-injected, sclera clear OROPHARYNX:no exudate, no erythema and lips, buccal mucosa, and tongue normal  NECK: supple, thyroid normal size, non-tender, without nodularity LYMPH:  no palpable lymphadenopathy in the cervical, axillary or inguinal LUNGS: clear to auscultation and percussion  with normal breathing effort HEART: regular rate & rhythm and no murmurs and no lower extremity edema ABDOMEN:abdomen soft, non-tender and normal bowel sounds Musculoskeletal:no cyanosis of digits and no clubbing  NEURO: alert & oriented x 3 with fluent speech, no focal motor/sensory deficits LABORATORY DATA:  I have reviewed the data as listed   Chemistry      Component Value Date/Time   NA 137 06/05/2014 0854   NA 134* 02/12/2014 1108   K 3.9 06/05/2014 0854   K 4.7 02/12/2014 1108   CL 100 02/12/2014 1108   CO2 22 06/05/2014 0854   CO2 25 02/12/2014 1108   BUN 8.1 06/05/2014 0854   BUN 10 02/12/2014 1108   CREATININE 0.7 06/05/2014 0854   CREATININE 0.8 02/12/2014 1108      Component Value Date/Time   CALCIUM 9.2 06/05/2014 0854   CALCIUM 9.5 02/12/2014 1108   ALKPHOS 106 06/05/2014 0854   ALKPHOS 71 02/12/2014 1108   AST 17 06/05/2014 0854   AST 22 02/12/2014 1108   ALT  18 06/05/2014 0854   ALT 22 02/12/2014 1108   BILITOT 0.29 06/05/2014 0854   BILITOT 0.5 02/12/2014 1108       Lab Results  Component Value Date   WBC 6.7 06/12/2014   HGB 10.1* 06/12/2014   HCT 29.9* 06/12/2014   MCV 89.3 06/12/2014   PLT 336 06/12/2014   NEUTROABS 4.5 06/12/2014    ASSESSMENT & PLAN:  Breast cancer of upper-outer quadrant of right female breast Right breast invasive ductal carcinoma, palpable mass, 5 cm by mammogram and 5.7 cm by MRI grade 3, ER PR HER-2 negative, associated skin dimpling, T3, N0, M0 stage IIB Ki-67 90%  Current treatment: Neoadjuvant chemotherapy with dose dense Adriamycin and Cytoxan 04/10/2014. Treatment Plan is for 4 cycles Adriamycin and Cytoxanfollowed by weekly Taxol x1 changed to Abraxane 11 from 06/17/2014;   Chemotherapy-related toxicities: 1. Chemotherapy induced neutropenia grade 3: Chemotherapy dosage reduced for cycle 2 2. Nausea grade 1 3. Loss of taste 4. Decreased appetite  5. Bronchitis after cycle 2 treated with amoxicillin 6.  Yeast infection: improved with Diflucan 7. Thrush in the throat: resolved with Diflucan 8. UTI: Currently on Cipro I will extended by 5 more days 9. Fatigue: Seem to be cumulatively getting worse 10. Neuropathy in her feet grade 2 with just 1 dose of Taxol: Hence we are changing treatment to Abraxane which was the rescheduled for 17th of February. We are holding chemotherapy today. 11. Leg swelling: Prescribed Lasix to 22,016   Cardiomyopathy: S/P MRI heart. Stable. Patient received dexrazoxaneduring chemotherapy with Adriamycin.She will need an echocardiogram at some time in the future  Monitoring closely for toxicities.    No orders of the defined types were placed in this encounter.   The patient has a good understanding of the overall plan. she agrees with it. She will call with any problems that may develop before her next visit here.   Rulon Eisenmenger, MD

## 2014-06-16 ENCOUNTER — Other Ambulatory Visit: Payer: Self-pay

## 2014-06-16 DIAGNOSIS — C50411 Malignant neoplasm of upper-outer quadrant of right female breast: Secondary | ICD-10-CM

## 2014-06-16 NOTE — Progress Notes (Signed)
RE: Regimen change  Received: Today    [place], RN; Loreta Ave A White; Stanford Breed; Ebony Zannie [place] Cc: Gaspar Bidding           Elvia Collum,  She has medicare. So she is good.  Thanks,  Raquel       Previous Messages     ----- Message -----   From: Prentiss Bells, RN   Sent: 06/15/2014  3:40 PM    To: Stanford Breed, Lenise A [place], [place], *  Subject: Regimen change                  FYI - He changed her regimen to weekly abraxane starting 2/17. Thanks Favor Kreh

## 2014-06-17 ENCOUNTER — Other Ambulatory Visit (HOSPITAL_BASED_OUTPATIENT_CLINIC_OR_DEPARTMENT_OTHER): Payer: Medicare Other

## 2014-06-17 ENCOUNTER — Ambulatory Visit (HOSPITAL_BASED_OUTPATIENT_CLINIC_OR_DEPARTMENT_OTHER): Payer: Medicare Other

## 2014-06-17 DIAGNOSIS — C50811 Malignant neoplasm of overlapping sites of right female breast: Secondary | ICD-10-CM

## 2014-06-17 DIAGNOSIS — Z5111 Encounter for antineoplastic chemotherapy: Secondary | ICD-10-CM | POA: Diagnosis not present

## 2014-06-17 DIAGNOSIS — C50411 Malignant neoplasm of upper-outer quadrant of right female breast: Secondary | ICD-10-CM

## 2014-06-17 LAB — CBC WITH DIFFERENTIAL/PLATELET
BASO%: 0.7 % (ref 0.0–2.0)
Basophils Absolute: 0 10*3/uL (ref 0.0–0.1)
EOS ABS: 0.1 10*3/uL (ref 0.0–0.5)
EOS%: 1.7 % (ref 0.0–7.0)
HEMATOCRIT: 32 % — AB (ref 34.8–46.6)
HGB: 10.8 g/dL — ABNORMAL LOW (ref 11.6–15.9)
LYMPH%: 23.9 % (ref 14.0–49.7)
MCH: 30.2 pg (ref 25.1–34.0)
MCHC: 33.8 g/dL (ref 31.5–36.0)
MCV: 89.4 fL (ref 79.5–101.0)
MONO#: 1 10*3/uL — AB (ref 0.1–0.9)
MONO%: 17.4 % — ABNORMAL HIGH (ref 0.0–14.0)
NEUT%: 56.3 % (ref 38.4–76.8)
NEUTROS ABS: 3.3 10*3/uL (ref 1.5–6.5)
Platelets: 272 10*3/uL (ref 145–400)
RBC: 3.58 10*6/uL — ABNORMAL LOW (ref 3.70–5.45)
RDW: 16.8 % — ABNORMAL HIGH (ref 11.2–14.5)
WBC: 5.9 10*3/uL (ref 3.9–10.3)
lymph#: 1.4 10*3/uL (ref 0.9–3.3)

## 2014-06-17 LAB — COMPREHENSIVE METABOLIC PANEL (CC13)
ALT: 16 U/L (ref 0–55)
ANION GAP: 9 meq/L (ref 3–11)
AST: 18 U/L (ref 5–34)
Albumin: 3.3 g/dL — ABNORMAL LOW (ref 3.5–5.0)
Alkaline Phosphatase: 74 U/L (ref 40–150)
BUN: 9.1 mg/dL (ref 7.0–26.0)
CALCIUM: 9 mg/dL (ref 8.4–10.4)
CO2: 25 meq/L (ref 22–29)
CREATININE: 0.9 mg/dL (ref 0.6–1.1)
Chloride: 107 mEq/L (ref 98–109)
EGFR: 70 mL/min/{1.73_m2} — ABNORMAL LOW (ref >90)
Glucose: 107 mg/dl (ref 70–140)
Potassium: 3.6 mEq/L (ref 3.5–5.1)
Sodium: 142 mEq/L (ref 136–145)
Total Bilirubin: 0.49 mg/dL (ref 0.20–1.20)
Total Protein: 6.2 g/dL — ABNORMAL LOW (ref 6.4–8.3)

## 2014-06-17 MED ORDER — HEPARIN SOD (PORK) LOCK FLUSH 100 UNIT/ML IV SOLN
500.0000 [IU] | Freq: Once | INTRAVENOUS | Status: AC | PRN
  Administered 2014-06-17: 500 [IU]
  Filled 2014-06-17: qty 5

## 2014-06-17 MED ORDER — SODIUM CHLORIDE 0.9 % IV SOLN
Freq: Once | INTRAVENOUS | Status: AC
  Administered 2014-06-17: 15:00:00 via INTRAVENOUS

## 2014-06-17 MED ORDER — DEXAMETHASONE SODIUM PHOSPHATE 10 MG/ML IJ SOLN
10.0000 mg | Freq: Once | INTRAMUSCULAR | Status: AC
  Administered 2014-06-17: 10 mg via INTRAVENOUS

## 2014-06-17 MED ORDER — PACLITAXEL PROTEIN-BOUND CHEMO INJECTION 100 MG
80.0000 mg/m2 | Freq: Once | INTRAVENOUS | Status: AC
  Administered 2014-06-17: 150 mg via INTRAVENOUS
  Filled 2014-06-17: qty 30

## 2014-06-17 MED ORDER — ONDANSETRON 8 MG/NS 50 ML IVPB
INTRAVENOUS | Status: AC
  Filled 2014-06-17: qty 8

## 2014-06-17 MED ORDER — SODIUM CHLORIDE 0.9 % IJ SOLN
10.0000 mL | INTRAMUSCULAR | Status: DC | PRN
  Administered 2014-06-17: 10 mL
  Filled 2014-06-17: qty 10

## 2014-06-17 MED ORDER — ONDANSETRON 8 MG/50ML IVPB (CHCC)
8.0000 mg | Freq: Once | INTRAVENOUS | Status: AC
  Administered 2014-06-17: 8 mg via INTRAVENOUS

## 2014-06-17 MED ORDER — DEXAMETHASONE SODIUM PHOSPHATE 10 MG/ML IJ SOLN
INTRAMUSCULAR | Status: AC
  Filled 2014-06-17: qty 1

## 2014-06-17 NOTE — Progress Notes (Unsigned)
Mild erythema noted at implanted port incision prior to port access. Pt stated area was not red prior to applying EMLA. Pt instructed to call office if erythema worsens or if she develops pain at site. She voiced understanding.

## 2014-06-17 NOTE — Patient Instructions (Signed)
[facility] Discharge Instructions for Patients Receiving Chemotherapy  Today you received the following chemotherapy agents: Abraxane.  To help prevent nausea and vomiting after your treatment, we encourage you to take your nausea medication: Zofran. Take one every eight hours as needed.   If you develop nausea and vomiting that is not controlled by your nausea medication, call the clinic.   BELOW ARE SYMPTOMS THAT SHOULD BE REPORTED IMMEDIATELY:  *FEVER GREATER THAN 100.5 F  *CHILLS WITH OR WITHOUT FEVER  NAUSEA AND VOMITING THAT IS NOT CONTROLLED WITH YOUR NAUSEA MEDICATION  *UNUSUAL SHORTNESS OF BREATH  *UNUSUAL BRUISING OR BLEEDING  TENDERNESS IN MOUTH AND THROAT WITH OR WITHOUT PRESENCE OF ULCERS  *URINARY PROBLEMS  *BOWEL PROBLEMS  UNUSUAL RASH Items with * indicate a potential emergency and should be followed up as soon as possible.  Feel free to call the clinic should you have any questions or concerns. The clinic phone number is [phone].

## 2014-06-18 ENCOUNTER — Telehealth: Payer: Self-pay

## 2014-06-18 NOTE — Telephone Encounter (Signed)
-----   Message from Ludwig Lean, RN sent at 06/17/2014  3:26 PM EST ----- Regarding: Chemo Follow Up Call Dr. Lindi Adie. First Abraxane.

## 2014-06-18 NOTE — Telephone Encounter (Signed)
lvm we are f/u on new chemo agent from yesterday. Checking on S.E. such as nausea, diarrhea or constipation, neuropathy. Call if any questions or concerns.

## 2014-06-19 ENCOUNTER — Ambulatory Visit: Payer: Medicare Other

## 2014-06-23 ENCOUNTER — Other Ambulatory Visit: Payer: Self-pay

## 2014-06-23 DIAGNOSIS — C50411 Malignant neoplasm of upper-outer quadrant of right female breast: Secondary | ICD-10-CM

## 2014-06-24 ENCOUNTER — Ambulatory Visit (HOSPITAL_BASED_OUTPATIENT_CLINIC_OR_DEPARTMENT_OTHER): Payer: Medicare Other | Admitting: Hematology and Oncology

## 2014-06-24 ENCOUNTER — Other Ambulatory Visit (HOSPITAL_BASED_OUTPATIENT_CLINIC_OR_DEPARTMENT_OTHER): Payer: Medicare Other

## 2014-06-24 ENCOUNTER — Ambulatory Visit (HOSPITAL_BASED_OUTPATIENT_CLINIC_OR_DEPARTMENT_OTHER): Payer: Medicare Other

## 2014-06-24 ENCOUNTER — Telehealth: Payer: Self-pay | Admitting: Hematology and Oncology

## 2014-06-24 VITALS — BP 135/72 | HR 79 | Temp 98.9°F | Resp 18 | Ht 66.0 in | Wt 190.6 lb

## 2014-06-24 DIAGNOSIS — I429 Cardiomyopathy, unspecified: Secondary | ICD-10-CM

## 2014-06-24 DIAGNOSIS — D702 Other drug-induced agranulocytosis: Secondary | ICD-10-CM | POA: Diagnosis not present

## 2014-06-24 DIAGNOSIS — N390 Urinary tract infection, site not specified: Secondary | ICD-10-CM

## 2014-06-24 DIAGNOSIS — M7989 Other specified soft tissue disorders: Secondary | ICD-10-CM

## 2014-06-24 DIAGNOSIS — G620 Drug-induced polyneuropathy: Secondary | ICD-10-CM

## 2014-06-24 DIAGNOSIS — B379 Candidiasis, unspecified: Secondary | ICD-10-CM

## 2014-06-24 DIAGNOSIS — C50811 Malignant neoplasm of overlapping sites of right female breast: Secondary | ICD-10-CM

## 2014-06-24 DIAGNOSIS — R110 Nausea: Secondary | ICD-10-CM | POA: Diagnosis not present

## 2014-06-24 DIAGNOSIS — C50411 Malignant neoplasm of upper-outer quadrant of right female breast: Secondary | ICD-10-CM

## 2014-06-24 DIAGNOSIS — R439 Unspecified disturbances of smell and taste: Secondary | ICD-10-CM | POA: Diagnosis not present

## 2014-06-24 DIAGNOSIS — Z5111 Encounter for antineoplastic chemotherapy: Secondary | ICD-10-CM

## 2014-06-24 LAB — COMPREHENSIVE METABOLIC PANEL (CC13)
ALT: 19 U/L (ref 0–55)
AST: 19 U/L (ref 5–34)
Albumin: 3.4 g/dL — ABNORMAL LOW (ref 3.5–5.0)
Alkaline Phosphatase: 76 U/L (ref 40–150)
Anion Gap: 9 mEq/L (ref 3–11)
BUN: 9.7 mg/dL (ref 7.0–26.0)
CO2: 26 meq/L (ref 22–29)
Calcium: 9.4 mg/dL (ref 8.4–10.4)
Chloride: 102 mEq/L (ref 98–109)
Creatinine: 0.7 mg/dL (ref 0.6–1.1)
GLUCOSE: 103 mg/dL (ref 70–140)
Potassium: 3.6 mEq/L (ref 3.5–5.1)
SODIUM: 137 meq/L (ref 136–145)
Total Bilirubin: 0.59 mg/dL (ref 0.20–1.20)
Total Protein: 6.6 g/dL (ref 6.4–8.3)

## 2014-06-24 LAB — CBC WITH DIFFERENTIAL/PLATELET
BASO%: 0.5 % (ref 0.0–2.0)
BASOS ABS: 0 10*3/uL (ref 0.0–0.1)
EOS ABS: 0.3 10*3/uL (ref 0.0–0.5)
EOS%: 5.1 % (ref 0.0–7.0)
HCT: 33.2 % — ABNORMAL LOW (ref 34.8–46.6)
HEMOGLOBIN: 11.1 g/dL — AB (ref 11.6–15.9)
LYMPH%: 24.6 % (ref 14.0–49.7)
MCH: 29.8 pg (ref 25.1–34.0)
MCHC: 33.4 g/dL (ref 31.5–36.0)
MCV: 89.2 fL (ref 79.5–101.0)
MONO#: 0.5 10*3/uL (ref 0.1–0.9)
MONO%: 9.1 % (ref 0.0–14.0)
NEUT%: 60.7 % (ref 38.4–76.8)
NEUTROS ABS: 3.6 10*3/uL (ref 1.5–6.5)
PLATELETS: 359 10*3/uL (ref 145–400)
RBC: 3.72 10*6/uL (ref 3.70–5.45)
RDW: 16 % — ABNORMAL HIGH (ref 11.2–14.5)
WBC: 5.9 10*3/uL (ref 3.9–10.3)
lymph#: 1.5 10*3/uL (ref 0.9–3.3)
nRBC: 0 % (ref 0–0)

## 2014-06-24 MED ORDER — SODIUM CHLORIDE 0.9 % IJ SOLN
10.0000 mL | INTRAMUSCULAR | Status: DC | PRN
  Administered 2014-06-24: 10 mL
  Filled 2014-06-24: qty 10

## 2014-06-24 MED ORDER — SODIUM CHLORIDE 0.9 % IV SOLN
Freq: Once | INTRAVENOUS | Status: AC
  Administered 2014-06-24: 16:00:00 via INTRAVENOUS

## 2014-06-24 MED ORDER — ONDANSETRON 8 MG/50ML IVPB (CHCC)
8.0000 mg | Freq: Once | INTRAVENOUS | Status: AC
  Administered 2014-06-24: 8 mg via INTRAVENOUS

## 2014-06-24 MED ORDER — PACLITAXEL PROTEIN-BOUND CHEMO INJECTION 100 MG
80.0000 mg/m2 | Freq: Once | INTRAVENOUS | Status: AC
  Administered 2014-06-24: 150 mg via INTRAVENOUS
  Filled 2014-06-24: qty 30

## 2014-06-24 MED ORDER — ONDANSETRON 8 MG/NS 50 ML IVPB
INTRAVENOUS | Status: AC
  Filled 2014-06-24: qty 8

## 2014-06-24 MED ORDER — FUROSEMIDE 20 MG PO TABS: 20.0000 mg | ORAL_TABLET | Freq: Every day | ORAL | Status: DC

## 2014-06-24 MED ORDER — HEPARIN SOD (PORK) LOCK FLUSH 100 UNIT/ML IV SOLN
500.0000 [IU] | Freq: Once | INTRAVENOUS | Status: AC | PRN
  Administered 2014-06-24: 500 [IU]
  Filled 2014-06-24: qty 5

## 2014-06-24 NOTE — Progress Notes (Signed)
Patient Care Team: Laurey Morale, MD as PCP - General Excell Seltzer, MD as Consulting Physician (General Surgery) Rulon Eisenmenger, MD as Consulting Physician (Hematology and Oncology) Rexene Edison, MD as Consulting Physician (Radiation Oncology) Trinda Pascal, NP as Nurse Practitioner (Nurse Practitioner)  DIAGNOSIS: Breast cancer of upper-outer quadrant of right female breast   Staging form: Breast, AJCC 7th Edition     Clinical stage from 04/01/2014: Stage IIB (T3, N0, M0) - Unsigned       Staging comments: Staged at breast conference on 12.2.15    SUMMARY OF ONCOLOGIC HISTORY:   Breast cancer of upper-outer quadrant of right female breast   03/18/2014 Mammogram Right breast suspicious mass 10:00 4.9 x 3 x 5 cm, 2 lower right axillary lymph nodes mildly thickened cortices   03/23/2014 Initial Biopsy Right breast needle biopsy 9:00: Invasive ductal carcinoma grade 3, ER PR HER-2 negative ratio 1.3   03/31/2014 Breast MRI Right breast mass 9:00 upper outer quadrant with contiguous skin involvement by 5.7 cm, right axillary lymph nodes normal in size   04/10/2014 -  Neo-Adjuvant Chemotherapy Dose dense Adriamycin and Cytoxan 4 followed by weekly Taxol 12 after week 1 Taxol changed to Abraxane    CHIEF COMPLIANT: Abraxane week 2  INTERVAL HISTORY: Stephanie Olsen is a 69 year old lady with above-mentioned history of right-sided breast cancer currently being treated with neoadjuvant chemotherapy. She completed dose dense Adriamycin and Cytoxan and after first dose of Taxol she had profound leg pain and neuropathy. We switched her treatment Abraxane in her leg pain has completely resolved. She does not have any neuropathy symptoms either. Her leg swelling is much improved with Lasix but she continues to have still small amount of swelling and would like to continue with the Lasix for the time being. She needs a new prescription today.  REVIEW OF SYSTEMS:   Constitutional:  Denies fevers, chills or abnormal weight loss Eyes: Denies blurriness of vision Ears, nose, mouth, throat, and face: Denies mucositis or sore throat Respiratory: Denies cough, dyspnea or wheezes Cardiovascular: Denies palpitation, chest discomfort or lower extremity swelling Gastrointestinal:  Denies nausea, heartburn or change in bowel habits Skin: Denies abnormal skin rashes Lymphatics: Denies new lymphadenopathy or easy bruising Neurological:leg pain and numbness has resolved Behavioral/Psych: Mood is stable, no new changes  All other systems were reviewed with the patient and are negative.  I have reviewed the past medical history, past surgical history, social history and family history with the patient and they are unchanged from previous note.  ALLERGIES:  has No Known Allergies.  MEDICATIONS:  Current Outpatient Prescriptions  Medication Sig Dispense Refill  . Ascorbic Acid (VITAMIN C) 500 MG tablet Take 500 mg by mouth 2 (two) times daily.     Marland Kitchen aspirin 81 MG tablet Take 81 mg by mouth every morning.     . beta carotene w/minerals (OCUVITE) tablet Take 1 tablet by mouth every morning.     . Calcium Carb-Cholecalciferol (CALCIUM 600/VITAMIN D3) 600-800 MG-UNIT TABS Take 1 tablet by mouth every morning.    . carvedilol (COREG) 3.125 MG tablet Take 1 tablet (3.125 mg total) by mouth 2 (two) times daily. 180 tablet 3  . cetirizine (ZYRTEC) 10 MG tablet Take 10 mg by mouth every morning.     . clonazePAM (KLONOPIN) 2 MG tablet Take 1 tablet (2 mg total) by mouth 2 (two) times daily as needed for anxiety. (Patient taking differently: Take 1 mg by mouth 2 (two) times daily  as needed for anxiety. ) 180 tablet 1  . cyclobenzaprine (FLEXERIL) 10 MG tablet Take 1 tablet (10 mg total) by mouth 3 (three) times daily as needed for muscle spasms. For spasms 90 tablet 5  . fish oil-omega-3 fatty acids 1000 MG capsule Take 1 g by mouth every morning.     . fluticasone (FLONASE) 50 MCG/ACT nasal  spray INSTILL 2 SPRAYS INTO THE NOSE DAILY 48 g 1  . furosemide (LASIX) 20 MG tablet Take 1 tablet (20 mg total) by mouth daily. 30 tablet 1  . HYDROcodone-acetaminophen (NORCO/VICODIN) 5-325 MG per tablet Take 1-2 tablets by mouth every 4 (four) hours as needed for moderate pain or severe pain. 25 tablet 0  . ibuprofen (ADVIL,MOTRIN) 200 MG tablet Take 200 mg by mouth every 6 (six) hours as needed for headache or mild pain.    Marland Kitchen ketorolac (ACULAR) 0.5 % ophthalmic solution   0  . latanoprost (XALATAN) 0.005 % ophthalmic solution Place 1 drop into both eyes at bedtime.    . Magnesium 500 MG TABS Take 1 tablet by mouth at bedtime.    . metoCLOPramide (REGLAN) 10 MG tablet   5  . metroNIDAZOLE (METROGEL) 0.75 % gel Apply 1 application topically 2 (two) times daily as needed (rosacea on face.).     Marland Kitchen nystatin (MYCOSTATIN) 100000 UNIT/ML suspension Take 5 mLs (500,000 Units total) by mouth 4 (four) times daily. 60 mL 0  . timolol (BETIMOL) 0.5 % ophthalmic solution Place 1 drop into both eyes every morning.    Marland Kitchen UNABLE TO FIND Apply 1 each topically as needed. 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 1 each 1  . lisinopril (PRINIVIL,ZESTRIL) 10 MG tablet Take 1 tablet (10 mg total) by mouth daily. (Patient not taking: Reported on 06/12/2014) 90 tablet 3   No current facility-administered medications for this visit.   Facility-Administered Medications Ordered in Other Visits  Medication Dose Route Frequency Provider Last Rate Last Dose  . heparin lock flush 100 unit/mL  500 Units Intracatheter Once PRN Rulon Eisenmenger, MD      . PACLitaxel-protein bound (ABRAXANE) chemo infusion 150 mg  80 mg/m2 (Treatment Plan Actual) Intravenous Once Rulon Eisenmenger, MD 60 mL/hr at 06/24/14 1635 150 mg at 06/24/14 1635  . sodium chloride 0.9 % injection 10 mL  10 mL Intracatheter PRN Rulon Eisenmenger, MD   10 mL at 06/17/14 1635  . sodium chloride 0.9 % injection 10 mL  10  mL Intracatheter PRN Rulon Eisenmenger, MD        PHYSICAL EXAMINATION: ECOG PERFORMANCE STATUS: 1 - Symptomatic but completely ambulatory  Filed Vitals:   06/24/14 1446  BP: 135/72  Pulse: 79  Temp: 98.9 F (37.2 C)  Resp: 18   Filed Weights   06/24/14 1446  Weight: 190 lb 9.6 oz (86.456 kg)    GENERAL:alert, no distress and comfortable SKIN: skin color, texture, turgor are normal, no rashes or significant lesions EYES: normal, Conjunctiva are pink and non-injected, sclera clear OROPHARYNX:no exudate, no erythema and lips, buccal mucosa, and tongue normal  NECK: supple, thyroid normal size, non-tender, without nodularity LYMPH:  no palpable lymphadenopathy in the cervical, axillary or inguinal LUNGS: clear to auscultation and percussion with normal breathing effort HEART: regular rate & rhythm and no murmurs and no lower extremity edema ABDOMEN:abdomen soft, non-tender and normal bowel sounds Musculoskeletal:no cyanosis of digits and no clubbing  NEURO: alert & oriented x 3 with fluent speech,  no focal motor/sensory deficits  LABORATORY DATA:  I have reviewed the data as listed   Chemistry      Component Value Date/Time   NA 137 06/24/2014 1437   NA 134* 02/12/2014 1108   K 3.6 06/24/2014 1437   K 4.7 02/12/2014 1108   CL 100 02/12/2014 1108   CO2 26 06/24/2014 1437   CO2 25 02/12/2014 1108   BUN 9.7 06/24/2014 1437   BUN 10 02/12/2014 1108   CREATININE 0.7 06/24/2014 1437   CREATININE 0.8 02/12/2014 1108      Component Value Date/Time   CALCIUM 9.4 06/24/2014 1437   CALCIUM 9.5 02/12/2014 1108   ALKPHOS 76 06/24/2014 1437   ALKPHOS 71 02/12/2014 1108   AST 19 06/24/2014 1437   AST 22 02/12/2014 1108   ALT 19 06/24/2014 1437   ALT 22 02/12/2014 1108   BILITOT 0.59 06/24/2014 1437   BILITOT 0.5 02/12/2014 1108       Lab Results  Component Value Date   WBC 5.9 06/24/2014   HGB 11.1* 06/24/2014   HCT 33.2* 06/24/2014   MCV 89.2 06/24/2014   PLT 359  06/24/2014   NEUTROABS 3.6 06/24/2014    ASSESSMENT & PLAN:  Breast cancer of upper-outer quadrant of right female breast Right breast invasive ductal carcinoma, palpable mass, 5 cm by mammogram and 5.7 cm by MRI grade 3, ER PR HER-2 negative, associated skin dimpling, T3, N0, M0 stage IIB Ki-67 90%  Current treatment: Neoadjuvant chemotherapy with dose dense Adriamycin and Cytoxan 04/10/2014. Treatment Plan is for 4 cycles Adriamycin and Cytoxanfollowed by weekly Taxol x1 changed to Abraxane 11 from 06/17/2014;  Today is week 2 of Abraxane   Chemotherapy-related toxicities: 1. Chemotherapy induced neutropenia grade 3: Chemotherapy dosage reduced for cycle 2 2. Nausea grade 1 3. Loss of taste 4. Decreased appetite  5. Bronchitis after cycle 2 treated with amoxicillin 6. Yeast infection: improved with Diflucan 7. Thrush in the throat: resolved with Diflucan 8. UTI: Currently on Cipro I will extended by 5 more days 9. Fatigue: Seem to be cumulatively getting worse 10. Neuropathy in her feet grade 2 with just 1 dose of Taxol: Hence we  changed treatment to Abraxane.  On Abraxane symptoms have improved. 11. Leg swelling: Prescribed Lasix  Which led to improvement in leg swelling.  Cardiomyopathy: S/P MRI heart. Stable. Patient received dexrazoxaneduring chemotherapy with Adriamycin.She will need an echocardiogram at some time in the future  Monitoring closely for toxicities.   return to clinic in 2 weeks for follow-up    No orders of the defined types were placed in this encounter.   The patient has a good understanding of the overall plan. she agrees with it. She will call with any problems that may develop before her next visit here.   Rulon Eisenmenger, MD

## 2014-06-24 NOTE — Assessment & Plan Note (Signed)
Right breast invasive ductal carcinoma, palpable mass, 5 cm by mammogram and 5.7 cm by MRI grade 3, ER PR HER-2 negative, associated skin dimpling, T3, N0, M0 stage IIB Ki-67 90%  Current treatment: Neoadjuvant chemotherapy with dose dense Adriamycin and Cytoxan 04/10/2014. Treatment Plan is for 4 cycles Adriamycin and Cytoxanfollowed by weekly Taxol x1 changed to Abraxane 11 from 06/17/2014;  Today is week 2 of Abraxane   Chemotherapy-related toxicities: 1. Chemotherapy induced neutropenia grade 3: Chemotherapy dosage reduced for cycle 2 2. Nausea grade 1 3. Loss of taste 4. Decreased appetite  5. Bronchitis after cycle 2 treated with amoxicillin 6. Yeast infection: improved with Diflucan 7. Thrush in the throat: resolved with Diflucan 8. UTI: Currently on Cipro I will extended by 5 more days 9. Fatigue: Seem to be cumulatively getting worse 10. Neuropathy in her feet grade 2 with just 1 dose of Taxol: Hence we  changed treatment to Abraxane.  On Abraxane symptoms have improved. 11. Leg swelling: Prescribed Lasix  Which led to improvement in leg swelling.  Cardiomyopathy: S/P MRI heart. Stable. Patient received dexrazoxaneduring chemotherapy with Adriamycin.She will need an echocardiogram at some time in the future  Monitoring closely for toxicities.   return to clinic in 2 weeks for follow-up

## 2014-06-24 NOTE — Telephone Encounter (Signed)
per pof to sch pt appt-gave pt copy of sch °

## 2014-06-24 NOTE — Patient Instructions (Signed)
[facility] Discharge Instructions for Patients Receiving Chemotherapy  Today you received the following chemotherapy agents: Abraxane.  To help prevent nausea and vomiting after your treatment, we encourage you to take your nausea medication as directed  If you develop nausea and vomiting that is not controlled by your nausea medication, call the clinic.   BELOW ARE SYMPTOMS THAT SHOULD BE REPORTED IMMEDIATELY:  *FEVER GREATER THAN 100.5 F  *CHILLS WITH OR WITHOUT FEVER  NAUSEA AND VOMITING THAT IS NOT CONTROLLED WITH YOUR NAUSEA MEDICATION  *UNUSUAL SHORTNESS OF BREATH  *UNUSUAL BRUISING OR BLEEDING  TENDERNESS IN MOUTH AND THROAT WITH OR WITHOUT PRESENCE OF ULCERS  *URINARY PROBLEMS  *BOWEL PROBLEMS  UNUSUAL RASH Items with * indicate a potential emergency and should be followed up as soon as possible.  Feel free to call the clinic you have any questions or concerns. The clinic phone number is [phone].  

## 2014-06-26 ENCOUNTER — Ambulatory Visit: Payer: Medicare Other

## 2014-07-01 ENCOUNTER — Ambulatory Visit (HOSPITAL_BASED_OUTPATIENT_CLINIC_OR_DEPARTMENT_OTHER): Payer: Medicare Other

## 2014-07-01 ENCOUNTER — Other Ambulatory Visit (HOSPITAL_BASED_OUTPATIENT_CLINIC_OR_DEPARTMENT_OTHER): Payer: Medicare Other

## 2014-07-01 DIAGNOSIS — C50811 Malignant neoplasm of overlapping sites of right female breast: Secondary | ICD-10-CM

## 2014-07-01 DIAGNOSIS — Z5111 Encounter for antineoplastic chemotherapy: Secondary | ICD-10-CM | POA: Diagnosis not present

## 2014-07-01 DIAGNOSIS — C50411 Malignant neoplasm of upper-outer quadrant of right female breast: Secondary | ICD-10-CM

## 2014-07-01 LAB — CBC WITH DIFFERENTIAL/PLATELET
BASO%: 1.1 % (ref 0.0–2.0)
Basophils Absolute: 0.1 10*3/uL (ref 0.0–0.1)
EOS%: 5.2 % (ref 0.0–7.0)
Eosinophils Absolute: 0.4 10*3/uL (ref 0.0–0.5)
HEMATOCRIT: 35.9 % (ref 34.8–46.6)
HEMOGLOBIN: 11.6 g/dL (ref 11.6–15.9)
LYMPH%: 25.6 % (ref 14.0–49.7)
MCH: 29.1 pg (ref 25.1–34.0)
MCHC: 32.4 g/dL (ref 31.5–36.0)
MCV: 89.8 fL (ref 79.5–101.0)
MONO#: 0.5 10*3/uL (ref 0.1–0.9)
MONO%: 7 % (ref 0.0–14.0)
NEUT#: 4.3 10*3/uL (ref 1.5–6.5)
NEUT%: 61.1 % (ref 38.4–76.8)
PLATELETS: 352 10*3/uL (ref 145–400)
RBC: 4 10*6/uL (ref 3.70–5.45)
RDW: 16.8 % — ABNORMAL HIGH (ref 11.2–14.5)
WBC: 7 10*3/uL (ref 3.9–10.3)
lymph#: 1.8 10*3/uL (ref 0.9–3.3)

## 2014-07-01 LAB — COMPREHENSIVE METABOLIC PANEL (CC13)
ALT: 20 U/L (ref 0–55)
ANION GAP: 13 meq/L — AB (ref 3–11)
AST: 21 U/L (ref 5–34)
Albumin: 3.7 g/dL (ref 3.5–5.0)
Alkaline Phosphatase: 80 U/L (ref 40–150)
BILIRUBIN TOTAL: 0.76 mg/dL (ref 0.20–1.20)
BUN: 7 mg/dL (ref 7.0–26.0)
CALCIUM: 9.3 mg/dL (ref 8.4–10.4)
CO2: 22 meq/L (ref 22–29)
Chloride: 102 mEq/L (ref 98–109)
Creatinine: 0.8 mg/dL (ref 0.6–1.1)
EGFR: 81 mL/min/{1.73_m2} — AB (ref >90)
Glucose: 109 mg/dl (ref 70–140)
Potassium: 3.4 mEq/L — ABNORMAL LOW (ref 3.5–5.1)
Sodium: 138 mEq/L (ref 136–145)
TOTAL PROTEIN: 7 g/dL (ref 6.4–8.3)

## 2014-07-01 MED ORDER — SODIUM CHLORIDE 0.9 % IV SOLN
Freq: Once | INTRAVENOUS | Status: AC
  Administered 2014-07-01: 14:00:00 via INTRAVENOUS

## 2014-07-01 MED ORDER — ONDANSETRON 8 MG/NS 50 ML IVPB
INTRAVENOUS | Status: AC
  Filled 2014-07-01: qty 8

## 2014-07-01 MED ORDER — HEPARIN SOD (PORK) LOCK FLUSH 100 UNIT/ML IV SOLN
500.0000 [IU] | Freq: Once | INTRAVENOUS | Status: AC | PRN
  Administered 2014-07-01: 500 [IU]
  Filled 2014-07-01: qty 5

## 2014-07-01 MED ORDER — PACLITAXEL PROTEIN-BOUND CHEMO INJECTION 100 MG
80.0000 mg/m2 | Freq: Once | INTRAVENOUS | Status: AC
  Administered 2014-07-01: 150 mg via INTRAVENOUS
  Filled 2014-07-01: qty 30

## 2014-07-01 MED ORDER — ONDANSETRON 8 MG/50ML IVPB (CHCC)
8.0000 mg | Freq: Once | INTRAVENOUS | Status: AC
  Administered 2014-07-01: 8 mg via INTRAVENOUS

## 2014-07-01 MED ORDER — SODIUM CHLORIDE 0.9 % IJ SOLN
10.0000 mL | INTRAMUSCULAR | Status: DC | PRN
  Administered 2014-07-01: 10 mL
  Filled 2014-07-01: qty 10

## 2014-07-01 NOTE — Patient Instructions (Signed)
[facility] Discharge Instructions for Patients Receiving Chemotherapy  Today you received the following chemotherapy agents: Abraxane.  To help prevent nausea and vomiting after your treatment, we encourage you to take your nausea medication as directed  If you develop nausea and vomiting that is not controlled by your nausea medication, call the clinic.   BELOW ARE SYMPTOMS THAT SHOULD BE REPORTED IMMEDIATELY:  *FEVER GREATER THAN 100.5 F  *CHILLS WITH OR WITHOUT FEVER  NAUSEA AND VOMITING THAT IS NOT CONTROLLED WITH YOUR NAUSEA MEDICATION  *UNUSUAL SHORTNESS OF BREATH  *UNUSUAL BRUISING OR BLEEDING  TENDERNESS IN MOUTH AND THROAT WITH OR WITHOUT PRESENCE OF ULCERS  *URINARY PROBLEMS  *BOWEL PROBLEMS  UNUSUAL RASH Items with * indicate a potential emergency and should be followed up as soon as possible.  Feel free to call the clinic you have any questions or concerns. The clinic phone number is [phone].  

## 2014-07-07 ENCOUNTER — Other Ambulatory Visit (HOSPITAL_BASED_OUTPATIENT_CLINIC_OR_DEPARTMENT_OTHER): Payer: Medicare Other

## 2014-07-07 ENCOUNTER — Telehealth: Payer: Self-pay | Admitting: Hematology and Oncology

## 2014-07-07 ENCOUNTER — Telehealth: Payer: Self-pay | Admitting: *Deleted

## 2014-07-07 ENCOUNTER — Ambulatory Visit (HOSPITAL_BASED_OUTPATIENT_CLINIC_OR_DEPARTMENT_OTHER): Payer: Medicare Other | Admitting: Hematology and Oncology

## 2014-07-07 VITALS — BP 130/55 | HR 72 | Temp 98.3°F | Resp 18 | Ht 66.0 in | Wt 189.9 lb

## 2014-07-07 DIAGNOSIS — R110 Nausea: Secondary | ICD-10-CM | POA: Diagnosis not present

## 2014-07-07 DIAGNOSIS — G629 Polyneuropathy, unspecified: Secondary | ICD-10-CM

## 2014-07-07 DIAGNOSIS — R600 Localized edema: Secondary | ICD-10-CM

## 2014-07-07 DIAGNOSIS — C50811 Malignant neoplasm of overlapping sites of right female breast: Secondary | ICD-10-CM

## 2014-07-07 DIAGNOSIS — C50411 Malignant neoplasm of upper-outer quadrant of right female breast: Secondary | ICD-10-CM

## 2014-07-07 DIAGNOSIS — D701 Agranulocytosis secondary to cancer chemotherapy: Secondary | ICD-10-CM | POA: Diagnosis not present

## 2014-07-07 DIAGNOSIS — R5383 Other fatigue: Secondary | ICD-10-CM

## 2014-07-07 LAB — COMPREHENSIVE METABOLIC PANEL (CC13)
ALBUMIN: 3.5 g/dL (ref 3.5–5.0)
ALK PHOS: 68 U/L (ref 40–150)
ALT: 16 U/L (ref 0–55)
AST: 17 U/L (ref 5–34)
Anion Gap: 8 mEq/L (ref 3–11)
BUN: 11.2 mg/dL (ref 7.0–26.0)
CALCIUM: 9.1 mg/dL (ref 8.4–10.4)
CO2: 22 mEq/L (ref 22–29)
Chloride: 107 mEq/L (ref 98–109)
Creatinine: 0.7 mg/dL (ref 0.6–1.1)
EGFR: >90 mL/min/{1.73_m2} (ref >90)
Glucose: 101 mg/dl (ref 70–140)
Potassium: 4 mEq/L (ref 3.5–5.1)
Sodium: 137 mEq/L (ref 136–145)
Total Bilirubin: 0.57 mg/dL (ref 0.20–1.20)
Total Protein: 6.4 g/dL (ref 6.4–8.3)

## 2014-07-07 LAB — CBC WITH DIFFERENTIAL/PLATELET
BASO%: 1.4 % (ref 0.0–2.0)
BASOS ABS: 0.1 10*3/uL (ref 0.0–0.1)
EOS%: 6.1 % (ref 0.0–7.0)
Eosinophils Absolute: 0.2 10*3/uL (ref 0.0–0.5)
HEMATOCRIT: 33 % — AB (ref 34.8–46.6)
HEMOGLOBIN: 10.8 g/dL — AB (ref 11.6–15.9)
LYMPH%: 26 % (ref 14.0–49.7)
MCH: 29.2 pg (ref 25.1–34.0)
MCHC: 32.6 g/dL (ref 31.5–36.0)
MCV: 89.6 fL (ref 79.5–101.0)
MONO#: 0.3 10*3/uL (ref 0.1–0.9)
MONO%: 8.5 % (ref 0.0–14.0)
NEUT#: 2.2 10*3/uL (ref 1.5–6.5)
NEUT%: 58 % (ref 38.4–76.8)
Platelets: 293 10*3/uL (ref 145–400)
RBC: 3.69 10*6/uL — ABNORMAL LOW (ref 3.70–5.45)
RDW: 16.4 % — ABNORMAL HIGH (ref 11.2–14.5)
WBC: 3.8 10*3/uL — AB (ref 3.9–10.3)
lymph#: 1 10*3/uL (ref 0.9–3.3)

## 2014-07-07 NOTE — Telephone Encounter (Signed)
, °

## 2014-07-07 NOTE — Progress Notes (Signed)
Patient Care Team: Laurey Morale, MD as PCP - General Excell Seltzer, MD as Consulting Physician (General Surgery) Nicholas Lose, MD as Consulting Physician (Hematology and Oncology) Arloa Koh, MD as Consulting Physician (Radiation Oncology) Holley Bouche, NP as Nurse Practitioner (Nurse Practitioner)  DIAGNOSIS: Breast cancer of upper-outer quadrant of right female breast   Staging form: Breast, AJCC 7th Edition     Clinical stage from 04/01/2014: Stage IIB (T3, N0, M0) - Unsigned       Staging comments: Staged at breast conference on 12.2.15    SUMMARY OF ONCOLOGIC HISTORY:   Breast cancer of upper-outer quadrant of right female breast   03/18/2014 Mammogram Right breast suspicious mass 10:00 4.9 x 3 x 5 cm, 2 lower right axillary lymph nodes mildly thickened cortices   03/23/2014 Initial Biopsy Right breast needle biopsy 9:00: Invasive ductal carcinoma grade 3, ER PR HER-2 negative ratio 1.3   03/31/2014 Breast MRI Right breast mass 9:00 upper outer quadrant with contiguous skin involvement by 5.7 cm, right axillary lymph nodes normal in size   04/10/2014 -  Neo-Adjuvant Chemotherapy Dose dense Adriamycin and Cytoxan 4 followed by weekly Taxol 12 after week 1 Taxol changed to Abraxane    CHIEF COMPLIANT: Tomorrow week 4 of Abraxane  INTERVAL HISTORY: Stephanie Olsen is a 69 year old with a history of right breast cancer being treated with neoadjuvant chemotherapy. Since switching from taxol to Abraxane, she has been doing much better. She feels her energy levels are back these and appetite are back to the point that she was asking if she can drink some wine intermittently. I've instructed her not to drink any alcohol.  REVIEW OF SYSTEMS:   Constitutional: Denies fevers, chills or abnormal weight loss Eyes: Denies blurriness of vision Ears, nose, mouth, throat, and face: Denies mucositis or sore throat Respiratory: Denies cough, dyspnea or wheezes Cardiovascular: Denies  palpitation, chest discomfort or lower extremity swelling Gastrointestinal:  Denies nausea, heartburn or change in bowel habits Skin: Denies abnormal skin rashes Lymphatics: Denies new lymphadenopathy or easy bruising Neurological:Denies numbness, tingling or new weaknesses Behavioral/Psych: Mood is stable, no new changes  Breast:  denies any pain or lumps or nodules in either breasts All other systems were reviewed with the patient and are negative.  I have reviewed the past medical history, past surgical history, social history and family history with the patient and they are unchanged from previous note.  ALLERGIES:  has No Known Allergies.  MEDICATIONS:  Current Outpatient Prescriptions  Medication Sig Dispense Refill  . Ascorbic Acid (VITAMIN C) 500 MG tablet Take 500 mg by mouth 2 (two) times daily.     Marland Kitchen aspirin 81 MG tablet Take 81 mg by mouth every morning.     . beta carotene w/minerals (OCUVITE) tablet Take 1 tablet by mouth every morning.     . Calcium Carb-Cholecalciferol (CALCIUM 600/VITAMIN D3) 600-800 MG-UNIT TABS Take 1 tablet by mouth every morning.    . carvedilol (COREG) 3.125 MG tablet Take 1 tablet (3.125 mg total) by mouth 2 (two) times daily. 180 tablet 3  . cetirizine (ZYRTEC) 10 MG tablet Take 10 mg by mouth every morning.     . clonazePAM (KLONOPIN) 2 MG tablet Take 1 tablet (2 mg total) by mouth 2 (two) times daily as needed for anxiety. (Patient taking differently: Take 1 mg by mouth 2 (two) times daily as needed for anxiety. ) 180 tablet 1  . cyclobenzaprine (FLEXERIL) 10 MG tablet Take 1 tablet (10 mg  total) by mouth 3 (three) times daily as needed for muscle spasms. For spasms 90 tablet 5  . fish oil-omega-3 fatty acids 1000 MG capsule Take 1 g by mouth every morning.     . fluticasone (FLONASE) 50 MCG/ACT nasal spray INSTILL 2 SPRAYS INTO THE NOSE DAILY 48 g 1  . furosemide (LASIX) 20 MG tablet Take 1 tablet (20 mg total) by mouth daily. 30 tablet 1  .  HYDROcodone-acetaminophen (NORCO/VICODIN) 5-325 MG per tablet Take 1-2 tablets by mouth every 4 (four) hours as needed for moderate pain or severe pain. 25 tablet 0  . ibuprofen (ADVIL,MOTRIN) 200 MG tablet Take 200 mg by mouth every 6 (six) hours as needed for headache or mild pain.    Marland Kitchen ketorolac (ACULAR) 0.5 % ophthalmic solution   0  . latanoprost (XALATAN) 0.005 % ophthalmic solution Place 1 drop into both eyes at bedtime.    . Magnesium 500 MG TABS Take 1 tablet by mouth at bedtime.    . metoCLOPramide (REGLAN) 10 MG tablet   5  . metroNIDAZOLE (METROGEL) 0.75 % gel Apply 1 application topically 2 (two) times daily as needed (rosacea on face.).     Marland Kitchen nystatin (MYCOSTATIN) 100000 UNIT/ML suspension Take 5 mLs (500,000 Units total) by mouth 4 (four) times daily. 60 mL 0  . timolol (BETIMOL) 0.5 % ophthalmic solution Place 1 drop into both eyes every morning.    Marland Kitchen UNABLE TO FIND Apply 1 each topically as needed. 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 1 each 1  . lisinopril (PRINIVIL,ZESTRIL) 10 MG tablet Take 1 tablet (10 mg total) by mouth daily. (Patient not taking: Reported on 06/12/2014) 90 tablet 3   No current facility-administered medications for this visit.   Facility-Administered Medications Ordered in Other Visits  Medication Dose Route Frequency Provider Last Rate Last Dose  . sodium chloride 0.9 % injection 10 mL  10 mL Intracatheter PRN Nicholas Lose, MD   10 mL at 06/17/14 1635    PHYSICAL EXAMINATION: ECOG PERFORMANCE STATUS: 1 - Symptomatic but completely ambulatory  Filed Vitals:   07/07/14 0910  BP: 130/55  Pulse: 72  Temp: 98.3 F (36.8 C)  Resp: 18   Filed Weights   07/07/14 0910  Weight: 189 lb 14.4 oz (86.138 kg)    GENERAL:alert, no distress and comfortable SKIN: skin color, texture, turgor are normal, no rashes or significant lesions EYES: normal, Conjunctiva are pink and non-injected, sclera  clear OROPHARYNX:no exudate, no erythema and lips, buccal mucosa, and tongue normal  NECK: supple, thyroid normal size, non-tender, without nodularity LYMPH:  no palpable lymphadenopathy in the cervical, axillary or inguinal LUNGS: clear to auscultation and percussion with normal breathing effort HEART: regular rate & rhythm and no murmurs and 1+ lower extremity edema ABDOMEN:abdomen soft, non-tender and normal bowel sounds Musculoskeletal:no cyanosis of digits and no clubbing  NEURO: Neuropathy in fingers and toes much improved   LABORATORY DATA:  I have reviewed the data as listed   Chemistry      Component Value Date/Time   NA 138 07/01/2014 1241   NA 134* 02/12/2014 1108   K 3.4* 07/01/2014 1241   K 4.7 02/12/2014 1108   CL 100 02/12/2014 1108   CO2 22 07/01/2014 1241   CO2 25 02/12/2014 1108   BUN 7.0 07/01/2014 1241   BUN 10 02/12/2014 1108   CREATININE 0.8 07/01/2014 1241   CREATININE 0.8 02/12/2014 1108      Component Value  Date/Time   CALCIUM 9.3 07/01/2014 1241   CALCIUM 9.5 02/12/2014 1108   ALKPHOS 80 07/01/2014 1241   ALKPHOS 71 02/12/2014 1108   AST 21 07/01/2014 1241   AST 22 02/12/2014 1108   ALT 20 07/01/2014 1241   ALT 22 02/12/2014 1108   BILITOT 0.76 07/01/2014 1241   BILITOT 0.5 02/12/2014 1108       Lab Results  Component Value Date   WBC 3.8* 07/07/2014   HGB 10.8* 07/07/2014   HCT 33.0* 07/07/2014   MCV 89.6 07/07/2014   PLT 293 07/07/2014   NEUTROABS 2.2 07/07/2014    ASSESSMENT & PLAN:  Breast cancer of upper-outer quadrant of right female breast Right breast invasive ductal carcinoma, palpable mass, 5 cm by mammogram and 5.7 cm by MRI grade 3, ER PR HER-2 negative, associated skin dimpling, T3, N0, M0 stage IIB Ki-67 90%  Current treatment: Neoadjuvant chemotherapy with dose dense Adriamycin and Cytoxan 04/10/2014. Treatment Plan is for 4 cycles Adriamycin and Cytoxanfollowed by weekly Taxol x1 changed to Abraxane 11 from  06/17/2014; Tomorrow is week 4/12 of Abraxane  Chemotherapy-related toxicities: 1. Chemotherapy induced neutropenia grade 3: Chemotherapy dosage of AC was reduced for cycle 2 2. Nausea grade 1 3. Loss of taste 4. Decreased appetite  5. Bronchitis after cycle 2 treated with amoxicillin 6. Yeast infection: improved with Diflucan 7. Thrush in the throat: resolved with Diflucan 8. UTI: Resolved with Cipro 9. Fatigue: Seem to be cumulatively getting worse 10. Neuropathy in her feet grade 2 with just 1 dose of Taxol: Hence wechanged treatment to Abraxane. On Abraxane symptoms have improved. 11. Leg swelling: Prescribed Lasix Which led to improvement in leg swelling. Still has 1+ edema  Cardiomyopathy: S/P MRI heart. Stable. Patient received dexrazoxaneduring chemotherapy with Adriamycin.Will order another echocardiogram to assess cardio toxicity from anthracyclines  Monitoring closely for toxicities.  return to clinic in 2 weeks for follow-up No orders of the defined types were placed in this encounter.   The patient has a good understanding of the overall plan. she agrees with it. She will call with any problems that may develop before her next visit here.   Rulon Eisenmenger, MD

## 2014-07-07 NOTE — Telephone Encounter (Signed)
I have adjusted 3/23 appt 

## 2014-07-07 NOTE — Assessment & Plan Note (Signed)
Right breast invasive ductal carcinoma, palpable mass, 5 cm by mammogram and 5.7 cm by MRI grade 3, ER PR HER-2 negative, associated skin dimpling, T3, N0, M0 stage IIB Ki-67 90%  Current treatment: Neoadjuvant chemotherapy with dose dense Adriamycin and Cytoxan 04/10/2014. Treatment Plan is for 4 cycles Adriamycin and Cytoxanfollowed by weekly Taxol x1 changed to Abraxane 11 from 06/17/2014; Today is week 4/12 of Abraxane   Chemotherapy-related toxicities: 1. Chemotherapy induced neutropenia grade 3: Chemotherapy dosage of AC was reduced for cycle 2 2. Nausea grade 1 3. Loss of taste 4. Decreased appetite  5. Bronchitis after cycle 2 treated with amoxicillin 6. Yeast infection: improved with Diflucan 7. Thrush in the throat: resolved with Diflucan 8. UTI: Resolved with Cipro 9. Fatigue: Seem to be cumulatively getting worse 10. Neuropathy in her feet grade 2 with just 1 dose of Taxol: Hence wechanged treatment to Abraxane. On Abraxane symptoms have improved. 11. Leg swelling: Prescribed Lasix Which led to improvement in leg swelling.  Cardiomyopathy: S/P MRI heart. Stable. Patient received dexrazoxaneduring chemotherapy with Adriamycin.Will order another echocardiogram to assess cardio toxicity from anthracyclines  Monitoring closely for toxicities.  return to clinic in 2 weeks for follow-up

## 2014-07-08 ENCOUNTER — Ambulatory Visit (HOSPITAL_BASED_OUTPATIENT_CLINIC_OR_DEPARTMENT_OTHER): Payer: Medicare Other

## 2014-07-08 ENCOUNTER — Other Ambulatory Visit: Payer: Medicare Other

## 2014-07-08 DIAGNOSIS — Z5111 Encounter for antineoplastic chemotherapy: Secondary | ICD-10-CM

## 2014-07-08 DIAGNOSIS — C50811 Malignant neoplasm of overlapping sites of right female breast: Secondary | ICD-10-CM

## 2014-07-08 DIAGNOSIS — C50411 Malignant neoplasm of upper-outer quadrant of right female breast: Secondary | ICD-10-CM

## 2014-07-08 MED ORDER — SODIUM CHLORIDE 0.9 % IV SOLN
Freq: Once | INTRAVENOUS | Status: AC
  Administered 2014-07-08: 15:00:00 via INTRAVENOUS
  Filled 2014-07-08: qty 4

## 2014-07-08 MED ORDER — HEPARIN SOD (PORK) LOCK FLUSH 100 UNIT/ML IV SOLN
500.0000 [IU] | Freq: Once | INTRAVENOUS | Status: AC | PRN
  Administered 2014-07-08: 500 [IU]
  Filled 2014-07-08: qty 5

## 2014-07-08 MED ORDER — SODIUM CHLORIDE 0.9 % IV SOLN
Freq: Once | INTRAVENOUS | Status: AC
  Administered 2014-07-08: 15:00:00 via INTRAVENOUS

## 2014-07-08 MED ORDER — SODIUM CHLORIDE 0.9 % IJ SOLN
10.0000 mL | INTRAMUSCULAR | Status: DC | PRN
  Administered 2014-07-08: 10 mL
  Filled 2014-07-08: qty 10

## 2014-07-08 MED ORDER — PACLITAXEL PROTEIN-BOUND CHEMO INJECTION 100 MG
80.0000 mg/m2 | Freq: Once | INTRAVENOUS | Status: AC
  Administered 2014-07-08: 150 mg via INTRAVENOUS
  Filled 2014-07-08: qty 30

## 2014-07-08 NOTE — Patient Instructions (Signed)
[facility] Discharge Instructions for Patients Receiving Chemotherapy  Today you received the following chemotherapy agents: Abraxane.  To help prevent nausea and vomiting after your treatment, we encourage you to take your nausea medication as directed  If you develop nausea and vomiting that is not controlled by your nausea medication, call the clinic.   BELOW ARE SYMPTOMS THAT SHOULD BE REPORTED IMMEDIATELY:  *FEVER GREATER THAN 100.5 F  *CHILLS WITH OR WITHOUT FEVER  NAUSEA AND VOMITING THAT IS NOT CONTROLLED WITH YOUR NAUSEA MEDICATION  *UNUSUAL SHORTNESS OF BREATH  *UNUSUAL BRUISING OR BLEEDING  TENDERNESS IN MOUTH AND THROAT WITH OR WITHOUT PRESENCE OF ULCERS  *URINARY PROBLEMS  *BOWEL PROBLEMS  UNUSUAL RASH Items with * indicate a potential emergency and should be followed up as soon as possible.  Feel free to call the clinic you have any questions or concerns. The clinic phone number is [phone].  

## 2014-07-15 ENCOUNTER — Other Ambulatory Visit (HOSPITAL_BASED_OUTPATIENT_CLINIC_OR_DEPARTMENT_OTHER): Payer: Medicare Other

## 2014-07-15 ENCOUNTER — Ambulatory Visit (HOSPITAL_BASED_OUTPATIENT_CLINIC_OR_DEPARTMENT_OTHER): Payer: Medicare Other

## 2014-07-15 DIAGNOSIS — C50411 Malignant neoplasm of upper-outer quadrant of right female breast: Secondary | ICD-10-CM

## 2014-07-15 DIAGNOSIS — C50811 Malignant neoplasm of overlapping sites of right female breast: Secondary | ICD-10-CM

## 2014-07-15 DIAGNOSIS — Z5111 Encounter for antineoplastic chemotherapy: Secondary | ICD-10-CM

## 2014-07-15 LAB — COMPREHENSIVE METABOLIC PANEL (CC13)
ALK PHOS: 67 U/L (ref 40–150)
ALT: 24 U/L (ref 0–55)
AST: 22 U/L (ref 5–34)
Albumin: 3.6 g/dL (ref 3.5–5.0)
Anion Gap: 10 mEq/L (ref 3–11)
BUN: 7.5 mg/dL (ref 7.0–26.0)
CO2: 23 mEq/L (ref 22–29)
Calcium: 9.2 mg/dL (ref 8.4–10.4)
Chloride: 105 mEq/L (ref 98–109)
Creatinine: 0.7 mg/dL (ref 0.6–1.1)
EGFR: 87 mL/min/{1.73_m2} — AB (ref >90)
Glucose: 101 mg/dl (ref 70–140)
Potassium: 3.7 mEq/L (ref 3.5–5.1)
Sodium: 139 mEq/L (ref 136–145)
Total Bilirubin: 0.73 mg/dL (ref 0.20–1.20)
Total Protein: 6.5 g/dL (ref 6.4–8.3)

## 2014-07-15 LAB — CBC WITH DIFFERENTIAL/PLATELET
BASO%: 1.5 % (ref 0.0–2.0)
Basophils Absolute: 0.1 10*3/uL (ref 0.0–0.1)
EOS ABS: 0.3 10*3/uL (ref 0.0–0.5)
EOS%: 5.6 % (ref 0.0–7.0)
HEMATOCRIT: 33.2 % — AB (ref 34.8–46.6)
HEMOGLOBIN: 10.9 g/dL — AB (ref 11.6–15.9)
LYMPH%: 24.6 % (ref 14.0–49.7)
MCH: 29.7 pg (ref 25.1–34.0)
MCHC: 32.9 g/dL (ref 31.5–36.0)
MCV: 90.2 fL (ref 79.5–101.0)
MONO#: 0.4 10*3/uL (ref 0.1–0.9)
MONO%: 7.8 % (ref 0.0–14.0)
NEUT#: 3.1 10*3/uL (ref 1.5–6.5)
NEUT%: 60.5 % (ref 38.4–76.8)
PLATELETS: 333 10*3/uL (ref 145–400)
RBC: 3.68 10*6/uL — ABNORMAL LOW (ref 3.70–5.45)
RDW: 17.4 % — ABNORMAL HIGH (ref 11.2–14.5)
WBC: 5.2 10*3/uL (ref 3.9–10.3)
lymph#: 1.3 10*3/uL (ref 0.9–3.3)

## 2014-07-15 MED ORDER — PACLITAXEL PROTEIN-BOUND CHEMO INJECTION 100 MG
80.0000 mg/m2 | Freq: Once | INTRAVENOUS | Status: AC
  Administered 2014-07-15: 150 mg via INTRAVENOUS
  Filled 2014-07-15: qty 30

## 2014-07-15 MED ORDER — SODIUM CHLORIDE 0.9 % IV SOLN
Freq: Once | INTRAVENOUS | Status: AC
  Administered 2014-07-15: 15:00:00 via INTRAVENOUS

## 2014-07-15 MED ORDER — SODIUM CHLORIDE 0.9 % IJ SOLN
10.0000 mL | INTRAMUSCULAR | Status: DC | PRN
  Administered 2014-07-15: 10 mL
  Filled 2014-07-15: qty 10

## 2014-07-15 MED ORDER — HEPARIN SOD (PORK) LOCK FLUSH 100 UNIT/ML IV SOLN
500.0000 [IU] | Freq: Once | INTRAVENOUS | Status: AC | PRN
  Administered 2014-07-15: 500 [IU]
  Filled 2014-07-15: qty 5

## 2014-07-15 MED ORDER — SODIUM CHLORIDE 0.9 % IV SOLN
Freq: Once | INTRAVENOUS | Status: AC
  Administered 2014-07-15: 15:00:00 via INTRAVENOUS
  Filled 2014-07-15: qty 4

## 2014-07-15 NOTE — Patient Instructions (Signed)
[place] Discharge Instructions for Patients Receiving Chemotherapy  Today you received the following chemotherapy agents: Abraxane.  To help prevent nausea and vomiting after your treatment, we encourage you to take your nausea medication: as directed.   If you develop nausea and vomiting that is not controlled by your nausea medication, call the clinic.   BELOW ARE SYMPTOMS THAT SHOULD BE REPORTED IMMEDIATELY:  *FEVER GREATER THAN 100.5 F  *CHILLS WITH OR WITHOUT FEVER  NAUSEA AND VOMITING THAT IS NOT CONTROLLED WITH YOUR NAUSEA MEDICATION  *UNUSUAL SHORTNESS OF BREATH  *UNUSUAL BRUISING OR BLEEDING  TENDERNESS IN MOUTH AND THROAT WITH OR WITHOUT PRESENCE OF ULCERS  *URINARY PROBLEMS  *BOWEL PROBLEMS  UNUSUAL RASH Items with * indicate a potential emergency and should be followed up as soon as possible.  Feel free to call the clinic you have any questions or concerns. The clinic phone number is [phone].  Please show the [place] at check to the Emergency Department and triage nurse.

## 2014-07-22 ENCOUNTER — Telehealth: Payer: Self-pay | Admitting: Hematology and Oncology

## 2014-07-22 ENCOUNTER — Ambulatory Visit (HOSPITAL_BASED_OUTPATIENT_CLINIC_OR_DEPARTMENT_OTHER): Payer: Medicare Other

## 2014-07-22 ENCOUNTER — Other Ambulatory Visit: Payer: Medicare Other

## 2014-07-22 ENCOUNTER — Other Ambulatory Visit (HOSPITAL_BASED_OUTPATIENT_CLINIC_OR_DEPARTMENT_OTHER): Payer: Medicare Other

## 2014-07-22 ENCOUNTER — Ambulatory Visit (HOSPITAL_BASED_OUTPATIENT_CLINIC_OR_DEPARTMENT_OTHER): Payer: Medicare Other | Admitting: Hematology and Oncology

## 2014-07-22 VITALS — BP 132/78 | HR 81 | Temp 98.1°F | Resp 18 | Ht 66.0 in | Wt 192.0 lb

## 2014-07-22 DIAGNOSIS — R5383 Other fatigue: Secondary | ICD-10-CM | POA: Diagnosis not present

## 2014-07-22 DIAGNOSIS — G620 Drug-induced polyneuropathy: Secondary | ICD-10-CM | POA: Diagnosis not present

## 2014-07-22 DIAGNOSIS — C50811 Malignant neoplasm of overlapping sites of right female breast: Secondary | ICD-10-CM

## 2014-07-22 DIAGNOSIS — R609 Edema, unspecified: Secondary | ICD-10-CM

## 2014-07-22 DIAGNOSIS — C50411 Malignant neoplasm of upper-outer quadrant of right female breast: Secondary | ICD-10-CM

## 2014-07-22 DIAGNOSIS — D701 Agranulocytosis secondary to cancer chemotherapy: Secondary | ICD-10-CM | POA: Diagnosis not present

## 2014-07-22 DIAGNOSIS — Z5111 Encounter for antineoplastic chemotherapy: Secondary | ICD-10-CM | POA: Diagnosis not present

## 2014-07-22 DIAGNOSIS — R630 Anorexia: Secondary | ICD-10-CM | POA: Diagnosis not present

## 2014-07-22 LAB — COMPREHENSIVE METABOLIC PANEL (CC13)
ALT: 22 U/L (ref 0–55)
ANION GAP: 10 meq/L (ref 3–11)
AST: 20 U/L (ref 5–34)
Albumin: 3.6 g/dL (ref 3.5–5.0)
Alkaline Phosphatase: 69 U/L (ref 40–150)
BUN: 7.4 mg/dL (ref 7.0–26.0)
CALCIUM: 9.2 mg/dL (ref 8.4–10.4)
CHLORIDE: 106 meq/L (ref 98–109)
CO2: 20 meq/L — AB (ref 22–29)
Creatinine: 0.7 mg/dL (ref 0.6–1.1)
Glucose: 98 mg/dl (ref 70–140)
POTASSIUM: 4 meq/L (ref 3.5–5.1)
Sodium: 136 mEq/L (ref 136–145)
Total Bilirubin: 0.7 mg/dL (ref 0.20–1.20)
Total Protein: 6.6 g/dL (ref 6.4–8.3)

## 2014-07-22 LAB — CBC WITH DIFFERENTIAL/PLATELET
BASO%: 0.5 % (ref 0.0–2.0)
BASOS ABS: 0 10*3/uL (ref 0.0–0.1)
EOS%: 6.1 % (ref 0.0–7.0)
Eosinophils Absolute: 0.3 10*3/uL (ref 0.0–0.5)
HEMATOCRIT: 33 % — AB (ref 34.8–46.6)
HEMOGLOBIN: 11 g/dL — AB (ref 11.6–15.9)
LYMPH#: 1.4 10*3/uL (ref 0.9–3.3)
LYMPH%: 32.8 % (ref 14.0–49.7)
MCH: 29.5 pg (ref 25.1–34.0)
MCHC: 33.3 g/dL (ref 31.5–36.0)
MCV: 88.5 fL (ref 79.5–101.0)
MONO#: 0.4 10*3/uL (ref 0.1–0.9)
MONO%: 8.4 % (ref 0.0–14.0)
NEUT#: 2.2 10*3/uL (ref 1.5–6.5)
NEUT%: 52.2 % (ref 38.4–76.8)
PLATELETS: 311 10*3/uL (ref 145–400)
RBC: 3.73 10*6/uL (ref 3.70–5.45)
RDW: 15.6 % — ABNORMAL HIGH (ref 11.2–14.5)
WBC: 4.3 10*3/uL (ref 3.9–10.3)
nRBC: 0 % (ref 0–0)

## 2014-07-22 MED ORDER — PACLITAXEL PROTEIN-BOUND CHEMO INJECTION 100 MG
80.0000 mg/m2 | Freq: Once | INTRAVENOUS | Status: AC
  Administered 2014-07-22: 150 mg via INTRAVENOUS
  Filled 2014-07-22: qty 30

## 2014-07-22 MED ORDER — SODIUM CHLORIDE 0.9 % IJ SOLN
10.0000 mL | INTRAMUSCULAR | Status: DC | PRN
  Administered 2014-07-22: 10 mL
  Filled 2014-07-22: qty 10

## 2014-07-22 MED ORDER — SODIUM CHLORIDE 0.9 % IV SOLN
Freq: Once | INTRAVENOUS | Status: AC
  Administered 2014-07-22: 13:00:00 via INTRAVENOUS

## 2014-07-22 MED ORDER — HEPARIN SOD (PORK) LOCK FLUSH 100 UNIT/ML IV SOLN
500.0000 [IU] | Freq: Once | INTRAVENOUS | Status: AC | PRN
  Administered 2014-07-22: 500 [IU]
  Filled 2014-07-22: qty 5

## 2014-07-22 MED ORDER — SODIUM CHLORIDE 0.9 % IV SOLN
Freq: Once | INTRAVENOUS | Status: AC
  Administered 2014-07-22: 13:00:00 via INTRAVENOUS
  Filled 2014-07-22: qty 4

## 2014-07-22 NOTE — Assessment & Plan Note (Signed)
Right breast invasive ductal carcinoma, palpable mass, 5 cm by mammogram and 5.7 cm by MRI grade 3, ER PR HER-2 negative, associated skin dimpling, T3, N0, M0 stage IIB Ki-67 90%  Current treatment: Neoadjuvant chemotherapy with dose dense Adriamycin and Cytoxan 04/10/2014. Treatment Plan is for 4 cycles Adriamycin and Cytoxanfollowed by weekly Taxol x1 changed to Abraxane 11 from 06/17/2014; Tomorrow is week 7/12 of Abraxane  Chemotherapy-related toxicities: 1. Chemotherapy induced neutropenia grade 3: Chemotherapy dosage of AC was reduced for cycle 2 2. Nausea grade 1 3. Loss of taste 4. Decreased appetite  5. Bronchitis after cycle 2 treated with amoxicillin 6. Yeast infection: improved with Diflucan 7. Thrush in the throat: resolved with Diflucan 8. UTI: Resolved with Cipro 9. Fatigue: Seem to be cumulatively getting worse; Grade 2 10. Neuropathy in her feet grade 2 with just 1 dose of Taxol: Hence wechanged treatment to Abraxane. On Abraxane symptoms have improved. 11. Leg swelling: Prescribed Lasix Which led to improvement in leg swelling. Still has 1+ edema  Cardiomyopathy: S/P MRI heart. Stable. Patient received dexrazoxaneduring chemotherapy with Adriamycin.Will order another echocardiogram to assess cardio toxicity from anthracyclines  Monitoring closely for toxicities.  return to clinic in 2 weeks for follow-up 

## 2014-07-22 NOTE — Telephone Encounter (Signed)
per pof to sch pt appt-gave pt copy of sch-sent MW email to move trmt to coordinate w/MD appt-pt aware

## 2014-07-22 NOTE — Patient Instructions (Signed)
[facility] Discharge Instructions for Patients Receiving Chemotherapy  Today you received the following chemotherapy agents abraxane.   To help prevent nausea and vomiting after your treatment, we encourage you to take your nausea medication as directed.   If you develop nausea and vomiting that is not controlled by your nausea medication, call the clinic.   BELOW ARE SYMPTOMS THAT SHOULD BE REPORTED IMMEDIATELY:  *FEVER GREATER THAN 100.5 F  *CHILLS WITH OR WITHOUT FEVER  NAUSEA AND VOMITING THAT IS NOT CONTROLLED WITH YOUR NAUSEA MEDICATION  *UNUSUAL SHORTNESS OF BREATH  *UNUSUAL BRUISING OR BLEEDING  TENDERNESS IN MOUTH AND THROAT WITH OR WITHOUT PRESENCE OF ULCERS  *URINARY PROBLEMS  *BOWEL PROBLEMS  UNUSUAL RASH Items with * indicate a potential emergency and should be followed up as soon as possible.  Feel free to call the clinic you have any questions or concerns. The clinic phone number is [phone].  

## 2014-07-22 NOTE — Progress Notes (Signed)
Patient Care Team: Laurey Morale, MD as PCP - General Excell Seltzer, MD as Consulting Physician (General Surgery) Nicholas Lose, MD as Consulting Physician (Hematology and Oncology) Arloa Koh, MD as Consulting Physician (Radiation Oncology) Holley Bouche, NP as Nurse Practitioner (Nurse Practitioner)  DIAGNOSIS: Breast cancer of upper-outer quadrant of right female breast   Staging form: Breast, AJCC 7th Edition     Clinical stage from 04/01/2014: Stage IIB (T3, N0, M0) - Unsigned       Staging comments: Staged at breast conference on 12.2.15    SUMMARY OF ONCOLOGIC HISTORY:   Breast cancer of upper-outer quadrant of right female breast   03/18/2014 Mammogram Right breast suspicious mass 10:00 4.9 x 3 x 5 cm, 2 lower right axillary lymph nodes mildly thickened cortices   03/23/2014 Initial Biopsy Right breast needle biopsy 9:00: Invasive ductal carcinoma grade 3, ER PR HER-2 negative ratio 1.3   03/31/2014 Breast MRI Right breast mass 9:00 upper outer quadrant with contiguous skin involvement by 5.7 cm, right axillary lymph nodes normal in size   04/10/2014 -  Neo-Adjuvant Chemotherapy Dose dense Adriamycin and Cytoxan 4 followed by weekly Taxol 12 after week 1 Taxol changed to Abraxane    CHIEF COMPLIANT: Weeks 7/12 Abraxane  INTERVAL HISTORY: Stephanie Olsen is a 69 year old lady with above-mentioned history of right-sided breast cancer on neoadjuvant chemotherapy. She is tolerating Abraxane extremely well. Her major side effect is fatigue. Seems to be cumulative and getting worse. She also had mild neuropathy due to Taxol which appears to be improved on slightly better on Abraxane.  REVIEW OF SYSTEMS:   Constitutional: Denies fevers, chills or abnormal weight loss Eyes: Denies blurriness of vision Ears, nose, mouth, throat, and face: Denies mucositis or sore throat Respiratory: Denies cough, dyspnea or wheezes Cardiovascular: Denies palpitation, chest discomfort or  lower extremity swelling Gastrointestinal:  Denies nausea, heartburn or change in bowel habits Skin: Denies abnormal skin rashes Lymphatics: Denies new lymphadenopathy or easy bruising Neurological:Denies numbness, tingling or new weaknesses Behavioral/Psych: Mood is stable, no new changes  Breast:  denies any pain or lumps or nodules in either breasts All other systems were reviewed with the patient and are negative.  I have reviewed the past medical history, past surgical history, social history and family history with the patient and they are unchanged from previous note.  ALLERGIES:  has No Known Allergies.  MEDICATIONS:  Current Outpatient Prescriptions  Medication Sig Dispense Refill  . Ascorbic Acid (VITAMIN C) 500 MG tablet Take 500 mg by mouth 2 (two) times daily.     Marland Kitchen aspirin 81 MG tablet Take 81 mg by mouth every morning.     . beta carotene w/minerals (OCUVITE) tablet Take 1 tablet by mouth every morning.     . Calcium Carb-Cholecalciferol (CALCIUM 600/VITAMIN D3) 600-800 MG-UNIT TABS Take 1 tablet by mouth every morning.    . carvedilol (COREG) 3.125 MG tablet Take 1 tablet (3.125 mg total) by mouth 2 (two) times daily. 180 tablet 3  . cetirizine (ZYRTEC) 10 MG tablet Take 10 mg by mouth every morning.     . clonazePAM (KLONOPIN) 2 MG tablet Take 1 tablet (2 mg total) by mouth 2 (two) times daily as needed for anxiety. (Patient taking differently: Take 1 mg by mouth 2 (two) times daily as needed for anxiety. ) 180 tablet 1  . cyclobenzaprine (FLEXERIL) 10 MG tablet Take 1 tablet (10 mg total) by mouth 3 (three) times daily as needed for muscle spasms.  For spasms 90 tablet 5  . fish oil-omega-3 fatty acids 1000 MG capsule Take 1 g by mouth every morning.     . fluticasone (FLONASE) 50 MCG/ACT nasal spray INSTILL 2 SPRAYS INTO THE NOSE DAILY 48 g 1  . furosemide (LASIX) 20 MG tablet Take 1 tablet (20 mg total) by mouth daily. 30 tablet 1  . HYDROcodone-acetaminophen  (NORCO/VICODIN) 5-325 MG per tablet Take 1-2 tablets by mouth every 4 (four) hours as needed for moderate pain or severe pain. 25 tablet 0  . ibuprofen (ADVIL,MOTRIN) 200 MG tablet Take 200 mg by mouth every 6 (six) hours as needed for headache or mild pain.    Marland Kitchen ketorolac (ACULAR) 0.5 % ophthalmic solution   0  . latanoprost (XALATAN) 0.005 % ophthalmic solution Place 1 drop into both eyes at bedtime.    Marland Kitchen lisinopril (PRINIVIL,ZESTRIL) 10 MG tablet Take 1 tablet (10 mg total) by mouth daily. 90 tablet 3  . Magnesium 500 MG TABS Take 1 tablet by mouth at bedtime.    . metoCLOPramide (REGLAN) 10 MG tablet   5  . metroNIDAZOLE (METROGEL) 0.75 % gel Apply 1 application topically 2 (two) times daily as needed (rosacea on face.).     Marland Kitchen nystatin (MYCOSTATIN) 100000 UNIT/ML suspension Take 5 mLs (500,000 Units total) by mouth 4 (four) times daily. 60 mL 0  . timolol (BETIMOL) 0.5 % ophthalmic solution Place 1 drop into both eyes every morning.    Marland Kitchen UNABLE TO FIND Apply 1 each topically as needed. 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 1 each 1   No current facility-administered medications for this visit.   Facility-Administered Medications Ordered in Other Visits  Medication Dose Route Frequency Provider Last Rate Last Dose  . sodium chloride 0.9 % injection 10 mL  10 mL Intracatheter PRN Nicholas Lose, MD   10 mL at 06/17/14 1635    PHYSICAL EXAMINATION: ECOG PERFORMANCE STATUS: 0 - Asymptomatic  Filed Vitals:   07/22/14 1127  BP: 132/78  Pulse: 81  Temp: 98.1 F (36.7 C)  Resp: 18   Filed Weights   07/22/14 1127  Weight: 192 lb (87.091 kg)    GENERAL:alert, no distress and comfortable SKIN: skin color, texture, turgor are normal, no rashes or significant lesions EYES: normal, Conjunctiva are pink and non-injected, sclera clear OROPHARYNX:no exudate, no erythema and lips, buccal mucosa, and tongue normal  NECK: supple, thyroid normal  size, non-tender, without nodularity LYMPH:  no palpable lymphadenopathy in the cervical, axillary or inguinal LUNGS: clear to auscultation and percussion with normal breathing effort HEART: regular rate & rhythm and no murmurs and no lower extremity edema ABDOMEN:abdomen soft, non-tender and normal bowel sounds Musculoskeletal:no cyanosis of digits and no clubbing  NEURO: alert & oriented x 3 with fluent speech, no focal motor/sensory deficits  LABORATORY DATA:  I have reviewed the data as listed   Chemistry      Component Value Date/Time   NA 136 07/22/2014 1112   NA 134* 02/12/2014 1108   K 4.0 07/22/2014 1112   K 4.7 02/12/2014 1108   CL 100 02/12/2014 1108   CO2 20* 07/22/2014 1112   CO2 25 02/12/2014 1108   BUN 7.4 07/22/2014 1112   BUN 10 02/12/2014 1108   CREATININE 0.7 07/22/2014 1112   CREATININE 0.8 02/12/2014 1108      Component Value Date/Time   CALCIUM 9.2 07/22/2014 1112   CALCIUM 9.5 02/12/2014 1108   ALKPHOS 69 07/22/2014 1112  ALKPHOS 71 02/12/2014 1108   AST 20 07/22/2014 1112   AST 22 02/12/2014 1108   ALT 22 07/22/2014 1112   ALT 22 02/12/2014 1108   BILITOT 0.70 07/22/2014 1112   BILITOT 0.5 02/12/2014 1108       Lab Results  Component Value Date   WBC 4.3 07/22/2014   HGB 11.0* 07/22/2014   HCT 33.0* 07/22/2014   MCV 88.5 07/22/2014   PLT 311 07/22/2014   NEUTROABS 2.2 07/22/2014   ASSESSMENT & PLAN:  Breast cancer of upper-outer quadrant of right female breast Right breast invasive ductal carcinoma, palpable mass, 5 cm by mammogram and 5.7 cm by MRI grade 3, ER PR HER-2 negative, associated skin dimpling, T3, N0, M0 stage IIB Ki-67 90%  Current treatment: Neoadjuvant chemotherapy with dose dense Adriamycin and Cytoxan 04/10/2014. Treatment Plan is for 4 cycles Adriamycin and Cytoxanfollowed by weekly Taxol x1 changed to Abraxane 11 from 06/17/2014; Tomorrow is week 7/12 of Abraxane  Chemotherapy-related toxicities: 1. Chemotherapy  induced neutropenia grade 3: Chemotherapy dosage of AC was reduced for cycle 2 2. Nausea grade 1 3. Loss of taste 4. Decreased appetite  5. Bronchitis after cycle 2 treated with amoxicillin 6. Yeast infection: improved with Diflucan 7. Thrush in the throat: resolved with Diflucan 8. UTI: Resolved with Cipro 9. Fatigue: Seem to be cumulatively getting worse; Grade 2 10. Neuropathy in her feet grade 2 with just 1 dose of Taxol: Hence wechanged treatment to Abraxane. On Abraxane symptoms have improved. 11. Leg swelling: Prescribed Lasix Which led to improvement in leg swelling. Still has 1+ edema  Cardiomyopathy: S/P MRI heart. Stable. Patient received dexrazoxaneduring chemotherapy with Adriamycin.Will order another echocardiogram to assess cardio toxicity from anthracyclines  Monitoring closely for toxicities.  return to clinic in 2 weeks for follow-up    No orders of the defined types were placed in this encounter.   The patient has a good understanding of the overall plan. she agrees with it. She will call with any problems that may develop before her next visit here.   Rulon Eisenmenger, MD

## 2014-07-22 NOTE — Telephone Encounter (Signed)
Added appointments with dr Lindi Adie and patient will get a new schedule in chemo

## 2014-07-29 ENCOUNTER — Other Ambulatory Visit (HOSPITAL_BASED_OUTPATIENT_CLINIC_OR_DEPARTMENT_OTHER): Payer: Medicare Other

## 2014-07-29 ENCOUNTER — Ambulatory Visit (HOSPITAL_BASED_OUTPATIENT_CLINIC_OR_DEPARTMENT_OTHER): Payer: Medicare Other

## 2014-07-29 DIAGNOSIS — C50811 Malignant neoplasm of overlapping sites of right female breast: Secondary | ICD-10-CM

## 2014-07-29 DIAGNOSIS — C50411 Malignant neoplasm of upper-outer quadrant of right female breast: Secondary | ICD-10-CM

## 2014-07-29 DIAGNOSIS — Z5111 Encounter for antineoplastic chemotherapy: Secondary | ICD-10-CM

## 2014-07-29 LAB — COMPREHENSIVE METABOLIC PANEL (CC13)
ALT: 17 U/L (ref 0–55)
AST: 17 U/L (ref 5–34)
Albumin: 3.6 g/dL (ref 3.5–5.0)
Alkaline Phosphatase: 77 U/L (ref 40–150)
Anion Gap: 11 mEq/L (ref 3–11)
BILIRUBIN TOTAL: 0.61 mg/dL (ref 0.20–1.20)
BUN: 6.9 mg/dL — ABNORMAL LOW (ref 7.0–26.0)
CO2: 21 mEq/L — ABNORMAL LOW (ref 22–29)
CREATININE: 0.7 mg/dL (ref 0.6–1.1)
Calcium: 8.9 mg/dL (ref 8.4–10.4)
Chloride: 107 mEq/L (ref 98–109)
EGFR: 90 mL/min/{1.73_m2} — ABNORMAL LOW (ref >90)
GLUCOSE: 105 mg/dL (ref 70–140)
Potassium: 4 mEq/L (ref 3.5–5.1)
Sodium: 140 mEq/L (ref 136–145)
TOTAL PROTEIN: 6.7 g/dL (ref 6.4–8.3)

## 2014-07-29 LAB — CBC WITH DIFFERENTIAL/PLATELET
BASO%: 0.2 % (ref 0.0–2.0)
BASOS ABS: 0 10*3/uL (ref 0.0–0.1)
EOS%: 8.1 % — AB (ref 0.0–7.0)
Eosinophils Absolute: 0.4 10*3/uL (ref 0.0–0.5)
HCT: 35.4 % (ref 34.8–46.6)
HEMOGLOBIN: 11.9 g/dL (ref 11.6–15.9)
LYMPH%: 29.6 % (ref 14.0–49.7)
MCH: 30.1 pg (ref 25.1–34.0)
MCHC: 33.6 g/dL (ref 31.5–36.0)
MCV: 89.4 fL (ref 79.5–101.0)
MONO#: 0.5 10*3/uL (ref 0.1–0.9)
MONO%: 9.5 % (ref 0.0–14.0)
NEUT#: 2.7 10*3/uL (ref 1.5–6.5)
NEUT%: 52.6 % (ref 38.4–76.8)
PLATELETS: 321 10*3/uL (ref 145–400)
RBC: 3.96 10*6/uL (ref 3.70–5.45)
RDW: 15.4 % — ABNORMAL HIGH (ref 11.2–14.5)
WBC: 5.1 10*3/uL (ref 3.9–10.3)
lymph#: 1.5 10*3/uL (ref 0.9–3.3)

## 2014-07-29 MED ORDER — PACLITAXEL PROTEIN-BOUND CHEMO INJECTION 100 MG
80.0000 mg/m2 | Freq: Once | INTRAVENOUS | Status: AC
  Administered 2014-07-29: 150 mg via INTRAVENOUS
  Filled 2014-07-29: qty 30

## 2014-07-29 MED ORDER — HEPARIN SOD (PORK) LOCK FLUSH 100 UNIT/ML IV SOLN
500.0000 [IU] | Freq: Once | INTRAVENOUS | Status: AC | PRN
  Administered 2014-07-29: 500 [IU]
  Filled 2014-07-29: qty 5

## 2014-07-29 MED ORDER — ONDANSETRON HCL 40 MG/20ML IJ SOLN
Freq: Once | INTRAMUSCULAR | Status: AC
  Administered 2014-07-29: 12:00:00 via INTRAVENOUS
  Filled 2014-07-29: qty 4

## 2014-07-29 MED ORDER — SODIUM CHLORIDE 0.9 % IJ SOLN
10.0000 mL | INTRAMUSCULAR | Status: DC | PRN
  Administered 2014-07-29: 10 mL
  Filled 2014-07-29: qty 10

## 2014-07-29 MED ORDER — SODIUM CHLORIDE 0.9 % IV SOLN
Freq: Once | INTRAVENOUS | Status: AC
  Administered 2014-07-29: 12:00:00 via INTRAVENOUS

## 2014-07-29 NOTE — Patient Instructions (Signed)
[facility] Discharge Instructions for Patients Receiving Chemotherapy  Today you received the following chemotherapy agents: Abraxane   To help prevent nausea and vomiting after your treatment, we encourage you to take your nausea medication as directed.    If you develop nausea and vomiting that is not controlled by your nausea medication, call the clinic.   BELOW ARE SYMPTOMS THAT SHOULD BE REPORTED IMMEDIATELY:  *FEVER GREATER THAN 100.5 F  *CHILLS WITH OR WITHOUT FEVER  NAUSEA AND VOMITING THAT IS NOT CONTROLLED WITH YOUR NAUSEA MEDICATION  *UNUSUAL SHORTNESS OF BREATH  *UNUSUAL BRUISING OR BLEEDING  TENDERNESS IN MOUTH AND THROAT WITH OR WITHOUT PRESENCE OF ULCERS  *URINARY PROBLEMS  *BOWEL PROBLEMS  UNUSUAL RASH Items with * indicate a potential emergency and should be followed up as soon as possible.  Feel free to call the clinic you have any questions or concerns. The clinic phone number is [phone].  Please show the CHEMO ALERT CARD at check-in to the Emergency Department and triage nurse.   

## 2014-08-04 ENCOUNTER — Ambulatory Visit (HOSPITAL_BASED_OUTPATIENT_CLINIC_OR_DEPARTMENT_OTHER): Payer: Medicare Other | Admitting: Hematology and Oncology

## 2014-08-04 ENCOUNTER — Other Ambulatory Visit (HOSPITAL_BASED_OUTPATIENT_CLINIC_OR_DEPARTMENT_OTHER): Payer: Medicare Other

## 2014-08-04 ENCOUNTER — Telehealth: Payer: Self-pay | Admitting: Hematology and Oncology

## 2014-08-04 VITALS — BP 114/51 | HR 72 | Temp 97.5°F | Resp 18 | Ht 66.0 in | Wt 191.1 lb

## 2014-08-04 DIAGNOSIS — D701 Agranulocytosis secondary to cancer chemotherapy: Secondary | ICD-10-CM | POA: Diagnosis not present

## 2014-08-04 DIAGNOSIS — R110 Nausea: Secondary | ICD-10-CM | POA: Diagnosis not present

## 2014-08-04 DIAGNOSIS — R5383 Other fatigue: Secondary | ICD-10-CM | POA: Diagnosis not present

## 2014-08-04 DIAGNOSIS — C50411 Malignant neoplasm of upper-outer quadrant of right female breast: Secondary | ICD-10-CM

## 2014-08-04 DIAGNOSIS — G620 Drug-induced polyneuropathy: Secondary | ICD-10-CM

## 2014-08-04 DIAGNOSIS — R609 Edema, unspecified: Secondary | ICD-10-CM | POA: Diagnosis not present

## 2014-08-04 DIAGNOSIS — C50811 Malignant neoplasm of overlapping sites of right female breast: Secondary | ICD-10-CM

## 2014-08-04 LAB — CBC WITH DIFFERENTIAL/PLATELET
BASO%: 0.5 % (ref 0.0–2.0)
Basophils Absolute: 0 10*3/uL (ref 0.0–0.1)
EOS%: 6.7 % (ref 0.0–7.0)
Eosinophils Absolute: 0.3 10*3/uL (ref 0.0–0.5)
HCT: 34.1 % — ABNORMAL LOW (ref 34.8–46.6)
HGB: 11.5 g/dL — ABNORMAL LOW (ref 11.6–15.9)
LYMPH%: 26.7 % (ref 14.0–49.7)
MCH: 29.9 pg (ref 25.1–34.0)
MCHC: 33.7 g/dL (ref 31.5–36.0)
MCV: 88.8 fL (ref 79.5–101.0)
MONO#: 0.4 10*3/uL (ref 0.1–0.9)
MONO%: 8.6 % (ref 0.0–14.0)
NEUT#: 2.4 10*3/uL (ref 1.5–6.5)
NEUT%: 57.5 % (ref 38.4–76.8)
Platelets: 276 10*3/uL (ref 145–400)
RBC: 3.84 10*6/uL (ref 3.70–5.45)
RDW: 15.4 % — ABNORMAL HIGH (ref 11.2–14.5)
WBC: 4.2 10*3/uL (ref 3.9–10.3)
lymph#: 1.1 10*3/uL (ref 0.9–3.3)

## 2014-08-04 LAB — COMPREHENSIVE METABOLIC PANEL (CC13)
ALK PHOS: 75 U/L (ref 40–150)
ALT: 18 U/L (ref 0–55)
ANION GAP: 8 meq/L (ref 3–11)
AST: 18 U/L (ref 5–34)
Albumin: 3.5 g/dL (ref 3.5–5.0)
BILIRUBIN TOTAL: 0.62 mg/dL (ref 0.20–1.20)
BUN: 7.8 mg/dL (ref 7.0–26.0)
CO2: 22 mEq/L (ref 22–29)
CREATININE: 0.7 mg/dL (ref 0.6–1.1)
Calcium: 9.1 mg/dL (ref 8.4–10.4)
Chloride: 108 mEq/L (ref 98–109)
EGFR: >90 mL/min/{1.73_m2} (ref >90)
GLUCOSE: 108 mg/dL (ref 70–140)
Potassium: 4 mEq/L (ref 3.5–5.1)
Sodium: 138 mEq/L (ref 136–145)
Total Protein: 6.4 g/dL (ref 6.4–8.3)

## 2014-08-04 MED ORDER — FUROSEMIDE 20 MG PO TABS: 20.0000 mg | ORAL_TABLET | Freq: Every day | ORAL | Status: DC

## 2014-08-04 NOTE — Telephone Encounter (Signed)
4/5 pof noted and chemo rescheduled,patient will get a new schedule at her 4/6

## 2014-08-04 NOTE — Assessment & Plan Note (Signed)
Right breast invasive ductal carcinoma, palpable mass, 5 cm by mammogram and 5.7 cm by MRI grade 3, ER PR HER-2 negative, associated skin dimpling, T3, N0, M0 stage IIB Ki-67 90%  Current treatment: Neoadjuvant chemotherapy with dose dense Adriamycin and Cytoxan 04/10/2014. Treatment Plan is for 4 cycles Adriamycin and Cytoxanfollowed by weekly Taxol x1 changed to Abraxane 11 from 06/17/2014; Tomorrow is week 9/12 of Abraxane  Chemotherapy-related toxicities: 1. Chemotherapy induced neutropenia grade 3: Chemotherapy dosage of AC was reduced for cycle 2 2. Nausea grade 1 3. Loss of taste 4. Decreased appetite  5. Bronchitis after cycle 2 treated with amoxicillin 6. Yeast infection: improved with Diflucan 7. Thrush in the throat: resolved with Diflucan 8. UTI: Resolved with Cipro 9. Fatigue: Seem to be cumulatively getting worse; Grade 2 10. Neuropathy in her feet grade 2 with just 1 dose of Taxol: Hence wechanged treatment to Abraxane. On Abraxane symptoms have improved. 11. Leg swelling: Prescribed Lasix Which led to improvement in leg swelling. Still has 1+ edema  Cardiomyopathy: S/P MRI heart. Stable. Patient received dexrazoxaneduring chemotherapy with Adriamycin. Echo 04/09/2014 showed ejection fraction 40-45%. I recommended continued follow-up with cardiology.  Monitoring closely for toxicities.  return to clinic in 2 weeks for follow-up

## 2014-08-04 NOTE — Progress Notes (Signed)
Patient Care Team: Stephen A Fry, MD as PCP - General Benjamin Hoxworth, MD as Consulting Physician (General Surgery) Vinay Gudena, MD as Consulting Physician (Hematology and Oncology) Robert Murray, MD as Consulting Physician (Radiation Oncology) Gretchen W Dawson, NP as Nurse Practitioner (Nurse Practitioner)  DIAGNOSIS: Breast cancer of upper-outer quadrant of right female breast   Staging form: Breast, AJCC 7th Edition     Clinical stage from 04/01/2014: Stage IIB (T3, N0, M0) - Unsigned       Staging comments: Staged at breast conference on 12.2.15    SUMMARY OF ONCOLOGIC HISTORY:   Breast cancer of upper-outer quadrant of right female breast   03/18/2014 Mammogram Right breast suspicious mass 10:00 4.9 x 3 x 5 cm, 2 lower right axillary lymph nodes mildly thickened cortices   03/23/2014 Initial Biopsy Right breast needle biopsy 9:00: Invasive ductal carcinoma grade 3, ER PR HER-2 negative ratio 1.3   03/31/2014 Breast MRI Right breast mass 9:00 upper outer quadrant with contiguous skin involvement by 5.7 cm, right axillary lymph nodes normal in size   04/10/2014 -  Neo-Adjuvant Chemotherapy Dose dense Adriamycin and Cytoxan 4 followed by weekly Taxol 12 after week 1 Taxol changed to Abraxane    CHIEF COMPLIANT: Tomorrow is cycle 9/12 of Abraxane  INTERVAL HISTORY: Stephanie Olsen is a 69-year-old lady with above-mentioned history of right-sided breast is currently neoadjuvant chemotherapy. She is tolerating Abraxane fairly well. Her next chemotherapy week 9 is scheduled for tomorrow. She reports slight worsening of neuropathy symptoms. Denies any nausea or vomiting. Has good energy levels and appears to be doing very well. She has decent taste and good appetite. Diminished energy levels over the past week  REVIEW OF SYSTEMS:   Constitutional: Denies fevers, chills or abnormal weight loss Eyes: Denies blurriness of vision Ears, nose, mouth, throat, and face: Denies mucositis or  sore throat Respiratory: Denies cough, dyspnea or wheezes Cardiovascular: Denies palpitation, chest discomfort or lower extremity swelling Gastrointestinal:  Denies nausea, heartburn or change in bowel habits Skin: Denies abnormal skin rashes Lymphatics: Denies new lymphadenopathy or easy bruising Neurological: Slight worsening neuropathy Behavioral/Psych: Mood is stable, no new changes  All other systems were reviewed with the patient and are negative.  I have reviewed the past medical history, past surgical history, social history and family history with the patient and they are unchanged from previous note.  ALLERGIES:  has No Known Allergies.  MEDICATIONS:  Current Outpatient Prescriptions  Medication Sig Dispense Refill  . Ascorbic Acid (VITAMIN C) 500 MG tablet Take 500 mg by mouth 2 (two) times daily.     . aspirin 81 MG tablet Take 81 mg by mouth every morning.     . beta carotene w/minerals (OCUVITE) tablet Take 1 tablet by mouth every morning.     . Calcium Carb-Cholecalciferol (CALCIUM 600/VITAMIN D3) 600-800 MG-UNIT TABS Take 1 tablet by mouth every morning.    . carvedilol (COREG) 3.125 MG tablet Take 1 tablet (3.125 mg total) by mouth 2 (two) times daily. 180 tablet 3  . cetirizine (ZYRTEC) 10 MG tablet Take 10 mg by mouth every morning.     . clonazePAM (KLONOPIN) 2 MG tablet Take 1 tablet (2 mg total) by mouth 2 (two) times daily as needed for anxiety. (Patient taking differently: Take 1 mg by mouth 2 (two) times daily as needed for anxiety. ) 180 tablet 1  . cyclobenzaprine (FLEXERIL) 10 MG tablet Take 1 tablet (10 mg total) by mouth 3 (three) times daily as   needed for muscle spasms. For spasms 90 tablet 5  . fish oil-omega-3 fatty acids 1000 MG capsule Take 1 g by mouth every morning.     . fluticasone (FLONASE) 50 MCG/ACT nasal spray INSTILL 2 SPRAYS INTO THE NOSE DAILY 48 g 1  . furosemide (LASIX) 20 MG tablet Take 1 tablet (20 mg total) by mouth daily. 30 tablet 1  .  HYDROcodone-acetaminophen (NORCO/VICODIN) 5-325 MG per tablet Take 1-2 tablets by mouth every 4 (four) hours as needed for moderate pain or severe pain. 25 tablet 0  . ibuprofen (ADVIL,MOTRIN) 200 MG tablet Take 200 mg by mouth every 6 (six) hours as needed for headache or mild pain.    . ketorolac (ACULAR) 0.5 % ophthalmic solution   0  . latanoprost (XALATAN) 0.005 % ophthalmic solution Place 1 drop into both eyes at bedtime.    . lisinopril (PRINIVIL,ZESTRIL) 10 MG tablet Take 1 tablet (10 mg total) by mouth daily. 90 tablet 3  . Magnesium 500 MG TABS Take 1 tablet by mouth at bedtime.    . metoCLOPramide (REGLAN) 10 MG tablet   5  . metroNIDAZOLE (METROGEL) 0.75 % gel Apply 1 application topically 2 (two) times daily as needed (rosacea on face.).     . nystatin (MYCOSTATIN) 100000 UNIT/ML suspension Take 5 mLs (500,000 Units total) by mouth 4 (four) times daily. 60 mL 0  . timolol (BETIMOL) 0.5 % ophthalmic solution Place 1 drop into both eyes every morning.    . UNABLE TO FIND Apply 1 each topically as needed. 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 1 each 1   No current facility-administered medications for this visit.   Facility-Administered Medications Ordered in Other Visits  Medication Dose Route Frequency Provider Last Rate Last Dose  . sodium chloride 0.9 % injection 10 mL  10 mL Intracatheter PRN Vinay Gudena, MD   10 mL at 06/17/14 1635    PHYSICAL EXAMINATION: ECOG PERFORMANCE STATUS: 1 - Symptomatic but completely ambulatory  Filed Vitals:   08/04/14 0930  BP: 114/51  Pulse: 72  Temp: 97.5 F (36.4 C)  Resp: 18   Filed Weights   08/04/14 0930  Weight: 191 lb 1.6 oz (86.682 kg)    GENERAL:alert, no distress and comfortable SKIN: skin color, texture, turgor are normal, no rashes or significant lesions EYES: normal, Conjunctiva are pink and non-injected, sclera clear OROPHARYNX:no exudate, no erythema and lips, buccal  mucosa, and tongue normal  NECK: supple, thyroid normal size, non-tender, without nodularity LYMPH:  no palpable lymphadenopathy in the cervical, axillary or inguinal LUNGS: clear to auscultation and percussion with normal breathing effort HEART: regular rate & rhythm and no murmurs and no lower extremity edema ABDOMEN:abdomen soft, non-tender and normal bowel sounds Musculoskeletal:no cyanosis of digits and no clubbing  NEURO: Grade 1-2 neuropathy LABORATORY DATA:  I have reviewed the data as listed   Chemistry      Component Value Date/Time   NA 140 07/29/2014 1103   NA 134* 02/12/2014 1108   K 4.0 07/29/2014 1103   K 4.7 02/12/2014 1108   CL 100 02/12/2014 1108   CO2 21* 07/29/2014 1103   CO2 25 02/12/2014 1108   BUN 6.9* 07/29/2014 1103   BUN 10 02/12/2014 1108   CREATININE 0.7 07/29/2014 1103   CREATININE 0.8 02/12/2014 1108      Component Value Date/Time   CALCIUM 8.9 07/29/2014 1103   CALCIUM 9.5 02/12/2014 1108   ALKPHOS 77 07/29/2014 1103     ALKPHOS 71 02/12/2014 1108   AST 17 07/29/2014 1103   AST 22 02/12/2014 1108   ALT 17 07/29/2014 1103   ALT 22 02/12/2014 1108   BILITOT 0.61 07/29/2014 1103   BILITOT 0.5 02/12/2014 1108       Lab Results  Component Value Date   WBC 4.2 08/04/2014   HGB 11.5* 08/04/2014   HCT 34.1* 08/04/2014   MCV 88.8 08/04/2014   PLT 276 08/04/2014   NEUTROABS 2.4 08/04/2014    ASSESSMENT & PLAN:  Breast cancer of upper-outer quadrant of right female breast Right breast invasive ductal carcinoma, palpable mass, 5 cm by mammogram and 5.7 cm by MRI grade 3, ER PR HER-2 negative, associated skin dimpling, T3, N0, M0 stage IIB Ki-67 90%  Current treatment: Neoadjuvant chemotherapy with dose dense Adriamycin and Cytoxan 04/10/2014. Treatment Plan is for 4 cycles Adriamycin and Cytoxanfollowed by weekly Taxol x1 changed to Abraxane 11 from 06/17/2014; Tomorrow is week 9/12 of Abraxane  Chemotherapy-related toxicities: 1.  Chemotherapy induced neutropenia grade 3: Chemotherapy dosage of AC was reduced for cycle 2 2. Nausea grade 1 3. Loss of taste 4. Decreased appetite  5. Bronchitis after cycle 2 treated with amoxicillin 6. Yeast infection: improved with Diflucan 7. Thrush in the throat: resolved with Diflucan 8. UTI: Resolved with Cipro 9. Fatigue: Seem to be cumulatively getting worse; Grade 2 10. Neuropathy in her feet grade 2 with just 1 dose of Taxol: Hence wechanged treatment to Abraxane. On Abraxane symptoms have improved. I decreased the dose of Abraxane 65 mg meter square with week 9 11. Leg swelling: Prescribed Lasix Which led to improvement in leg swelling. Still has 1+ edema  Cardiomyopathy: S/P MRI heart. Stable. Patient received dexrazoxaneduring chemotherapy with Adriamycin. Echo 04/09/2014 showed ejection fraction 40-45%. I recommended continued follow-up with cardiology.  Monitoring closely for toxicities.  return to clinic in 2 weeks for follow-up    No orders of the defined types were placed in this encounter.   The patient has a good understanding of the overall plan. she agrees with it. She will call with any problems that may develop before her next visit here.   Gudena, Vinay K, MD    

## 2014-08-05 ENCOUNTER — Ambulatory Visit (HOSPITAL_BASED_OUTPATIENT_CLINIC_OR_DEPARTMENT_OTHER): Payer: Medicare Other

## 2014-08-05 ENCOUNTER — Other Ambulatory Visit: Payer: Medicare Other

## 2014-08-05 DIAGNOSIS — Z5111 Encounter for antineoplastic chemotherapy: Secondary | ICD-10-CM

## 2014-08-05 DIAGNOSIS — C50811 Malignant neoplasm of overlapping sites of right female breast: Secondary | ICD-10-CM

## 2014-08-05 DIAGNOSIS — C50411 Malignant neoplasm of upper-outer quadrant of right female breast: Secondary | ICD-10-CM

## 2014-08-05 MED ORDER — SODIUM CHLORIDE 0.9 % IV SOLN
Freq: Once | INTRAVENOUS | Status: AC
  Administered 2014-08-05: 13:00:00 via INTRAVENOUS

## 2014-08-05 MED ORDER — HEPARIN SOD (PORK) LOCK FLUSH 100 UNIT/ML IV SOLN
500.0000 [IU] | Freq: Once | INTRAVENOUS | Status: AC | PRN
  Administered 2014-08-05: 500 [IU]
  Filled 2014-08-05: qty 5

## 2014-08-05 MED ORDER — SODIUM CHLORIDE 0.9 % IJ SOLN
10.0000 mL | INTRAMUSCULAR | Status: DC | PRN
  Administered 2014-08-05: 10 mL
  Filled 2014-08-05: qty 10

## 2014-08-05 MED ORDER — PACLITAXEL PROTEIN-BOUND CHEMO INJECTION 100 MG
65.0000 mg/m2 | Freq: Once | INTRAVENOUS | Status: AC
  Administered 2014-08-05: 125 mg via INTRAVENOUS
  Filled 2014-08-05: qty 25

## 2014-08-05 MED ORDER — ONDANSETRON HCL 40 MG/20ML IJ SOLN
Freq: Once | INTRAMUSCULAR | Status: AC
  Administered 2014-08-05: 13:00:00 via INTRAVENOUS
  Filled 2014-08-05: qty 4

## 2014-08-05 NOTE — Patient Instructions (Signed)
[facility] Discharge Instructions for Patients Receiving Chemotherapy  Today you received the following chemotherapy agents Abraxane.  To help prevent nausea and vomiting after your treatment, we encourage you to take your nausea medication Reglan 10mg  every 6 hours as needed  If you develop nausea and vomiting that is not controlled by your nausea medication, call the clinic.   BELOW ARE SYMPTOMS THAT SHOULD BE REPORTED IMMEDIATELY:  *FEVER GREATER THAN 100.5 F  *CHILLS WITH OR WITHOUT FEVER  NAUSEA AND VOMITING THAT IS NOT CONTROLLED WITH YOUR NAUSEA MEDICATION  *UNUSUAL SHORTNESS OF BREATH  *UNUSUAL BRUISING OR BLEEDING  TENDERNESS IN MOUTH AND THROAT WITH OR WITHOUT PRESENCE OF ULCERS  *URINARY PROBLEMS  *BOWEL PROBLEMS  UNUSUAL RASH Items with * indicate a potential emergency and should be followed up as soon as possible.  Feel free to call the clinic you have any questions or concerns. The clinic phone number is [phone].  Please show the [place] at check-in to the Emergency Department and triage nurse.

## 2014-08-10 DIAGNOSIS — Z961 Presence of intraocular lens: Secondary | ICD-10-CM | POA: Diagnosis not present

## 2014-08-10 DIAGNOSIS — H4011X2 Primary open-angle glaucoma, moderate stage: Secondary | ICD-10-CM | POA: Diagnosis not present

## 2014-08-10 DIAGNOSIS — H3531 Nonexudative age-related macular degeneration: Secondary | ICD-10-CM | POA: Diagnosis not present

## 2014-08-10 DIAGNOSIS — H02051 Trichiasis without entropian right upper eyelid: Secondary | ICD-10-CM | POA: Diagnosis not present

## 2014-08-12 ENCOUNTER — Ambulatory Visit (HOSPITAL_BASED_OUTPATIENT_CLINIC_OR_DEPARTMENT_OTHER): Payer: Medicare Other

## 2014-08-12 ENCOUNTER — Other Ambulatory Visit (HOSPITAL_BASED_OUTPATIENT_CLINIC_OR_DEPARTMENT_OTHER): Payer: Medicare Other

## 2014-08-12 VITALS — BP 123/50 | HR 69 | Temp 98.4°F

## 2014-08-12 DIAGNOSIS — C50811 Malignant neoplasm of overlapping sites of right female breast: Secondary | ICD-10-CM

## 2014-08-12 DIAGNOSIS — Z5111 Encounter for antineoplastic chemotherapy: Secondary | ICD-10-CM | POA: Diagnosis not present

## 2014-08-12 DIAGNOSIS — C50411 Malignant neoplasm of upper-outer quadrant of right female breast: Secondary | ICD-10-CM

## 2014-08-12 LAB — COMPREHENSIVE METABOLIC PANEL (CC13)
ALBUMIN: 3.5 g/dL (ref 3.5–5.0)
ALK PHOS: 76 U/L (ref 40–150)
ALT: 16 U/L (ref 0–55)
ANION GAP: 8 meq/L (ref 3–11)
AST: 19 U/L (ref 5–34)
BILIRUBIN TOTAL: 0.54 mg/dL (ref 0.20–1.20)
BUN: 7.8 mg/dL (ref 7.0–26.0)
CHLORIDE: 103 meq/L (ref 98–109)
CO2: 24 meq/L (ref 22–29)
Calcium: 8.9 mg/dL (ref 8.4–10.4)
Creatinine: 0.7 mg/dL (ref 0.6–1.1)
EGFR: 89 mL/min/{1.73_m2} — AB (ref >90)
GLUCOSE: 96 mg/dL (ref 70–140)
POTASSIUM: 3.9 meq/L (ref 3.5–5.1)
Sodium: 136 mEq/L (ref 136–145)
TOTAL PROTEIN: 6.6 g/dL (ref 6.4–8.3)

## 2014-08-12 LAB — CBC WITH DIFFERENTIAL/PLATELET
BASO%: 0.3 % (ref 0.0–2.0)
BASOS ABS: 0 10*3/uL (ref 0.0–0.1)
EOS ABS: 0.5 10*3/uL (ref 0.0–0.5)
EOS%: 8.3 % — ABNORMAL HIGH (ref 0.0–7.0)
HCT: 34.5 % — ABNORMAL LOW (ref 34.8–46.6)
HEMOGLOBIN: 11.7 g/dL (ref 11.6–15.9)
LYMPH#: 1.5 10*3/uL (ref 0.9–3.3)
LYMPH%: 24.2 % (ref 14.0–49.7)
MCH: 30.3 pg (ref 25.1–34.0)
MCHC: 33.9 g/dL (ref 31.5–36.0)
MCV: 89.4 fL (ref 79.5–101.0)
MONO#: 0.7 10*3/uL (ref 0.1–0.9)
MONO%: 11.6 % (ref 0.0–14.0)
NEUT#: 3.5 10*3/uL (ref 1.5–6.5)
NEUT%: 55.6 % (ref 38.4–76.8)
Platelets: 328 10*3/uL (ref 145–400)
RBC: 3.86 10*6/uL (ref 3.70–5.45)
RDW: 15.2 % — ABNORMAL HIGH (ref 11.2–14.5)
WBC: 6.3 10*3/uL (ref 3.9–10.3)

## 2014-08-12 MED ORDER — SODIUM CHLORIDE 0.9 % IJ SOLN
10.0000 mL | INTRAMUSCULAR | Status: DC | PRN
  Administered 2014-08-12: 10 mL
  Filled 2014-08-12: qty 10

## 2014-08-12 MED ORDER — HEPARIN SOD (PORK) LOCK FLUSH 100 UNIT/ML IV SOLN
500.0000 [IU] | Freq: Once | INTRAVENOUS | Status: AC | PRN
  Administered 2014-08-12: 500 [IU]
  Filled 2014-08-12: qty 5

## 2014-08-12 MED ORDER — ONDANSETRON HCL 40 MG/20ML IJ SOLN
Freq: Once | INTRAMUSCULAR | Status: AC
  Administered 2014-08-12: 13:00:00 via INTRAVENOUS
  Filled 2014-08-12: qty 4

## 2014-08-12 MED ORDER — SODIUM CHLORIDE 0.9 % IV SOLN
Freq: Once | INTRAVENOUS | Status: AC
  Administered 2014-08-12: 13:00:00 via INTRAVENOUS

## 2014-08-12 MED ORDER — PACLITAXEL PROTEIN-BOUND CHEMO INJECTION 100 MG
65.0000 mg/m2 | Freq: Once | INTRAVENOUS | Status: AC
  Administered 2014-08-12: 125 mg via INTRAVENOUS
  Filled 2014-08-12: qty 25

## 2014-08-12 NOTE — Patient Instructions (Signed)
[facility] Discharge Instructions for Patients Receiving Chemotherapy  Today you received the following chemotherapy agents abraxane.   To help prevent nausea and vomiting after your treatment, we encourage you to take your nausea medication as directed.   If you develop nausea and vomiting that is not controlled by your nausea medication, call the clinic.   BELOW ARE SYMPTOMS THAT SHOULD BE REPORTED IMMEDIATELY:  *FEVER GREATER THAN 100.5 F  *CHILLS WITH OR WITHOUT FEVER  NAUSEA AND VOMITING THAT IS NOT CONTROLLED WITH YOUR NAUSEA MEDICATION  *UNUSUAL SHORTNESS OF BREATH  *UNUSUAL BRUISING OR BLEEDING  TENDERNESS IN MOUTH AND THROAT WITH OR WITHOUT PRESENCE OF ULCERS  *URINARY PROBLEMS  *BOWEL PROBLEMS  UNUSUAL RASH Items with * indicate a potential emergency and should be followed up as soon as possible.  Feel free to call the clinic you have any questions or concerns. The clinic phone number is [phone].  

## 2014-08-12 NOTE — Progress Notes (Signed)
Pt c/o increased neuropathy, increased nausea, and increased edema in lower extremities since last treatment.  Selena Lesser, NP notified.  Per Selena Lesser via Dr Lindi Adie, ok to proceed with treatment.  Pt instructed to notify us if symptoms worsen after this weeks treatment.  Pt verbalized understanding.

## 2014-08-19 ENCOUNTER — Ambulatory Visit (HOSPITAL_BASED_OUTPATIENT_CLINIC_OR_DEPARTMENT_OTHER): Payer: Medicare Other

## 2014-08-19 ENCOUNTER — Other Ambulatory Visit: Payer: Medicare Other

## 2014-08-19 ENCOUNTER — Telehealth: Payer: Self-pay | Admitting: Hematology and Oncology

## 2014-08-19 ENCOUNTER — Other Ambulatory Visit (HOSPITAL_BASED_OUTPATIENT_CLINIC_OR_DEPARTMENT_OTHER): Payer: Medicare Other

## 2014-08-19 ENCOUNTER — Ambulatory Visit: Payer: Medicare Other | Admitting: Hematology and Oncology

## 2014-08-19 ENCOUNTER — Ambulatory Visit: Payer: Medicare Other | Admitting: Nurse Practitioner

## 2014-08-19 ENCOUNTER — Ambulatory Visit: Payer: Medicare Other

## 2014-08-19 ENCOUNTER — Ambulatory Visit (HOSPITAL_BASED_OUTPATIENT_CLINIC_OR_DEPARTMENT_OTHER): Payer: Medicare Other | Admitting: Hematology and Oncology

## 2014-08-19 VITALS — BP 133/72 | HR 80 | Temp 98.3°F | Resp 18 | Ht 66.0 in | Wt 193.0 lb

## 2014-08-19 DIAGNOSIS — G629 Polyneuropathy, unspecified: Secondary | ICD-10-CM

## 2014-08-19 DIAGNOSIS — C50811 Malignant neoplasm of overlapping sites of right female breast: Secondary | ICD-10-CM

## 2014-08-19 DIAGNOSIS — R5383 Other fatigue: Secondary | ICD-10-CM

## 2014-08-19 DIAGNOSIS — Z5111 Encounter for antineoplastic chemotherapy: Secondary | ICD-10-CM | POA: Diagnosis not present

## 2014-08-19 DIAGNOSIS — C50411 Malignant neoplasm of upper-outer quadrant of right female breast: Secondary | ICD-10-CM

## 2014-08-19 DIAGNOSIS — M7989 Other specified soft tissue disorders: Secondary | ICD-10-CM | POA: Diagnosis not present

## 2014-08-19 DIAGNOSIS — D701 Agranulocytosis secondary to cancer chemotherapy: Secondary | ICD-10-CM

## 2014-08-19 DIAGNOSIS — R110 Nausea: Secondary | ICD-10-CM | POA: Diagnosis not present

## 2014-08-19 LAB — COMPREHENSIVE METABOLIC PANEL (CC13)
ALK PHOS: 69 U/L (ref 40–150)
ALT: 16 U/L (ref 0–55)
ANION GAP: 14 meq/L — AB (ref 3–11)
AST: 17 U/L (ref 5–34)
Albumin: 3.4 g/dL — ABNORMAL LOW (ref 3.5–5.0)
BUN: 7.9 mg/dL (ref 7.0–26.0)
CO2: 20 mEq/L — ABNORMAL LOW (ref 22–29)
Calcium: 9.1 mg/dL (ref 8.4–10.4)
Chloride: 107 mEq/L (ref 98–109)
Creatinine: 0.7 mg/dL (ref 0.6–1.1)
GLUCOSE: 104 mg/dL (ref 70–140)
POTASSIUM: 3.9 meq/L (ref 3.5–5.1)
SODIUM: 141 meq/L (ref 136–145)
Total Bilirubin: 0.35 mg/dL (ref 0.20–1.20)
Total Protein: 6.4 g/dL (ref 6.4–8.3)

## 2014-08-19 LAB — CBC WITH DIFFERENTIAL/PLATELET
BASO%: 0.4 % (ref 0.0–2.0)
Basophils Absolute: 0 10*3/uL (ref 0.0–0.1)
EOS ABS: 0.4 10*3/uL (ref 0.0–0.5)
EOS%: 7.1 % — ABNORMAL HIGH (ref 0.0–7.0)
HCT: 35.3 % (ref 34.8–46.6)
HEMOGLOBIN: 11.7 g/dL (ref 11.6–15.9)
LYMPH%: 21.1 % (ref 14.0–49.7)
MCH: 29.5 pg (ref 25.1–34.0)
MCHC: 33.1 g/dL (ref 31.5–36.0)
MCV: 88.9 fL (ref 79.5–101.0)
MONO#: 0.4 10*3/uL (ref 0.1–0.9)
MONO%: 8.5 % (ref 0.0–14.0)
NEUT%: 62.9 % (ref 38.4–76.8)
NEUTROS ABS: 3.1 10*3/uL (ref 1.5–6.5)
Platelets: 311 10*3/uL (ref 145–400)
RBC: 3.97 10*6/uL (ref 3.70–5.45)
RDW: 15.2 % — AB (ref 11.2–14.5)
WBC: 4.9 10*3/uL (ref 3.9–10.3)
lymph#: 1 10*3/uL (ref 0.9–3.3)

## 2014-08-19 MED ORDER — SODIUM CHLORIDE 0.9 % IJ SOLN
10.0000 mL | INTRAMUSCULAR | Status: DC | PRN
  Administered 2014-08-19: 10 mL
  Filled 2014-08-19: qty 10

## 2014-08-19 MED ORDER — PACLITAXEL PROTEIN-BOUND CHEMO INJECTION 100 MG
65.0000 mg/m2 | Freq: Once | INTRAVENOUS | Status: AC
  Administered 2014-08-19: 125 mg via INTRAVENOUS
  Filled 2014-08-19: qty 25

## 2014-08-19 MED ORDER — SODIUM CHLORIDE 0.9 % IV SOLN
Freq: Once | INTRAVENOUS | Status: AC
  Administered 2014-08-19: 12:00:00 via INTRAVENOUS

## 2014-08-19 MED ORDER — HEPARIN SOD (PORK) LOCK FLUSH 100 UNIT/ML IV SOLN
500.0000 [IU] | Freq: Once | INTRAVENOUS | Status: AC | PRN
  Administered 2014-08-19: 500 [IU]
  Filled 2014-08-19: qty 5

## 2014-08-19 MED ORDER — ONDANSETRON HCL 40 MG/20ML IJ SOLN
Freq: Once | INTRAMUSCULAR | Status: AC
  Administered 2014-08-19: 12:00:00 via INTRAVENOUS
  Filled 2014-08-19: qty 4

## 2014-08-19 NOTE — Patient Instructions (Signed)
[facility] Discharge Instructions for Patients Receiving Chemotherapy  Today you received the following chemotherapy agents Abraxane.  To help prevent nausea and vomiting after your treatment, we encourage you to take your nausea medication Reglan 10 mg every 6 hours as needed.   If you develop nausea and vomiting that is not controlled by your nausea medication, call the clinic.   BELOW ARE SYMPTOMS THAT SHOULD BE REPORTED IMMEDIATELY:  *FEVER GREATER THAN 100.5 F  *CHILLS WITH OR WITHOUT FEVER  NAUSEA AND VOMITING THAT IS NOT CONTROLLED WITH YOUR NAUSEA MEDICATION  *UNUSUAL SHORTNESS OF BREATH  *UNUSUAL BRUISING OR BLEEDING  TENDERNESS IN MOUTH AND THROAT WITH OR WITHOUT PRESENCE OF ULCERS  *URINARY PROBLEMS  *BOWEL PROBLEMS  UNUSUAL RASH Items with * indicate a potential emergency and should be followed up as soon as possible.  Feel free to call the clinic you have any questions or concerns. The clinic phone number is [phone].  Please show the [facility] at check-in to the Emergency Department and triage nurse.

## 2014-08-19 NOTE — Telephone Encounter (Signed)
Appointments made,patient advised to call for mri and avs pritned

## 2014-08-19 NOTE — Progress Notes (Signed)
Patient Care Team: Laurey Morale, MD as PCP - General Excell Seltzer, MD as Consulting Physician (General Surgery) Nicholas Lose, MD as Consulting Physician (Hematology and Oncology) Arloa Koh, MD as Consulting Physician (Radiation Oncology) Holley Bouche, NP as Nurse Practitioner (Nurse Practitioner)  DIAGNOSIS: Breast cancer of upper-outer quadrant of right female breast   Staging form: Breast, AJCC 7th Edition     Clinical stage from 04/01/2014: Stage IIB (T3, N0, M0) - Unsigned       Staging comments: Staged at breast conference on 12.2.15    SUMMARY OF ONCOLOGIC HISTORY:   Breast cancer of upper-outer quadrant of right female breast   03/18/2014 Mammogram Right breast suspicious mass 10:00 4.9 x 3 x 5 cm, 2 lower right axillary lymph nodes mildly thickened cortices   03/23/2014 Initial Biopsy Right breast needle biopsy 9:00: Invasive ductal carcinoma grade 3, ER PR HER-2 negative ratio 1.3   03/31/2014 Breast MRI Right breast mass 9:00 upper outer quadrant with contiguous skin involvement by 5.7 cm, right axillary lymph nodes normal in size   04/10/2014 -  Neo-Adjuvant Chemotherapy Dose dense Adriamycin and Cytoxan 4 followed by weekly Taxol 12 after week 1 Taxol changed to Abraxane    CHIEF COMPLIANT: Abraxane week 11/12  INTERVAL HISTORY: Stephanie Olsen is a 69 year old lady with above-mentioned history of right-sided breast cancer on neoadjuvant chemotherapy. She is on week 11 of Abraxane. She was feeling fatigued and was complaining of headaches with week 10 chemotherapy but we persisted and continued on with chemotherapy and she did not have any further problems since then. Today she actually feels very well without any major problems or concerns. Denies any tingling or numbness of her hands or feet. She does complain of leg swelling.  REVIEW OF SYSTEMS:   Constitutional: Denies fevers, chills or abnormal weight loss Eyes: Denies blurriness of vision Ears, nose,  mouth, throat, and face: Denies mucositis or sore throat Respiratory: Denies cough, dyspnea or wheezes Cardiovascular: Denies palpitation, chest discomfort or lower extremity swelling Gastrointestinal:  Denies nausea, heartburn or change in bowel habits Skin: Denies abnormal skin rashes Lymphatics: Denies new lymphadenopathy or easy bruising Neurological:Denies numbness, tingling or new weaknesses Behavioral/Psych: Mood is stable, no new changes   All other systems were reviewed with the patient and are negative.  I have reviewed the past medical history, past surgical history, social history and family history with the patient and they are unchanged from previous note.  ALLERGIES:  has No Known Allergies.  MEDICATIONS:  Current Outpatient Prescriptions  Medication Sig Dispense Refill  . Ascorbic Acid (VITAMIN C) 500 MG tablet Take 500 mg by mouth 2 (two) times daily.     Marland Kitchen aspirin 81 MG tablet Take 81 mg by mouth every morning.     . beta carotene w/minerals (OCUVITE) tablet Take 1 tablet by mouth every morning.     . Calcium Carb-Cholecalciferol (CALCIUM 600/VITAMIN D3) 600-800 MG-UNIT TABS Take 1 tablet by mouth every morning.    . carvedilol (COREG) 3.125 MG tablet Take 1 tablet (3.125 mg total) by mouth 2 (two) times daily. 180 tablet 3  . cetirizine (ZYRTEC) 10 MG tablet Take 10 mg by mouth every morning.     . clonazePAM (KLONOPIN) 2 MG tablet Take 1 tablet (2 mg total) by mouth 2 (two) times daily as needed for anxiety. (Patient taking differently: Take 1 mg by mouth 2 (two) times daily as needed for anxiety. ) 180 tablet 1  . cyclobenzaprine (FLEXERIL) 10 MG  tablet Take 1 tablet (10 mg total) by mouth 3 (three) times daily as needed for muscle spasms. For spasms 90 tablet 5  . fish oil-omega-3 fatty acids 1000 MG capsule Take 1 g by mouth every morning.     . fluticasone (FLONASE) 50 MCG/ACT nasal spray INSTILL 2 SPRAYS INTO THE NOSE DAILY 48 g 1  . furosemide (LASIX) 20 MG  tablet Take 1 tablet (20 mg total) by mouth daily. 30 tablet 1  . HYDROcodone-acetaminophen (NORCO/VICODIN) 5-325 MG per tablet Take 1-2 tablets by mouth every 4 (four) hours as needed for moderate pain or severe pain. 25 tablet 0  . ibuprofen (ADVIL,MOTRIN) 200 MG tablet Take 200 mg by mouth every 6 (six) hours as needed for headache or mild pain.    Marland Kitchen ketorolac (ACULAR) 0.5 % ophthalmic solution   0  . latanoprost (XALATAN) 0.005 % ophthalmic solution Place 1 drop into both eyes at bedtime.    Marland Kitchen lisinopril (PRINIVIL,ZESTRIL) 10 MG tablet Take 1 tablet (10 mg total) by mouth daily. 90 tablet 3  . Magnesium 500 MG TABS Take 1 tablet by mouth at bedtime.    . metoCLOPramide (REGLAN) 10 MG tablet   5  . metroNIDAZOLE (METROGEL) 0.75 % gel Apply 1 application topically 2 (two) times daily as needed (rosacea on face.).     Marland Kitchen nystatin (MYCOSTATIN) 100000 UNIT/ML suspension Take 5 mLs (500,000 Units total) by mouth 4 (four) times daily. 60 mL 0  . timolol (BETIMOL) 0.5 % ophthalmic solution Place 1 drop into both eyes every morning.    Marland Kitchen UNABLE TO FIND Apply 1 each topically as needed. 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 1 each 1   No current facility-administered medications for this visit.   Facility-Administered Medications Ordered in Other Visits  Medication Dose Route Frequency Provider Last Rate Last Dose  . sodium chloride 0.9 % injection 10 mL  10 mL Intracatheter PRN Nicholas Lose, MD   10 mL at 06/17/14 1635  . sodium chloride 0.9 % injection 10 mL  10 mL Intracatheter PRN Nicholas Lose, MD   10 mL at 08/19/14 1320    PHYSICAL EXAMINATION: ECOG PERFORMANCE STATUS: 1 - Symptomatic but completely ambulatory  Filed Vitals:   08/19/14 1005  BP: 133/72  Pulse: 80  Temp: 98.3 F (36.8 C)  Resp: 18   Filed Weights   08/19/14 1005  Weight: 193 lb (87.544 kg)    GENERAL:alert, no distress and comfortable SKIN: skin color, texture,  turgor are normal, no rashes or significant lesions EYES: normal, Conjunctiva are pink and non-injected, sclera clear OROPHARYNX:no exudate, no erythema and lips, buccal mucosa, and tongue normal  NECK: supple, thyroid normal size, non-tender, without nodularity LYMPH:  no palpable lymphadenopathy in the cervical, axillary or inguinal LUNGS: clear to auscultation and percussion with normal breathing effort HEART: regular rate & rhythm and no murmurs and 1+ lower extremity edema ABDOMEN:abdomen soft, non-tender and normal bowel sounds Musculoskeletal:no cyanosis of digits and no clubbing  NEURO: alert & oriented x 3 with fluent speech, no focal motor/sensory deficits   LABORATORY DATA:  I have reviewed the data as listed   Chemistry      Component Value Date/Time   NA 141 08/19/2014 0944   NA 134* 02/12/2014 1108   K 3.9 08/19/2014 0944   K 4.7 02/12/2014 1108   CL 100 02/12/2014 1108   CO2 20* 08/19/2014 0944   CO2 25 02/12/2014 1108   BUN 7.9  08/19/2014 0944   BUN 10 02/12/2014 1108   CREATININE 0.7 08/19/2014 0944   CREATININE 0.8 02/12/2014 1108      Component Value Date/Time   CALCIUM 9.1 08/19/2014 0944   CALCIUM 9.5 02/12/2014 1108   ALKPHOS 69 08/19/2014 0944   ALKPHOS 71 02/12/2014 1108   AST 17 08/19/2014 0944   AST 22 02/12/2014 1108   ALT 16 08/19/2014 0944   ALT 22 02/12/2014 1108   BILITOT 0.35 08/19/2014 0944   BILITOT 0.5 02/12/2014 1108       Lab Results  Component Value Date   WBC 4.9 08/19/2014   HGB 11.7 08/19/2014   HCT 35.3 08/19/2014   MCV 88.9 08/19/2014   PLT 311 08/19/2014   NEUTROABS 3.1 08/19/2014    ASSESSMENT & PLAN:  Breast cancer of upper-outer quadrant of right female breast Right breast invasive ductal carcinoma, palpable mass, 5 cm by mammogram and 5.7 cm by MRI grade 3, ER PR HER-2 negative, associated skin dimpling, T3, N0, M0 stage IIB Ki-67 90%  Current treatment: Neoadjuvant chemotherapy with dose dense Adriamycin and  Cytoxan 04/10/2014. Treatment Plan is for 4 cycles Adriamycin and Cytoxanfollowed by weekly Taxol x1 changed to Abraxane 11 from 06/17/2014; today is week 11/12 of Abraxane  Chemotherapy-related toxicities: 1. Chemotherapy induced neutropenia grade 3: Chemotherapy dosage of AC was reduced for cycle 2 2. Nausea grade 1 3. Loss of taste 4. Decreased appetite  5. Bronchitis after cycle 2 treated with amoxicillin 6. Yeast infection: improved with Diflucan 7. Thrush in the throat: resolved with Diflucan 8. UTI: Resolved with Cipro 9. Fatigue: Seem to be cumulatively getting worse; Grade 2 10. Neuropathy in her feet grade 2 with just 1 dose of Taxol: Hence wechanged treatment to Abraxane. On Abraxane symptoms have improved. I decreased the dose of Abraxane 65 mg meter square with week 9 11. Leg swelling: Prescribed Lasix Which led to improvement in leg swelling. Still has 1+ edema  Cardiomyopathy: S/P MRI heart. Stable. Patient received dexrazoxaneduring chemotherapy with Adriamycin. Echo 04/09/2014 showed ejection fraction 40-45%. I recommended continued follow-up with cardiology.  Monitoring closely for toxicities.  return to clinic in 2 weeks for follow-up    Orders Placed This Encounter  Procedures  . MR Breast Bilateral Wo Contrast    Standing Status: Future     Number of Occurrences:      Standing Expiration Date: 08/19/2015    Order Specific Question:  Reason for Exam (SYMPTOM  OR DIAGNOSIS REQUIRED)    Answer:  Post neoadjuvant chemotherapy breast MRI    Order Specific Question:  Preferred imaging location?    Answer:  [facility]    Order Specific Question:  Does the patient have a pacemaker or implanted devices?    Answer:  No    Order Specific Question:  What is the patient's sedation requirement?    Answer:  No Sedation  . MR Breast Bilateral W Contrast    Standing Status: Future     Number of Occurrences:      Standing Expiration Date: 08/19/2015     Order Specific Question:  Reason for Exam (SYMPTOM  OR DIAGNOSIS REQUIRED)    Answer:  Post neoadjuvant chemotherapy breast MRI    Order Specific Question:  Preferred imaging location?    Answer:  [facility]    Order Specific Question:  Does the patient have a pacemaker or implanted devices?    Answer:  No    Order Specific Question:  What is  the patient's sedation requirement?    Answer:  No Sedation   The patient has a good understanding of the overall plan. she agrees with it. She will call with any problems that may develop before her next visit here.   Rulon Eisenmenger, MD

## 2014-08-19 NOTE — Assessment & Plan Note (Addendum)
Right breast invasive ductal carcinoma, palpable mass, 5 cm by mammogram and 5.7 cm by MRI grade 3, ER PR HER-2 negative, associated skin dimpling, T3, N0, M0 stage IIB Ki-67 90%  Current treatment: Neoadjuvant chemotherapy with dose dense Adriamycin and Cytoxan 04/10/2014. Treatment Plan is for 4 cycles Adriamycin and Cytoxanfollowed by weekly Taxol x1 changed to Abraxane 11 from 06/17/2014; today is week 11/12 of Abraxane  Chemotherapy-related toxicities: 1. Chemotherapy induced neutropenia grade 3: Chemotherapy dosage of AC was reduced for cycle 2 2. Nausea grade 1 3. Loss of taste 4. Decreased appetite  5. Bronchitis after cycle 2 treated with amoxicillin 6. Yeast infection: improved with Diflucan 7. Thrush in the throat: resolved with Diflucan 8. UTI: Resolved with Cipro 9. Fatigue: Seem to be cumulatively getting worse; Grade 2 10. Neuropathy in her feet grade 2 with just 1 dose of Taxol: Hence wechanged treatment to Abraxane. On Abraxane symptoms have improved. I decreased the dose of Abraxane 65 mg meter square with week 9 11. Leg swelling: Prescribed Lasix Which led to improvement in leg swelling. Still has 1+ edema  Cardiomyopathy: S/P MRI heart. Stable. Patient received dexrazoxaneduring chemotherapy with Adriamycin. Echo 04/09/2014 showed ejection fraction 40-45%. I recommended continued follow-up with cardiology.  Monitoring closely for toxicities.  return to clinic in 2 weeks for follow-up

## 2014-08-24 ENCOUNTER — Other Ambulatory Visit: Payer: Self-pay | Admitting: *Deleted

## 2014-08-25 ENCOUNTER — Other Ambulatory Visit: Payer: Self-pay | Admitting: *Deleted

## 2014-08-26 ENCOUNTER — Other Ambulatory Visit: Payer: Medicare Other

## 2014-08-26 ENCOUNTER — Ambulatory Visit: Payer: Medicare Other

## 2014-08-26 ENCOUNTER — Ambulatory Visit: Payer: Medicare Other | Admitting: Hematology and Oncology

## 2014-08-26 ENCOUNTER — Ambulatory Visit (HOSPITAL_BASED_OUTPATIENT_CLINIC_OR_DEPARTMENT_OTHER): Payer: Medicare Other

## 2014-08-26 ENCOUNTER — Other Ambulatory Visit (HOSPITAL_BASED_OUTPATIENT_CLINIC_OR_DEPARTMENT_OTHER): Payer: Medicare Other

## 2014-08-26 DIAGNOSIS — C50811 Malignant neoplasm of overlapping sites of right female breast: Secondary | ICD-10-CM

## 2014-08-26 DIAGNOSIS — Z5111 Encounter for antineoplastic chemotherapy: Secondary | ICD-10-CM

## 2014-08-26 DIAGNOSIS — C50411 Malignant neoplasm of upper-outer quadrant of right female breast: Secondary | ICD-10-CM

## 2014-08-26 LAB — COMPREHENSIVE METABOLIC PANEL (CC13)
ALBUMIN: 3.3 g/dL — AB (ref 3.5–5.0)
ALK PHOS: 69 U/L (ref 40–150)
ALT: 13 U/L (ref 0–55)
AST: 16 U/L (ref 5–34)
Anion Gap: 11 mEq/L (ref 3–11)
BUN: 9.8 mg/dL (ref 7.0–26.0)
CO2: 19 mEq/L — ABNORMAL LOW (ref 22–29)
Calcium: 9.3 mg/dL (ref 8.4–10.4)
Chloride: 107 mEq/L (ref 98–109)
Creatinine: 0.7 mg/dL (ref 0.6–1.1)
GLUCOSE: 95 mg/dL (ref 70–140)
POTASSIUM: 4 meq/L (ref 3.5–5.1)
Sodium: 137 mEq/L (ref 136–145)
Total Bilirubin: 0.38 mg/dL (ref 0.20–1.20)
Total Protein: 6.2 g/dL — ABNORMAL LOW (ref 6.4–8.3)

## 2014-08-26 LAB — CBC WITH DIFFERENTIAL/PLATELET
BASO%: 0.3 % (ref 0.0–2.0)
BASOS ABS: 0 10*3/uL (ref 0.0–0.1)
EOS%: 6.3 % (ref 0.0–7.0)
Eosinophils Absolute: 0.3 10*3/uL (ref 0.0–0.5)
HEMATOCRIT: 35.1 % (ref 34.8–46.6)
HEMOGLOBIN: 11.6 g/dL (ref 11.6–15.9)
LYMPH#: 1 10*3/uL (ref 0.9–3.3)
LYMPH%: 19.8 % (ref 14.0–49.7)
MCH: 29.5 pg (ref 25.1–34.0)
MCHC: 33.1 g/dL (ref 31.5–36.0)
MCV: 89 fL (ref 79.5–101.0)
MONO#: 0.6 10*3/uL (ref 0.1–0.9)
MONO%: 11.5 % (ref 0.0–14.0)
NEUT#: 3.2 10*3/uL (ref 1.5–6.5)
NEUT%: 62.1 % (ref 38.4–76.8)
Platelets: 328 10*3/uL (ref 145–400)
RBC: 3.94 10*6/uL (ref 3.70–5.45)
RDW: 16.5 % — ABNORMAL HIGH (ref 11.2–14.5)
WBC: 5.1 10*3/uL (ref 3.9–10.3)

## 2014-08-26 MED ORDER — PACLITAXEL PROTEIN-BOUND CHEMO INJECTION 100 MG
65.0000 mg/m2 | Freq: Once | INTRAVENOUS | Status: AC
  Administered 2014-08-26: 125 mg via INTRAVENOUS
  Filled 2014-08-26: qty 25

## 2014-08-26 MED ORDER — SODIUM CHLORIDE 0.9 % IV SOLN
Freq: Once | INTRAVENOUS | Status: AC
  Administered 2014-08-26: 10:00:00 via INTRAVENOUS

## 2014-08-26 MED ORDER — ONDANSETRON HCL 40 MG/20ML IJ SOLN
Freq: Once | INTRAMUSCULAR | Status: AC
  Administered 2014-08-26: 10:00:00 via INTRAVENOUS
  Filled 2014-08-26: qty 4

## 2014-08-26 MED ORDER — HEPARIN SOD (PORK) LOCK FLUSH 100 UNIT/ML IV SOLN
500.0000 [IU] | Freq: Once | INTRAVENOUS | Status: AC | PRN
  Administered 2014-08-26: 500 [IU]
  Filled 2014-08-26: qty 5

## 2014-08-26 MED ORDER — SODIUM CHLORIDE 0.9 % IJ SOLN
10.0000 mL | INTRAMUSCULAR | Status: DC | PRN
  Administered 2014-08-26: 10 mL
  Filled 2014-08-26: qty 10

## 2014-08-26 NOTE — Patient Instructions (Signed)
[facility] Discharge Instructions for Patients Receiving Chemotherapy  Today you received the following chemotherapy agents Abraxane.  To help prevent nausea and vomiting after your treatment, we encourage you to take your nausea medication Reglan 10 mg every 6 hours as needed.   If you develop nausea and vomiting that is not controlled by your nausea medication, call the clinic.   BELOW ARE SYMPTOMS THAT SHOULD BE REPORTED IMMEDIATELY:  *FEVER GREATER THAN 100.5 F  *CHILLS WITH OR WITHOUT FEVER  NAUSEA AND VOMITING THAT IS NOT CONTROLLED WITH YOUR NAUSEA MEDICATION  *UNUSUAL SHORTNESS OF BREATH  *UNUSUAL BRUISING OR BLEEDING  TENDERNESS IN MOUTH AND THROAT WITH OR WITHOUT PRESENCE OF ULCERS  *URINARY PROBLEMS  *BOWEL PROBLEMS  UNUSUAL RASH Items with * indicate a potential emergency and should be followed up as soon as possible.  Feel free to call the clinic you have any questions or concerns. The clinic phone number is [phone].  Please show the [facility] at check-in to the Emergency Department and triage nurse.

## 2014-08-28 ENCOUNTER — Ambulatory Visit
Admission: RE | Admit: 2014-08-28 | Discharge: 2014-08-28 | Disposition: A | Discharge status: Home / Self Care | Payer: Medicare Other | Source: Ambulatory Visit | Attending: Hematology and Oncology | Admitting: Hematology and Oncology

## 2014-08-28 DIAGNOSIS — C50411 Malignant neoplasm of upper-outer quadrant of right female breast: Secondary | ICD-10-CM | POA: Diagnosis not present

## 2014-08-28 MED ORDER — GADOBENATE DIMEGLUMINE 529 MG/ML IV SOLN
17.0000 mL | Freq: Once | INTRAVENOUS | Status: AC | PRN
  Administered 2014-08-28: 17 mL via INTRAVENOUS

## 2014-09-02 ENCOUNTER — Ambulatory Visit (HOSPITAL_BASED_OUTPATIENT_CLINIC_OR_DEPARTMENT_OTHER): Payer: Medicare Other | Admitting: Hematology and Oncology

## 2014-09-02 VITALS — BP 123/76 | HR 80 | Temp 98.0°F | Resp 18 | Ht 66.0 in | Wt 190.6 lb

## 2014-09-02 DIAGNOSIS — R600 Localized edema: Secondary | ICD-10-CM | POA: Diagnosis not present

## 2014-09-02 DIAGNOSIS — C50811 Malignant neoplasm of overlapping sites of right female breast: Secondary | ICD-10-CM

## 2014-09-02 DIAGNOSIS — D709 Neutropenia, unspecified: Secondary | ICD-10-CM

## 2014-09-02 DIAGNOSIS — C50411 Malignant neoplasm of upper-outer quadrant of right female breast: Secondary | ICD-10-CM

## 2014-09-02 DIAGNOSIS — G629 Polyneuropathy, unspecified: Secondary | ICD-10-CM

## 2014-09-02 DIAGNOSIS — R110 Nausea: Secondary | ICD-10-CM

## 2014-09-02 NOTE — Assessment & Plan Note (Signed)
Right breast invasive ductal carcinoma, palpable mass, 5 cm by mammogram and 5.7 cm by MRI grade 3, ER PR HER-2 negative, associated skin dimpling, T3, N0, M0 stage IIB Ki-67 90%  Chemotherapy summary: Neoadjuvant chemotherapy with dose dense Adriamycin and Cytoxan 04/10/2014. followed by weekly Taxol x1 changed to Abraxane 11 from 06/17/2014 completed 08/26/2014 Chemotherapy toxicities: Neutropenia, nausea grade 1, loss of taste, decreased appetite, yeast infection, oral thrush, UTI, fatigue, neuropathy grade 2, leg edema. Radiology review: Breast MRI done following neoadjuvant chemotherapy revealed excellent response to treatment with decrease in size from 5.7 cm to 8 mm. Decrease in size of the axillary lymph nodes. Skin changes have also resolved.  Recommendation based multidisciplinary tumor board: 1. Punch biopsy of the skin before proceeding with surgery 2. Lumpectomy with sentinel lymph node biopsy and axillary staging 3. Followed by radiation therapy  Patient to return back to see [place] after undergoing surgery. Cardiomyopathy: Follows with cardiology.

## 2014-09-02 NOTE — Progress Notes (Signed)
Patient Care Team: Laurey Morale, MD as PCP - General Excell Seltzer, MD as Consulting Physician (General Surgery) Nicholas Lose, MD as Consulting Physician (Hematology and Oncology) Arloa Koh, MD as Consulting Physician (Radiation Oncology) Holley Bouche, NP as Nurse Practitioner (Nurse Practitioner)  DIAGNOSIS: Breast cancer of upper-outer quadrant of right female breast   Staging form: Breast, AJCC 7th Edition     Clinical stage from 04/01/2014: Stage IIB (T3, N0, M0) - Unsigned       Staging comments: Staged at breast conference on 12.2.15    SUMMARY OF ONCOLOGIC HISTORY:   Breast cancer of upper-outer quadrant of right female breast   03/18/2014 Mammogram Right breast suspicious mass 10:00 4.9 x 3 x 5 cm, 2 lower right axillary lymph nodes mildly thickened cortices   03/23/2014 Initial Biopsy Right breast needle biopsy 9:00: Invasive ductal carcinoma grade 3, ER PR HER-2 negative ratio 1.3   03/31/2014 Breast MRI Right breast mass 9:00 upper outer quadrant with contiguous skin involvement by 5.7 cm, right axillary lymph nodes normal in size   04/10/2014 - 08/26/2014 Neo-Adjuvant Chemotherapy Dose dense Adriamycin and Cytoxan 4 followed by weekly Taxol 12 after week 1 Taxol changed to Abraxane   08/28/2014 Breast MRI Decrease in the size of the right breast mass from 5.7 cm to 8 mm, skin enhancement is no longer seen, decreased in size of axillary lymph nodes, excellent response to chemotherapy    CHIEF COMPLIANT: Follow-up after breast MRI  INTERVAL HISTORY: Stephanie Olsen is a 69 year old with above-mentioned history of right breast cancer treated with neoadjuvant chemotherapy and is here after undergoing a breast MRI to discuss the results. She was present this morning at the multidisciplinary tumor board. She is expecting her grandchild to be bone this Friday. She has appointment with Dr. Excell Seltzer next week.  REVIEW OF SYSTEMS:   Constitutional: Denies fevers, chills  or abnormal weight loss Eyes: Denies blurriness of vision Ears, nose, mouth, throat, and face: Denies mucositis or sore throat Respiratory: Denies cough, dyspnea or wheezes Cardiovascular: Denies palpitation, chest discomfort or lower extremity swelling Gastrointestinal:  Denies nausea, heartburn or change in bowel habits Skin: Denies abnormal skin rashes Lymphatics: Denies new lymphadenopathy or easy bruising Neurological:Denies numbness, tingling or new weaknesses Behavioral/Psych: Mood is stable, no new changes  Breast:  denies any pain or lumps or nodules in either breasts All other systems were reviewed with the patient and are negative.  I have reviewed the past medical history, past surgical history, social history and family history with the patient and they are unchanged from previous note.  ALLERGIES:  has No Known Allergies.  MEDICATIONS:  Current Outpatient Prescriptions  Medication Sig Dispense Refill  . Ascorbic Acid (VITAMIN C) 500 MG tablet Take 500 mg by mouth 2 (two) times daily.     Marland Kitchen aspirin 81 MG tablet Take 81 mg by mouth every morning.     . beta carotene w/minerals (OCUVITE) tablet Take 1 tablet by mouth every morning.     . Calcium Carb-Cholecalciferol (CALCIUM 600/VITAMIN D3) 600-800 MG-UNIT TABS Take 1 tablet by mouth every morning.    . carvedilol (COREG) 3.125 MG tablet Take 1 tablet (3.125 mg total) by mouth 2 (two) times daily. 180 tablet 3  . cetirizine (ZYRTEC) 10 MG tablet Take 10 mg by mouth every morning.     . clonazePAM (KLONOPIN) 2 MG tablet Take 1 tablet (2 mg total) by mouth 2 (two) times daily as needed for anxiety. (Patient taking differently:  Take 1 mg by mouth 2 (two) times daily as needed for anxiety. ) 180 tablet 1  . cyclobenzaprine (FLEXERIL) 10 MG tablet Take 1 tablet (10 mg total) by mouth 3 (three) times daily as needed for muscle spasms. For spasms 90 tablet 5  . fish oil-omega-3 fatty acids 1000 MG capsule Take 1 g by mouth every  morning.     . fluticasone (FLONASE) 50 MCG/ACT nasal spray INSTILL 2 SPRAYS INTO THE NOSE DAILY 48 g 1  . furosemide (LASIX) 20 MG tablet Take 1 tablet (20 mg total) by mouth daily. 30 tablet 1  . HYDROcodone-acetaminophen (NORCO/VICODIN) 5-325 MG per tablet Take 1-2 tablets by mouth every 4 (four) hours as needed for moderate pain or severe pain. 25 tablet 0  . ibuprofen (ADVIL,MOTRIN) 200 MG tablet Take 200 mg by mouth every 6 (six) hours as needed for headache or mild pain.    Marland Kitchen ketorolac (ACULAR) 0.5 % ophthalmic solution   0  . latanoprost (XALATAN) 0.005 % ophthalmic solution Place 1 drop into both eyes at bedtime.    Marland Kitchen lisinopril (PRINIVIL,ZESTRIL) 10 MG tablet Take 1 tablet (10 mg total) by mouth daily. 90 tablet 3  . Magnesium 500 MG TABS Take 1 tablet by mouth at bedtime.    . metoCLOPramide (REGLAN) 10 MG tablet   5  . metroNIDAZOLE (METROGEL) 0.75 % gel Apply 1 application topically 2 (two) times daily as needed (rosacea on face.).     Marland Kitchen nystatin (MYCOSTATIN) 100000 UNIT/ML suspension Take 5 mLs (500,000 Units total) by mouth 4 (four) times daily. 60 mL 0  . timolol (BETIMOL) 0.5 % ophthalmic solution Place 1 drop into both eyes every morning.    Marland Kitchen UNABLE TO FIND Apply 1 each topically as needed. 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 1 each 1   No current facility-administered medications for this visit.   Facility-Administered Medications Ordered in Other Visits  Medication Dose Route Frequency Provider Last Rate Last Dose  . sodium chloride 0.9 % injection 10 mL  10 mL Intracatheter PRN Nicholas Lose, MD   10 mL at 06/17/14 1635    PHYSICAL EXAMINATION: ECOG PERFORMANCE STATUS: 0 - Asymptomatic  Filed Vitals:   09/02/14 0955  BP: 123/76  Pulse: 80  Temp: 98 F (36.7 C)  Resp: 18   Filed Weights   09/02/14 0955  Weight: 190 lb 9.6 oz (86.456 kg)    GENERAL:alert, no distress and comfortable SKIN: skin color,  texture, turgor are normal, no rashes or significant lesions EYES: normal, Conjunctiva are pink and non-injected, sclera clear OROPHARYNX:no exudate, no erythema and lips, buccal mucosa, and tongue normal  NECK: supple, thyroid normal size, non-tender, without nodularity LYMPH:  no palpable lymphadenopathy in the cervical, axillary or inguinal LUNGS: clear to auscultation and percussion with normal breathing effort HEART: regular rate & rhythm and no murmurs and no lower extremity edema ABDOMEN:abdomen soft, non-tender and normal bowel sounds Musculoskeletal:no cyanosis of digits and no clubbing  NEURO: alert & oriented x 3 with fluent speech, no focal motor/sensory deficits  LABORATORY DATA:  I have reviewed the data as listed   Chemistry      Component Value Date/Time   NA 137 08/26/2014 0853   NA 134* 02/12/2014 1108   K 4.0 08/26/2014 0853   K 4.7 02/12/2014 1108   CL 100 02/12/2014 1108   CO2 19* 08/26/2014 0853   CO2 25 02/12/2014 1108   BUN 9.8 08/26/2014 0853  BUN 10 02/12/2014 1108   CREATININE 0.7 08/26/2014 0853   CREATININE 0.8 02/12/2014 1108      Component Value Date/Time   CALCIUM 9.3 08/26/2014 0853   CALCIUM 9.5 02/12/2014 1108   ALKPHOS 69 08/26/2014 0853   ALKPHOS 71 02/12/2014 1108   AST 16 08/26/2014 0853   AST 22 02/12/2014 1108   ALT 13 08/26/2014 0853   ALT 22 02/12/2014 1108   BILITOT 0.38 08/26/2014 0853   BILITOT 0.5 02/12/2014 1108       Lab Results  Component Value Date   WBC 5.1 08/26/2014   HGB 11.6 08/26/2014   HCT 35.1 08/26/2014   MCV 89.0 08/26/2014   PLT 328 08/26/2014   NEUTROABS 3.2 08/26/2014     RADIOGRAPHIC STUDIES: I have personally reviewed the radiology reports and agreed with their findings. Breast MRI showed remarkable improvement in the disease from 5.7 cm to 0.8 cm  ASSESSMENT & PLAN:  Breast cancer of upper-outer quadrant of right female breast Right breast invasive ductal carcinoma, palpable mass, 5 cm by  mammogram and 5.7 cm by MRI grade 3, ER PR HER-2 negative, associated skin dimpling, T3, N0, M0 stage IIB Ki-67 90%  Chemotherapy summary: Neoadjuvant chemotherapy with dose dense Adriamycin and Cytoxan 04/10/2014. followed by weekly Taxol x1 changed to Abraxane 11 from 06/17/2014 completed 08/26/2014 Chemotherapy toxicities: Neutropenia, nausea grade 1, loss of taste, decreased appetite, yeast infection, oral thrush, UTI, fatigue, neuropathy grade 2, leg edema. Radiology review: Breast MRI done following neoadjuvant chemotherapy revealed excellent response to treatment with decrease in size from 5.7 cm to 8 mm. Decrease in size of the axillary lymph nodes. Skin changes have also resolved.  Recommendation based multidisciplinary tumor board: 1. Punch biopsy of the skin before proceeding with surgery 2. Lumpectomy with sentinel lymph node biopsy and axillary staging 3. Followed by radiation therapy  Patient to return back to see [place] after undergoing surgery. Cardiomyopathy: Follows with cardiology.      No orders of the defined types were placed in this encounter.   The patient has a good understanding of the overall plan. she agrees with it. she will call with any problems that may develop before the next visit here.   Rulon Eisenmenger, MD

## 2014-09-09 ENCOUNTER — Other Ambulatory Visit: Payer: Self-pay | Admitting: General Surgery

## 2014-09-09 DIAGNOSIS — C50911 Malignant neoplasm of unspecified site of right female breast: Secondary | ICD-10-CM | POA: Diagnosis not present

## 2014-09-09 DIAGNOSIS — L905 Scar conditions and fibrosis of skin: Secondary | ICD-10-CM | POA: Diagnosis not present

## 2014-09-16 ENCOUNTER — Other Ambulatory Visit: Payer: Self-pay | Admitting: General Surgery

## 2014-09-16 DIAGNOSIS — C50911 Malignant neoplasm of unspecified site of right female breast: Secondary | ICD-10-CM

## 2014-09-16 DIAGNOSIS — H4011X2 Primary open-angle glaucoma, moderate stage: Secondary | ICD-10-CM | POA: Diagnosis not present

## 2014-09-17 ENCOUNTER — Other Ambulatory Visit: Payer: Self-pay | Admitting: *Deleted

## 2014-09-17 ENCOUNTER — Other Ambulatory Visit: Payer: Self-pay | Admitting: General Surgery

## 2014-09-17 DIAGNOSIS — C50911 Malignant neoplasm of unspecified site of right female breast: Secondary | ICD-10-CM

## 2014-09-18 ENCOUNTER — Telehealth: Payer: Self-pay | Admitting: Hematology and Oncology

## 2014-09-18 NOTE — Telephone Encounter (Signed)
/  Confirmed appointment for 06/07

## 2014-09-23 ENCOUNTER — Encounter (HOSPITAL_BASED_OUTPATIENT_CLINIC_OR_DEPARTMENT_OTHER): Payer: Self-pay | Admitting: *Deleted

## 2014-09-25 ENCOUNTER — Ambulatory Visit
Admission: RE | Admit: 2014-09-25 | Discharge: 2014-09-25 | Disposition: A | Discharge status: Home / Self Care | Payer: Medicare Other | Source: Ambulatory Visit | Attending: General Surgery | Admitting: General Surgery

## 2014-09-25 DIAGNOSIS — C50911 Malignant neoplasm of unspecified site of right female breast: Secondary | ICD-10-CM

## 2014-09-28 HISTORY — PX: BREAST LUMPECTOMY: SHX2

## 2014-09-29 ENCOUNTER — Ambulatory Visit (HOSPITAL_BASED_OUTPATIENT_CLINIC_OR_DEPARTMENT_OTHER)
Admission: RE | Admit: 2014-09-29 | Discharge: 2014-09-29 | Disposition: A | Discharge status: Home / Self Care | Payer: Medicare Other | Source: Ambulatory Visit | Attending: General Surgery | Admitting: General Surgery

## 2014-09-29 ENCOUNTER — Encounter (HOSPITAL_BASED_OUTPATIENT_CLINIC_OR_DEPARTMENT_OTHER)
Admission: RE | Disposition: A | Discharge status: Home / Self Care | Payer: Self-pay | Source: Ambulatory Visit | Attending: General Surgery

## 2014-09-29 ENCOUNTER — Encounter (HOSPITAL_BASED_OUTPATIENT_CLINIC_OR_DEPARTMENT_OTHER): Payer: Self-pay | Admitting: Anesthesiology

## 2014-09-29 ENCOUNTER — Encounter (HOSPITAL_COMMUNITY)
Admission: RE | Admit: 2014-09-29 | Discharge: 2014-09-29 | Disposition: A | Discharge status: Home / Self Care | Payer: Medicare Other | Source: Ambulatory Visit | Attending: General Surgery | Admitting: General Surgery

## 2014-09-29 ENCOUNTER — Ambulatory Visit (HOSPITAL_BASED_OUTPATIENT_CLINIC_OR_DEPARTMENT_OTHER): Payer: Medicare Other | Admitting: Anesthesiology

## 2014-09-29 ENCOUNTER — Ambulatory Visit
Admission: RE | Admit: 2014-09-29 | Discharge: 2014-09-29 | Disposition: A | Discharge status: Home / Self Care | Payer: Medicare Other | Source: Ambulatory Visit | Attending: General Surgery | Admitting: General Surgery

## 2014-09-29 DIAGNOSIS — Z9049 Acquired absence of other specified parts of digestive tract: Secondary | ICD-10-CM | POA: Insufficient documentation

## 2014-09-29 DIAGNOSIS — F419 Anxiety disorder, unspecified: Secondary | ICD-10-CM | POA: Insufficient documentation

## 2014-09-29 DIAGNOSIS — M1990 Unspecified osteoarthritis, unspecified site: Secondary | ICD-10-CM | POA: Diagnosis not present

## 2014-09-29 DIAGNOSIS — Z7982 Long term (current) use of aspirin: Secondary | ICD-10-CM | POA: Diagnosis not present

## 2014-09-29 DIAGNOSIS — C50411 Malignant neoplasm of upper-outer quadrant of right female breast: Secondary | ICD-10-CM | POA: Diagnosis not present

## 2014-09-29 DIAGNOSIS — K649 Unspecified hemorrhoids: Secondary | ICD-10-CM | POA: Insufficient documentation

## 2014-09-29 DIAGNOSIS — Z79899 Other long term (current) drug therapy: Secondary | ICD-10-CM | POA: Diagnosis not present

## 2014-09-29 DIAGNOSIS — Z452 Encounter for adjustment and management of vascular access device: Secondary | ICD-10-CM | POA: Diagnosis not present

## 2014-09-29 DIAGNOSIS — Z87891 Personal history of nicotine dependence: Secondary | ICD-10-CM | POA: Insufficient documentation

## 2014-09-29 DIAGNOSIS — G8918 Other acute postprocedural pain: Secondary | ICD-10-CM | POA: Diagnosis not present

## 2014-09-29 DIAGNOSIS — F329 Major depressive disorder, single episode, unspecified: Secondary | ICD-10-CM | POA: Insufficient documentation

## 2014-09-29 DIAGNOSIS — Z86010 Personal history of colonic polyps: Secondary | ICD-10-CM | POA: Insufficient documentation

## 2014-09-29 DIAGNOSIS — C50911 Malignant neoplasm of unspecified site of right female breast: Secondary | ICD-10-CM

## 2014-09-29 DIAGNOSIS — R079 Chest pain, unspecified: Secondary | ICD-10-CM | POA: Diagnosis not present

## 2014-09-29 DIAGNOSIS — N6011 Diffuse cystic mastopathy of right breast: Secondary | ICD-10-CM | POA: Diagnosis not present

## 2014-09-29 DIAGNOSIS — I10 Essential (primary) hypertension: Secondary | ICD-10-CM | POA: Diagnosis not present

## 2014-09-29 DIAGNOSIS — G43909 Migraine, unspecified, not intractable, without status migrainosus: Secondary | ICD-10-CM | POA: Diagnosis not present

## 2014-09-29 HISTORY — PX: BREAST LUMPECTOMY WITH RADIOACTIVE SEED AND SENTINEL LYMPH NODE BIOPSY: SHX6550

## 2014-09-29 HISTORY — PX: PORT-A-CATH REMOVAL: SHX5289

## 2014-09-29 HISTORY — DX: Myoneural disorder, unspecified: G70.9

## 2014-09-29 LAB — POCT HEMOGLOBIN-HEMACUE: HEMOGLOBIN: 13 g/dL (ref 12.0–15.0)

## 2014-09-29 SURGERY — BREAST LUMPECTOMY WITH RADIOACTIVE SEED AND SENTINEL LYMPH NODE BIOPSY
Anesthesia: Regional | Laterality: Right

## 2014-09-29 MED ORDER — LIDOCAINE HCL (CARDIAC) 20 MG/ML IV SOLN
INTRAVENOUS | Status: DC | PRN
  Administered 2014-09-29: 50 mg via INTRAVENOUS

## 2014-09-29 MED ORDER — MIDAZOLAM HCL 5 MG/5ML IJ SOLN
INTRAMUSCULAR | Status: DC | PRN
  Administered 2014-09-29: 2 mg via INTRAVENOUS

## 2014-09-29 MED ORDER — HYDROMORPHONE HCL 1 MG/ML IJ SOLN
INTRAMUSCULAR | Status: AC
  Filled 2014-09-29: qty 1

## 2014-09-29 MED ORDER — METHYLENE BLUE 1 % INJ SOLN
INTRAMUSCULAR | Status: AC
  Filled 2014-09-29: qty 10

## 2014-09-29 MED ORDER — HYDROCODONE-ACETAMINOPHEN 5-325 MG PO TABS: 1.0000 | ORAL_TABLET | ORAL | Status: DC | PRN

## 2014-09-29 MED ORDER — SODIUM CHLORIDE 0.9 % IJ SOLN
INTRAMUSCULAR | Status: AC
  Filled 2014-09-29: qty 10

## 2014-09-29 MED ORDER — FENTANYL CITRATE (PF) 100 MCG/2ML IJ SOLN
INTRAMUSCULAR | Status: DC | PRN
Start: 2014-09-29 — End: 2014-09-29
  Administered 2014-09-29 (×2): 50 ug via INTRAVENOUS

## 2014-09-29 MED ORDER — PROMETHAZINE HCL 25 MG/ML IJ SOLN: 6.2500 mg | INTRAMUSCULAR | Status: DC | PRN

## 2014-09-29 MED ORDER — CEFAZOLIN SODIUM-DEXTROSE 2-3 GM-% IV SOLR
INTRAVENOUS | Status: AC
  Filled 2014-09-29: qty 50

## 2014-09-29 MED ORDER — TECHNETIUM TC 99M SULFUR COLLOID FILTERED
1.0000 | Freq: Once | INTRAVENOUS | Status: AC | PRN
  Administered 2014-09-29: 1 via INTRADERMAL

## 2014-09-29 MED ORDER — LACTATED RINGERS IV SOLN
INTRAVENOUS | Status: DC
  Administered 2014-09-29 (×2): via INTRAVENOUS

## 2014-09-29 MED ORDER — OXYCODONE HCL 5 MG/5ML PO SOLN: 5.0000 mg | Freq: Once | ORAL | Status: DC | PRN

## 2014-09-29 MED ORDER — CHLORHEXIDINE GLUCONATE 4 % EX LIQD: 1.0000 "application " | Freq: Once | CUTANEOUS | Status: DC

## 2014-09-29 MED ORDER — FENTANYL CITRATE (PF) 100 MCG/2ML IJ SOLN
INTRAMUSCULAR | Status: AC
  Filled 2014-09-29: qty 6

## 2014-09-29 MED ORDER — DEXAMETHASONE SODIUM PHOSPHATE 4 MG/ML IJ SOLN
INTRAMUSCULAR | Status: DC | PRN
  Administered 2014-09-29: 10 mg via INTRAVENOUS

## 2014-09-29 MED ORDER — CEFAZOLIN SODIUM-DEXTROSE 2-3 GM-% IV SOLR: 2.0000 g | INTRAVENOUS | Status: DC

## 2014-09-29 MED ORDER — CEFAZOLIN SODIUM-DEXTROSE 2-3 GM-% IV SOLR
2.0000 g | INTRAVENOUS | Status: AC
  Administered 2014-09-29: 2 g via INTRAVENOUS

## 2014-09-29 MED ORDER — BUPIVACAINE-EPINEPHRINE 0.25% -1:200000 IJ SOLN
INTRAMUSCULAR | Status: DC | PRN
  Administered 2014-09-29: 10 mL

## 2014-09-29 MED ORDER — MIDAZOLAM HCL 2 MG/2ML IJ SOLN
1.0000 mg | INTRAMUSCULAR | Status: DC | PRN
  Administered 2014-09-29: 2 mg via INTRAVENOUS

## 2014-09-29 MED ORDER — MIDAZOLAM HCL 2 MG/2ML IJ SOLN
INTRAMUSCULAR | Status: AC
  Filled 2014-09-29: qty 2

## 2014-09-29 MED ORDER — MEPERIDINE HCL 25 MG/ML IJ SOLN: 6.2500 mg | INTRAMUSCULAR | Status: DC | PRN

## 2014-09-29 MED ORDER — PROPOFOL 10 MG/ML IV BOLUS
INTRAVENOUS | Status: DC | PRN
  Administered 2014-09-29: 200 mg via INTRAVENOUS

## 2014-09-29 MED ORDER — SODIUM CHLORIDE 0.9 % IJ SOLN
INTRAMUSCULAR | Status: DC | PRN
  Administered 2014-09-29: 5 mL

## 2014-09-29 MED ORDER — BUPIVACAINE-EPINEPHRINE (PF) 0.25% -1:200000 IJ SOLN
INTRAMUSCULAR | Status: AC
  Filled 2014-09-29: qty 30

## 2014-09-29 MED ORDER — HYDROMORPHONE HCL 1 MG/ML IJ SOLN
0.2500 mg | INTRAMUSCULAR | Status: DC | PRN
  Administered 2014-09-29 (×2): 0.5 mg via INTRAVENOUS

## 2014-09-29 MED ORDER — ONDANSETRON HCL 4 MG/2ML IJ SOLN
INTRAMUSCULAR | Status: DC | PRN
  Administered 2014-09-29: 4 mg via INTRAVENOUS

## 2014-09-29 MED ORDER — OXYCODONE HCL 5 MG PO TABS: 5.0000 mg | ORAL_TABLET | Freq: Once | ORAL | Status: DC | PRN

## 2014-09-29 MED ORDER — FENTANYL CITRATE (PF) 100 MCG/2ML IJ SOLN
INTRAMUSCULAR | Status: AC
  Filled 2014-09-29: qty 2

## 2014-09-29 MED ORDER — FENTANYL CITRATE (PF) 100 MCG/2ML IJ SOLN
50.0000 ug | INTRAMUSCULAR | Status: DC | PRN
Start: 2014-09-29 — End: 2014-09-29
  Administered 2014-09-29: 50 ug via INTRAVENOUS

## 2014-09-29 MED ORDER — BUPIVACAINE-EPINEPHRINE (PF) 0.5% -1:200000 IJ SOLN
INTRAMUSCULAR | Status: DC | PRN
  Administered 2014-09-29: 30 mL

## 2014-09-29 MED ORDER — GLYCOPYRROLATE 0.2 MG/ML IJ SOLN: 0.2000 mg | Freq: Once | INTRAMUSCULAR | Status: DC | PRN

## 2014-09-29 SURGICAL SUPPLY — 55 items
APPLIER CLIP 9.375 MED OPEN (MISCELLANEOUS) ×4
APR CLP MED 9.3 20 MLT OPN (MISCELLANEOUS) ×2
BINDER BREAST LRG (GAUZE/BANDAGES/DRESSINGS) IMPLANT
BINDER BREAST MEDIUM (GAUZE/BANDAGES/DRESSINGS) IMPLANT
BINDER BREAST XLRG (GAUZE/BANDAGES/DRESSINGS) IMPLANT
BINDER BREAST XXLRG (GAUZE/BANDAGES/DRESSINGS) ×2 IMPLANT
BLADE SURG 15 STRL LF DISP TIS (BLADE) ×2 IMPLANT
BLADE SURG 15 STRL SS (BLADE) ×4
CANISTER SUC SOCK COL 7IN (MISCELLANEOUS) IMPLANT
CANISTER SUCT 1200ML W/VALVE (MISCELLANEOUS) ×2 IMPLANT
CHLORAPREP W/TINT 26ML (MISCELLANEOUS) ×4 IMPLANT
CLIP APPLIE 9.375 MED OPEN (MISCELLANEOUS) ×2 IMPLANT
COVER BACK TABLE 60X90IN (DRAPES) ×4 IMPLANT
COVER MAYO STAND STRL (DRAPES) ×4 IMPLANT
COVER PROBE W GEL 5X96 (DRAPES) ×4 IMPLANT
DECANTER SPIKE VIAL GLASS SM (MISCELLANEOUS) IMPLANT
DEVICE DUBIN W/COMP PLATE 8390 (MISCELLANEOUS) ×4 IMPLANT
DRAPE LAPAROSCOPIC ABDOMINAL (DRAPES) ×4 IMPLANT
DRAPE LAPAROTOMY T 102X78X121 (DRAPES) ×2 IMPLANT
DRAPE UTILITY XL STRL (DRAPES) ×6 IMPLANT
ELECT COATED BLADE 2.86 ST (ELECTRODE) ×4 IMPLANT
ELECT REM PT RETURN 9FT ADLT (ELECTROSURGICAL) ×4
ELECTRODE REM PT RTRN 9FT ADLT (ELECTROSURGICAL) ×2 IMPLANT
GLOVE BIOGEL PI IND STRL 7.0 (GLOVE) IMPLANT
GLOVE BIOGEL PI IND STRL 8 (GLOVE) ×2 IMPLANT
GLOVE BIOGEL PI INDICATOR 7.0 (GLOVE) ×2
GLOVE BIOGEL PI INDICATOR 8 (GLOVE) ×4
GLOVE ECLIPSE 7.5 STRL STRAW (GLOVE) ×4 IMPLANT
GLOVE SURG SS PI 7.5 STRL IVOR (GLOVE) ×2 IMPLANT
GOWN STRL REUS W/ TWL LRG LVL3 (GOWN DISPOSABLE) ×2 IMPLANT
GOWN STRL REUS W/ TWL XL LVL3 (GOWN DISPOSABLE) ×2 IMPLANT
GOWN STRL REUS W/TWL LRG LVL3 (GOWN DISPOSABLE) ×4
GOWN STRL REUS W/TWL XL LVL3 (GOWN DISPOSABLE) ×8
KIT MARKER MARGIN INK (KITS) ×4 IMPLANT
LIQUID BAND (GAUZE/BANDAGES/DRESSINGS) ×4 IMPLANT
NDL HYPO 25X1 1.5 SAFETY (NEEDLE) ×2 IMPLANT
NDL SAFETY ECLIPSE 18X1.5 (NEEDLE) IMPLANT
NEEDLE HYPO 18GX1.5 SHARP (NEEDLE) ×8
NEEDLE HYPO 25X1 1.5 SAFETY (NEEDLE) ×8 IMPLANT
NS IRRIG 1000ML POUR BTL (IV SOLUTION) ×2 IMPLANT
PACK BASIN DAY SURGERY FS (CUSTOM PROCEDURE TRAY) ×4 IMPLANT
PENCIL BUTTON HOLSTER BLD 10FT (ELECTRODE) ×4 IMPLANT
SLEEVE SCD COMPRESS KNEE MED (MISCELLANEOUS) ×4 IMPLANT
SPONGE LAP 4X18 X RAY DECT (DISPOSABLE) ×4 IMPLANT
SUT MNCRL AB 4-0 PS2 18 (SUTURE) ×4 IMPLANT
SUT MON AB 4-0 PC3 18 (SUTURE) ×4 IMPLANT
SUT SILK 2 0 SH (SUTURE) IMPLANT
SUT SILK 4 0 TIES 17X18 (SUTURE) IMPLANT
SUT VICRYL 3-0 CR8 SH (SUTURE) ×4 IMPLANT
SYR CONTROL 10ML LL (SYRINGE) ×6 IMPLANT
TOWEL OR 17X24 6PK STRL BLUE (TOWEL DISPOSABLE) ×6 IMPLANT
TOWEL OR NON WOVEN STRL DISP B (DISPOSABLE) ×4 IMPLANT
TUBE CONNECTING 20'X1/4 (TUBING) ×1
TUBE CONNECTING 20X1/4 (TUBING) ×1 IMPLANT
YANKAUER SUCT BULB TIP NO VENT (SUCTIONS) ×2 IMPLANT

## 2014-09-29 NOTE — Discharge Instructions (Signed)
[facility] °Office Phone Number [phone] ° °BREAST BIOPSY/ PARTIAL MASTECTOMY: POST OP INSTRUCTIONS ° °Always review your discharge instruction sheet given to you by the facility where your surgery was performed. ° °IF YOU HAVE DISABILITY OR FAMILY LEAVE FORMS, YOU MUST BRING THEM TO THE OFFICE FOR PROCESSING.  DO NOT GIVE THEM TO YOUR DOCTOR. ° °1. A prescription for pain medication may be given to you upon discharge.  Take your pain medication as prescribed, if needed.  If narcotic pain medicine is not needed, then you may take acetaminophen (Tylenol) or ibuprofen (Advil) as needed. °2. Take your usually prescribed medications unless otherwise directed °3. If you need a refill on your pain medication, please contact your pharmacy.  They will contact our office to request authorization.  Prescriptions will not be filled after 5pm or on week-ends. °4. You should eat very light the first 24 hours after surgery, such as soup, crackers, pudding, etc.  Resume your normal diet the day after surgery. °5. Most patients will experience some swelling and bruising in the breast.  Ice packs and a good support bra will help.  Swelling and bruising can take several days to resolve.  °6. It is common to experience some constipation if taking pain medication after surgery.  Increasing fluid intake and taking a stool softener will usually help or prevent this problem from occurring.  A mild laxative (Milk of Magnesia or Miralax) should be taken according to package directions if there are no bowel movements after 48 hours. °7. Unless discharge instructions indicate otherwise, you may remove your bandages 24-48 hours after surgery, and you may shower at that time.  You may have steri-strips (small skin tapes) in place directly over the incision.  These strips should be left on the skin for 7-10 days.  If your surgeon used skin glue on the incision, you may shower in 24 hours.  The glue will flake off over the  next 2-3 weeks.  Any sutures or staples will be removed at the office during your follow-up visit. °8. ACTIVITIES:  You may resume regular daily activities (gradually increasing) beginning the next day.  Wearing a good support bra or sports bra minimizes pain and swelling.  You may have sexual intercourse when it is comfortable. °a. You may drive when you no longer are taking prescription pain medication, you can comfortably wear a seatbelt, and you can safely maneuver your car and apply brakes. °b. RETURN TO WORK:  ______________________________________________________________________________________ °9. You should see your doctor in the office for a follow-up appointment approximately two weeks after your surgery.  Your doctor’s nurse will typically make your follow-up appointment when she calls you with your pathology report.  Expect your pathology report 2-3 business days after your surgery.  You may call to check if you do not hear from us after three days. °10. OTHER INSTRUCTIONS: _______________________________________________________________________________________________ _____________________________________________________________________________________________________________________________________ °_____________________________________________________________________________________________________________________________________ °_____________________________________________________________________________________________________________________________________ ° °WHEN TO CALL YOUR DOCTOR: °1. Fever over 101.0 °2. Nausea and/or vomiting. °3. Extreme swelling or bruising. °4. Continued bleeding from incision. °5. Increased pain, redness, or drainage from the incision. ° °The clinic staff is available to answer your questions during regular business hours.  Please don’t hesitate to call and ask to speak to one of the nurses for clinical concerns.  If you have a medical emergency, go to the nearest  emergency room or call 911.  A surgeon from [facility] is always on call at the hospital. ° °For further questions, please visit [email or web address]  ° ° °  Post Anesthesia Home Care Instructions ° °Activity: °Get plenty of rest for the remainder of the day. A responsible adult should stay with you for 24 hours following the procedure.  °For the next 24 hours, DO NOT: °-Drive a car °-Operate machinery °-Drink alcoholic beverages °-Take any medication unless instructed by your physician °-Make any legal decisions or sign important papers. ° °Meals: °Start with liquid foods such as gelatin or soup. Progress to regular foods as tolerated. Avoid greasy, spicy, heavy foods. If nausea and/or vomiting occur, drink only clear liquids until the nausea and/or vomiting subsides. Call your physician if vomiting continues. ° °Special Instructions/Symptoms: °Your throat may feel dry or sore from the anesthesia or the breathing tube placed in your throat during surgery. If this causes discomfort, gargle with warm salt water. The discomfort should disappear within 24 hours. ° °If you had a scopolamine patch placed behind your ear for the management of post- operative nausea and/or vomiting: ° °1. The medication in the patch is effective for 72 hours, after which it should be removed.  Wrap patch in a tissue and discard in the trash. Wash hands thoroughly with soap and water. °2. You may remove the patch earlier than 72 hours if you experience unpleasant side effects which may include dry mouth, dizziness or visual disturbances. °3. Avoid touching the patch. Wash your hands with soap and water after contact with the patch. °  ° °Regional Anesthesia Blocks ° °1. Numbness or the inability to move the "blocked" extremity may last from 3-48 hours after placement. The length of time depends on the medication injected and your individual response to the medication. If the numbness is not going away after 48 hours, call  your surgeon. ° °2. The extremity that is blocked will need to be protected until the numbness is gone and the  Strength has returned. Because you cannot feel it, you will need to take extra care to avoid injury. Because it may be weak, you may have difficulty moving it or using it. You may not know what position it is in without looking at it while the block is in effect. ° °3. For blocks in the legs and feet, returning to weight bearing and walking needs to be done carefully. You will need to wait until the numbness is entirely gone and the strength has returned. You should be able to move your leg and foot normally before you try and bear weight or walk. You will need someone to be with you when you first try to ensure you do not fall and possibly risk injury. ° °4. Bruising and tenderness at the needle site are common side effects and will resolve in a few days. ° °5. Persistent numbness or new problems with movement should be communicated to the surgeon or the [facility] ([phone])/ [facility] ([phone]). ° °

## 2014-09-29 NOTE — Interval H&P Note (Signed)
History and Physical Interval Note:  09/29/2014 9:19 AM  Stephanie Olsen  has presented today for surgery, with the diagnosis of Cancer right breast  The various methods of treatment have been discussed with the patient and family. After consideration of risks, benefits and other options for treatment, the patient has consented to  Procedure(s): RIGHT BREAST LUMPECTOMY WITH RADIOACTIVE SEED AND RIGHT AXILLARY SENTINEL LYMPH NODE BIOPSY (Right) REMOVAL PORT-A-CATH (N/A) as a surgical intervention .  The patient's history has been reviewed, patient examined, no change in status, stable for surgery.  I have reviewed the patient's chart and labs.  Questions were answered to the patient's satisfaction.     [name]

## 2014-09-29 NOTE — H&P (Signed)
History of Present Illness Stephanie Kitchen T. Veretta Sabourin MD; 09/09/2014 10:29 AM) The patient is a 69 year old female who presents with breast cancer. She returns for surgical planning following neoadjuvant chemotherapy. Her initial presentation was as below:  She is a post menopausal female referred [name] for evaluation of recently diagnosed carcinoma of the right breast. she states she recently found a mass in her right breast which she initially discovered only about 3 weeks ago. She feels it has enlarged since then. She was referred for evaluation at the breast center.. Subsequent imaging included diagnostic mamogram showing an irregular spiculatreastss in the upper outer quadrant of the right breast and ultrasound showing aan irregular hypoechoic mass in the 10:00 position of the right breast 4 cm from the nipple measuring 4.9 x 3 x 5.0 cm. Evaluation of the right axilla shows 2 lower axillary lymph nodes with mildly thickened cortices. An ultrasound guided breast biopsy was performed on 03/23/2014 with pathology revealing invasive carcinoma of the breast. axillary biopsy showed only fibroadipose tissue. Subsequent bilateral breast MRI was performed revealing a centrally necrotic oval mass in the upper outer quadrant of the right breast measuring 5.7 x 4.7 cm. No other abnormal areas of enhancement and right axillary lymph nodes appeared normal. She is seen now in breast multidisciplinary clinic for initial treatment planning. She has experienced a palpable lump with apparent rapid enlargement as noted above. She does not have a personal history of any previous breast problems.  Findings at that time were the following: Tumor size: 5.7 cm Tumor grade: 3 Estrogen Receptor: negative Progesterone Receptor: negative Her-2 neu: nnegative Lymph node status: negative  She has tolerated her neoadjuvant chemotherapy with some expected side effects but is feeling pretty well at this  point.  Follow-up MRI has shown significant shrinkage of her tumor: FINDINGS: Breast composition: c. Heterogeneous fibroglandular tissue.  Background parenchymal enhancement: Moderate.  Right breast: There has been interval decrease in size of the previously noted irregular mass within the upper, outer right breast which now measures 6 x 6 x 8 mm from 5.7 x 4.7 x 3.4 cm. Biopsy clip artifact is noted just posterior and inferior to the residual enhancement on series 4, image 64. The previously noted enhancement of the skin of the central right breast is no longer appreciated. A small focus of enhancement 2 cm lateral to the mass does not appear significantly changed from prior study and is likely part of the normal background enhancement.  Left breast: No suspicious mass or enhancement.  Lymph nodes: Interval decrease in size of several right axillary lymph nodes (series 6, image 73).  Ancillary findings: Partially imaged left Port-A-Cath.  IMPRESSION: 1. Findings compatible with response to therapy. There has been interval decrease in size of the previously described irregular enhancing mass within the lateral right breast. 2. Interval decrease in size of several previously noted prominent right axillary lymph nodes which may indicate a response to therapy.  RECOMMENDATION: Treatment plan.  BI-RADS CATEGORY 6: Known biopsy-proven malignancy.  She did have some thickening of the overlying skin at her initial presentation and after discussion in multidisciplinary conference was recommended that we obtain a punch skin biopsy overlying the tumor to rule out any skin involvement.    Problem List/Past Medical Stephanie Jolly, MD; 09/09/2014 10:29 AM) BREAST CANCER, RIGHT (174.9  C50.911)  Other Problems Stephanie Jolly, MD; 09/09/2014 10:29 AM) Lump In Breast High blood pressure Migraine Headache Back Pain Arthritis Breast  Cancer Hemorrhoids  Depression Anxiety Disorder  Past Surgical History Stephanie Jolly, MD; 09/09/2014 10:29 AM) Colon Polyp Removal - Colonoscopy Cataract Surgery Bilateral. Sentinel Lymph Node Biopsy Oral Surgery Breast Biopsy Right. Appendectomy  Diagnostic Studies History Stephanie Jolly, MD; 09/09/2014 10:29 AM) Mammogram within last year Colonoscopy 1-5 years ago Pap Smear 1-5 years ago  Allergies Stephanie Olsen, CMA; 09/09/2014 10:20 AM) No Known Drug Allergies05/03/2015  Medication History Stephanie Olsen, CMA; 09/09/2014 10:25 AM) Carvedilol (3.125MG Tablet, Oral) Active. Vitamin C Plus (500MG Tablet, Oral) Active. Aspirin (81MG Tablet, Oral) Active. Ocuvite (Oral) Active. Calcium Gluconate (600MG Tablet, Oral) Active. Coreg (3.125MG Tablet, Oral) Active. ZyrTEC Childrens Allergy (10MG Tablet Chewable, Oral) Active. KlonoPIN (2MG Tablet, Oral) Active. Flexeril (10MG Tablet, Oral) Active. Fish Oil (1000MG Capsule DR, Oral) Active. Flonase (50MCG/DOSE Inhaler, Nasal) Active. Lasix (20MG Tablet, Oral) Active. Latanoprost (0.005% Solution, Ophthalmic) Active. Magnesium Gluconate (500MG Tablet, Oral) Active. Reglan (10MG Tablet, Oral) Active. MetroNIDAZOLE (0.75% Gel, External) Active. Betimol (0.5% Solution, Ophthalmic) Active. Medications Reconciled  Social History Stephanie Jolly, MD; 09/09/2014 10:29 AM) Caffeine use Carbonated beverages, Coffee. Alcohol use Moderate alcohol use. Tobacco use Former smoker. No drug use  Family History Stephanie Jolly, MD; 09/09/2014 10:29 AM) Arthritis Family Members In General, Father. Respiratory Condition Family Members In General. Ischemic Bowel Disease Family Members In General. Seizure disorder Father. Hypertension Mother. Colon Cancer Family Members In General. Bleeding disorder Father. Heart disease in female family member before age 62 Heart Disease Family  Members In General, Mother.  Pregnancy / Birth History Stephanie Jolly, MD; 09/09/2014 10:29 AM) Irregular periods Gravida 2 Maternal age 67-30 Para 1 Age at menarche 26 years. Contraceptive History Oral contraceptives. Age of menopause 10-55  Vitals Stephanie Olsen CMA; 09/09/2014 10:25 AM) 09/09/2014 10:25 AM Weight: 192 lb Height: 66in Body Surface Area: 2.01 m Body Mass Index: 30.99 kg/m Temp.: 98.65F(Oral)  Pulse: 54 (Regular)  Resp.: 17 (Unlabored)  BP: 124/70 (Sitting, Left Arm, Standard)    Physical Exam Stephanie Kitchen T. Cellie Dardis MD; 09/09/2014 10:37 AM) The physical exam findings are as follows: Note:General: Alert, mildly overweight Caucasian female, in no distress Skin: Warm and dry without rash or infection. HEENT: No palpable masses or thyromegaly. Sclera nonicteric. Pupils equal round and reactive. Oropharynx clear. Lymph nodes: No cervical, supraclavicular, or inguinal nodes palpable. Breasts: I cannot feel any palpable masses in either breast with particular attention to the upper outer quadrant of the right breast. No discernible skin changes. No nipple inversion or crusting. Lungs: Breath sounds clear and equal. No wheezing or increased work of breathing. Cardiovascular: Regular rate and rhythm without murmer. No JVD or edema. Peripheral pulses intact. No carotid bruits. Neurologic: Alert and fully oriented. Gait normal. No focal weakness. Psychiatric: Normal mood and affect. Thought content appropriate with normal judgement and insight  After explaining the procedure with sterile technique and local anesthesia I obtained a 2 mm full-thickness skin punch biopsy in the upper outer quadrant of the right breast overlying the area of previous thickening.    Assessment & Plan Stephanie Kitchen T. Jordell Outten MD; 09/09/2014 10:54 AM) BREAST CANCER, RIGHT (174.9  C50.911) Impression: 69 year old female with a diagnosis of cancer of the right breast, uupper  outer quadrant. Clinical stage II B, triple negative. Questionable lymph node involvement by initial imaging with negative biopsy although no lymphatic tissue obtained. Excellent response to neoadjuvant chemotherapy with now an 8 mm residual tumor. She had some skin changes probably not tumor infiltration which have resolved and biopsy was done  today to rule out tumor. Lymph nodes were normal on imaging. Assuming her skin biopsy is negative which I feel sure will be we will plan to proceed with radioactive seed localized right breast lumpectomy and right axillary sentinel lymph node biopsy with removal of her Port-A-Cath. We discussed the procedure in detail and alternatives and risks of surgery including lymphedema, bleeding, infection, management of positive margins and anesthetic risks. All of her and her daughter's questions were answered. Current Plans  Schedule for Surgery Radioactive seed localized right breast lumpectomy with right axillary sentinel lymph node biopsy and removal of Port-A-Cath

## 2014-09-29 NOTE — Transfer of Care (Signed)
Immediate Anesthesia Transfer of Care Note  Patient: Stephanie Olsen  Procedure(s) Performed: Procedure(s): RIGHT BREAST LUMPECTOMY WITH RADIOACTIVE SEED AND RIGHT AXILLARY SENTINEL LYMPH NODE BIOPSY (Right) REMOVAL PORT-A-CATH (Left)  Patient Location: PACU  Anesthesia Type:GA combined with regional for post-op pain  Level of Consciousness: awake and patient cooperative  Airway & Oxygen Therapy: Patient Spontanous Breathing and Patient connected to face mask oxygen  Post-op Assessment: Report given to RN and Post -op Vital signs reviewed and stable  Post vital signs: Reviewed and stable  Last Vitals:  Filed Vitals:   09/29/14 0820  BP: 122/63  Pulse: 68  Temp:   Resp: 19    Complications: No apparent anesthesia complications

## 2014-09-29 NOTE — Progress Notes (Signed)
Assisted Dr. Germeroth with right, ultrasound guided, pectoralis block. Side rails up, monitors on throughout procedure. See vital signs in flow sheet. Tolerated Procedure well. 

## 2014-09-29 NOTE — Anesthesia Postprocedure Evaluation (Signed)
Anesthesia Post Note  Patient: Stephanie Olsen  Procedure(s) Performed: Procedure(s) (LRB): RIGHT BREAST LUMPECTOMY WITH RADIOACTIVE SEED AND RIGHT AXILLARY SENTINEL LYMPH NODE BIOPSY (Right) REMOVAL PORT-A-CATH (Left)  Anesthesia type: General  Patient location: PACU  Post pain: Pain level controlled  Post assessment: Post-op Vital signs reviewed  Last Vitals: BP 134/85 mmHg  Pulse 74  Temp(Src) 36.7 C (Oral)  Resp 16  Ht 5\' 6"  (1.676 m)  Wt 189 lb (85.73 kg)  BMI 30.52 kg/m2  SpO2 97%  Post vital signs: Reviewed  Level of consciousness: sedated  Complications: No apparent anesthesia complications

## 2014-09-29 NOTE — Anesthesia Procedure Notes (Addendum)
Anesthesia Regional Block:  Pectoralis block  Pre-Anesthetic Checklist: ,, timeout performed, Correct Patient, Correct Site, Correct Laterality, Correct Procedure, Correct Position, site marked, Risks and benefits discussed, Surgical consent,  Pre-op evaluation,  Post-op pain management  Laterality: Right  Prep: chloraprep       Needles:  Injection technique: Single-shot  Needle Type: Stimiplex     Needle Length: 9cm 9 cm     Additional Needles:  Procedures: ultrasound guided (picture in chart) Pectoralis block Narrative:  Injection made incrementally with aspirations every 5 mL.  Performed by: Personally  Anesthesiologist: Nolon Nations  Additional Notes: Patient tolerated well. Good fascial spread noted.   Procedure Name: LMA Insertion Date/Time: 09/29/2014 9:28 AM Performed by: Toula Moos L Pre-anesthesia Checklist: Patient identified, Emergency Drugs available, Suction available, Patient being monitored and Timeout performed Patient Re-evaluated:Patient Re-evaluated prior to inductionOxygen Delivery Method: Circle System Utilized Preoxygenation: Pre-oxygenation with 100% oxygen Intubation Type: IV induction Ventilation: Mask ventilation without difficulty LMA: LMA inserted LMA Size: 4.0 Number of attempts: 1 Airway Equipment and Method: Bite block Placement Confirmation: positive ETCO2 Tube secured with: Tape Dental Injury: Teeth and Oropharynx as per pre-operative assessment

## 2014-09-29 NOTE — Anesthesia Preprocedure Evaluation (Signed)
Anesthesia Evaluation  Patient identified by MRN, date of birth, ID band Patient awake    Reviewed: Allergy & Precautions, H&P , NPO status , Patient's Chart, lab work & pertinent test results  Airway Mallampati: II  TM Distance: >3 FB Neck ROM: Full    Dental no notable dental hx.    Pulmonary former smoker,  breath sounds clear to auscultation  Pulmonary exam normal       Cardiovascular Exercise Tolerance: Good hypertension, Pt. on medications Normal cardiovascular examRhythm:Regular Rate:Normal     Neuro/Psych  Headaches, Anxiety    GI/Hepatic negative GI ROS, Neg liver ROS,   Endo/Other  negative endocrine ROS  Renal/GU negative Renal ROS     Musculoskeletal negative musculoskeletal ROS (+)   Abdominal (+) + obese,   Peds  Hematology negative hematology ROS (+)   Anesthesia Other Findings   Reproductive/Obstetrics negative OB ROS                             Anesthesia Physical  Anesthesia Plan  ASA: II  Anesthesia Plan: General and Regional   Post-op Pain Management:    Induction: Intravenous  Airway Management Planned: LMA  Additional Equipment:   Intra-op Plan:   Post-operative Plan: Extubation in OR  Informed Consent: I have reviewed the patients History and Physical, chart, labs and discussed the procedure including the risks, benefits and alternatives for the proposed anesthesia with the patient or authorized representative who has indicated his/her understanding and acceptance.   Dental advisory given  Plan Discussed with: CRNA  Anesthesia Plan Comments:         Anesthesia Quick Evaluation

## 2014-09-29 NOTE — Op Note (Signed)
Preoperative Diagnosis: cancer right breast  Postoprative Diagnosis: cancer right breast  Procedure: Procedure(s): BLUE DYE INJECTION RIGHT BREAST, RIGHT BREAST LUMPECTOMY WITH RADIOACTIVE SEED AND RIGHT AXILLARY SENTINEL LYMPH NODE BIOPSY REMOVAL PORT-A-CATH   Surgeon: Excell Seltzer T   Assistants: none  Anesthesia:  General LMA anesthesia  Indications: patient is a 69 year old female initially presenting with a stage IIB triple negative invasive ductal carcinoma the upper outer right breast measuring 5.7 cm on presentation. She has undergone neoadjuvant chemotherapy with follow-up MRI showing reduction of the tumor to 8 mm. She had some initially mildly enlarged lymph nodes by ultrasound, negative on MRI, biopsy showing only fibrofatty tissue. She now presents for surgical treatment with plans for seed localized right breast lumpectomy and right axillary sentinel lymph node biopsy with removal of her Port-A-Cath. The procedure and indications, alternatives and risks have been discussed extensively and detailed elsewhere. 3 days ago she underwent accurate placement of a radioactive seed at the tumor and the clip site in the right breast. In the holding area seed placement was confirmed with the neoprobe. She underwent injection of 5 mCi of technetium sulfur colloid intradermally around the right breast prior to the procedure.    Procedure Detail:  Patient was brought to the operating room, placed in the supine position on the operating table, and laryngeal mask general anesthesia induced. She received preoperative IV antibiotics. Under sterile technique after patient timeout I injected 5 mL of dilute methylene blue subcutaneously beneath the right nipple and massaged this for several minutes. Following this the entire anterior chest breast axilla and upper arm were widely sterilely prepped and draped including the Port-A-Cath site on the left. The lumpectomy was approached initially. A hot  area in the upper outer right breast was localized and a curvilinear incision made at this point. Dissection was carried down through the subcutaneous tissue. The breast capsule and short skin and subcutaneous flaps raised. Using the neoprobe for guidance I then excised with cautery and generous portion of breast tissue around the seed using the neoprobe for guidance. The breast tissue was firm and dense but no discrete mass. Approximately 3 cm spherical lumpectomy was performed. The seed was confirmed centrally located in the specimen with the neoprobe. The specimen was inked for margins. Specimen mammography showed the marking clip and the radioactive seed centrally located within the specimen. This was sent for permanent pathology. Hemostasis was obtained in the wound with cautery. Clips were used to mark the lumpectomy cavity. The breast and subcutaneous tissue was closed with interrupted 3-0 Vicryl. Attention was then turned to the sentinel lymph node biopsy. A hot area in the right axilla was identified. The neoprobe and a small transverse incision made. Dissection was carried down through the saphenous tissue with cautery and the clavipectoral fascia opened. Using the neoprobe for guidance I dissected down onto a slightly enlarged and firm lymph node with high counts. This was completely excised with cautery and ex vivo had counts of over 1000. This was sent as hot axillary sentinel node #1. There was another area with slightly elevated counts with blunt dissection identified a slightly firm normal-sized lymph nodes with elevated counts which was completely excised and ex vivo had counts of about 300. No blue dye was seen. This was sent as sentinel lymph node #2. At this point were minimal if any counts in the axilla, less than 10 and no palpable abnormalities. Hemostasis was obtained and the deep subcutaneous Tissue was closed with interrupted 3-0 Vicryl. Skin  of the breast and axillary incisions were  closed with subcuticular 4-0 Monocryl. Following this attention was turned to the Port-A-Cath removal. The previous scar was elliptically excised and dissection carried down onto the catheter and the catheter withdrawn intact. The port was dissected away from soft tissue, sutures removed and the Port-A-Cath removed intact. Hemostasis was obtained with cautery. The wound was closed with subcutaneous 4-0 Monocryl in subcuticular 4-0 Monocryl. All incisions were dressed with Liquiban. Sponge needle and instrument counts were correct.    Findings: As above  Estimated Blood Loss:  Minimal         Drains: none  Blood Given: none          Specimens: #1 right breast lumpectomy     #2 right axillary sentinel lymph node      #3 right axillary sentinel lymph node        Complications:  * No complications entered in OR log *         Disposition: PACU - hemodynamically stable.         Condition: stable

## 2014-09-30 ENCOUNTER — Encounter (HOSPITAL_BASED_OUTPATIENT_CLINIC_OR_DEPARTMENT_OTHER): Payer: Self-pay | Admitting: General Surgery

## 2014-10-05 NOTE — Assessment & Plan Note (Signed)
Breast cancer of upper-outer quadrant of right female breast Right breast invasive ductal carcinoma, palpable mass, 5 cm by mammogram and 5.7 cm by MRI grade 3, ER PR HER-2 negative, associated skin dimpling, T3, N0, M0 stage IIB Ki-67 90%  Chemotherapy summary: Neoadjuvant chemotherapy with dose dense Adriamycin and Cytoxan 04/10/2014. followed by weekly Taxol x1 changed to Abraxane 11 from 06/17/2014 completed 08/26/2014 Rt Lumpectomy: 09/29/14: Path CR 0/2 LN  Plan: 1. Adjuvant XRT 2. Followed by Observation  Cardiomyopathy: follows cardiology  RTC in 3 months

## 2014-10-06 ENCOUNTER — Other Ambulatory Visit: Payer: Self-pay | Admitting: *Deleted

## 2014-10-06 ENCOUNTER — Telehealth: Payer: Self-pay | Admitting: Hematology and Oncology

## 2014-10-06 ENCOUNTER — Ambulatory Visit (HOSPITAL_BASED_OUTPATIENT_CLINIC_OR_DEPARTMENT_OTHER): Payer: Medicare Other | Admitting: Hematology and Oncology

## 2014-10-06 ENCOUNTER — Encounter: Payer: Self-pay | Admitting: *Deleted

## 2014-10-06 VITALS — BP 134/59 | HR 68 | Temp 98.0°F | Resp 20 | Ht 66.0 in | Wt 191.9 lb

## 2014-10-06 DIAGNOSIS — C50811 Malignant neoplasm of overlapping sites of right female breast: Secondary | ICD-10-CM | POA: Diagnosis not present

## 2014-10-06 DIAGNOSIS — C50411 Malignant neoplasm of upper-outer quadrant of right female breast: Secondary | ICD-10-CM

## 2014-10-06 NOTE — Progress Notes (Signed)
Met with pt during post op visit with Dr. Gudena. Relate she is doing well, but having some discomfort under her arm  Talked to pt about ABC/PT. Relate she is interested in participating in class. Encourage pt to call with questions or concerns. Received verbal understanding. 

## 2014-10-06 NOTE — Progress Notes (Signed)
Patient Care Team: Laurey Morale, MD as PCP - General Excell Seltzer, MD as Consulting Physician (General Surgery) Nicholas Lose, MD as Consulting Physician (Hematology and Oncology) Arloa Koh, MD as Consulting Physician (Radiation Oncology) Holley Bouche, NP as Nurse Practitioner (Nurse Practitioner)  DIAGNOSIS: Breast cancer of upper-outer quadrant of right female breast   Staging form: Breast, AJCC 7th Edition     Clinical stage from 04/01/2014: Stage IIB (T3, N0, M0) - Unsigned       Staging comments: Staged at breast conference on 12.2.15      Pathologic stage from 09/30/2014: yT0, N0, cM0 - Unsigned       Staging comments: Staged on final lumpectomy specimen by Dr. Avis Epley    SUMMARY OF ONCOLOGIC HISTORY:   Breast cancer of upper-outer quadrant of right female breast   03/18/2014 Mammogram Right breast suspicious mass 10:00 4.9 x 3 x 5 cm, 2 lower right axillary lymph nodes mildly thickened cortices   03/23/2014 Initial Biopsy Right breast needle biopsy 9:00: Invasive ductal carcinoma grade 3, ER PR HER-2 negative ratio 1.3   03/31/2014 Breast MRI Right breast mass 9:00 upper outer quadrant with contiguous skin involvement by 5.7 cm, right axillary lymph nodes normal in size   04/10/2014 - 08/26/2014 Neo-Adjuvant Chemotherapy Dose dense Adriamycin and Cytoxan 4 followed by weekly Taxol 12 after week 1 Taxol changed to Abraxane   08/28/2014 Breast MRI Decrease in the size of the right breast mass from 5.7 cm to 8 mm, skin enhancement is no longer seen, decreased in size of axillary lymph nodes, excellent response to chemotherapy   09/29/2014 Surgery Rt Lumpectomy: Path CR, 0/2 LN    CHIEF COMPLIANT: Follow-up after surgery  INTERVAL HISTORY: Stephanie Olsen is a 69 year old with above-mentioned history of right breast cancer with new adjuvant chemotherapy and had a lumpectomy on 09/29/2014. General pathologic complete response. She is healing very well from surgery  standpoint. Slightly sore.  REVIEW OF SYSTEMS:   Constitutional: Denies fevers, chills or abnormal weight loss Eyes: Denies blurriness of vision Ears, nose, mouth, throat, and face: Denies mucositis or sore throat Respiratory: Denies cough, dyspnea or wheezes Cardiovascular: Denies palpitation, chest discomfort or lower extremity swelling Gastrointestinal:  Denies nausea, heartburn or change in bowel habits Skin: Denies abnormal skin rashes Lymphatics: Denies new lymphadenopathy or easy bruising Neurological:Denies numbness, tingling or new weaknesses Behavioral/Psych: Mood is stable, no new changes  Breast:  denies any pain or lumps or nodules in either breasts All other systems were reviewed with the patient and are negative.  I have reviewed the past medical history, past surgical history, social history and family history with the patient and they are unchanged from previous note.  ALLERGIES:  has No Known Allergies.  MEDICATIONS:  Current Outpatient Prescriptions  Medication Sig Dispense Refill  . Ascorbic Acid (VITAMIN C) 500 MG tablet Take 500 mg by mouth 2 (two) times daily.     Marland Kitchen aspirin 81 MG tablet Take 81 mg by mouth every morning.     . Calcium Carb-Cholecalciferol (CALCIUM 600/VITAMIN D3) 600-800 MG-UNIT TABS Take 1 tablet by mouth every morning.    . carvedilol (COREG) 3.125 MG tablet Take 1 tablet (3.125 mg total) by mouth 2 (two) times daily. 180 tablet 3  . cetirizine (ZYRTEC) 10 MG tablet Take 10 mg by mouth every morning.     . clonazePAM (KLONOPIN) 2 MG tablet Take 1 tablet (2 mg total) by mouth 2 (two) times daily as needed for anxiety. (Patient  taking differently: Take 1 mg by mouth 2 (two) times daily as needed for anxiety. ) 180 tablet 1  . cyclobenzaprine (FLEXERIL) 10 MG tablet Take 1 tablet (10 mg total) by mouth 3 (three) times daily as needed for muscle spasms. For spasms 90 tablet 5  . fish oil-omega-3 fatty acids 1000 MG capsule Take 1 g by mouth every  morning.     . fluticasone (FLONASE) 50 MCG/ACT nasal spray INSTILL 2 SPRAYS INTO THE NOSE DAILY 48 g 1  . furosemide (LASIX) 20 MG tablet Take 1 tablet (20 mg total) by mouth daily. 30 tablet 1  . HYDROcodone-acetaminophen (NORCO/VICODIN) 5-325 MG per tablet Take 1-2 tablets by mouth every 4 (four) hours as needed for moderate pain or severe pain. 25 tablet 0  . HYDROcodone-acetaminophen (NORCO/VICODIN) 5-325 MG per tablet Take 1-2 tablets by mouth every 4 (four) hours as needed for moderate pain or severe pain. 40 tablet 0  . ibuprofen (ADVIL,MOTRIN) 200 MG tablet Take 200 mg by mouth every 6 (six) hours as needed for headache or mild pain.    Marland Kitchen latanoprost (XALATAN) 0.005 % ophthalmic solution Place 1 drop into both eyes at bedtime.    Marland Kitchen lisinopril (PRINIVIL,ZESTRIL) 10 MG tablet Take 1 tablet (10 mg total) by mouth daily. 90 tablet 3  . Magnesium 500 MG TABS Take 1 tablet by mouth at bedtime.    . metoCLOPramide (REGLAN) 10 MG tablet   5  . metroNIDAZOLE (METROGEL) 0.75 % gel Apply 1 application topically 2 (two) times daily as needed (rosacea on face.).     Marland Kitchen Multiple Vitamins-Minerals (PRESERVISION AREDS 2 PO) Take by mouth.    . timolol (BETIMOL) 0.5 % ophthalmic solution Place 1 drop into both eyes every morning.    Marland Kitchen UNABLE TO FIND Apply 1 each topically as needed. 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 1 each 1   No current facility-administered medications for this visit.   Facility-Administered Medications Ordered in Other Visits  Medication Dose Route Frequency Provider Last Rate Last Dose  . sodium chloride 0.9 % injection 10 mL  10 mL Intracatheter PRN Nicholas Lose, MD   10 mL at 06/17/14 1635    PHYSICAL EXAMINATION: ECOG PERFORMANCE STATUS: 1 - Symptomatic but completely ambulatory  Filed Vitals:   10/06/14 1045  BP: 134/59  Pulse: 68  Temp: 98 F (36.7 C)  Resp: 20   Filed Weights   10/06/14 1045  Weight: 191 lb  14.4 oz (87.045 kg)    GENERAL:alert, no distress and comfortable SKIN: skin color, texture, turgor are normal, no rashes or significant lesions EYES: normal, Conjunctiva are pink and non-injected, sclera clear OROPHARYNX:no exudate, no erythema and lips, buccal mucosa, and tongue normal  NECK: supple, thyroid normal size, non-tender, without nodularity LYMPH:  no palpable lymphadenopathy in the cervical, axillary or inguinal LUNGS: clear to auscultation and percussion with normal breathing effort HEART: regular rate & rhythm and no murmurs and no lower extremity edema ABDOMEN:abdomen soft, non-tender and normal bowel sounds Musculoskeletal:no cyanosis of digits and no clubbing  NEURO: alert & oriented x 3 with fluent speech, no focal motor/sensory deficits BREAST: No palpable masses or nodules in either right or left breasts. No palpable axillary supraclavicular or infraclavicular adenopathy no breast tenderness or nipple discharge. (exam performed in the presence of a chaperone)  LABORATORY DATA:  I have reviewed the data as listed   Chemistry      Component Value Date/Time   NA  137 08/26/2014 0853   NA 134* 02/12/2014 1108   K 4.0 08/26/2014 0853   K 4.7 02/12/2014 1108   CL 100 02/12/2014 1108   CO2 19* 08/26/2014 0853   CO2 25 02/12/2014 1108   BUN 9.8 08/26/2014 0853   BUN 10 02/12/2014 1108   CREATININE 0.7 08/26/2014 0853   CREATININE 0.8 02/12/2014 1108      Component Value Date/Time   CALCIUM 9.3 08/26/2014 0853   CALCIUM 9.5 02/12/2014 1108   ALKPHOS 69 08/26/2014 0853   ALKPHOS 71 02/12/2014 1108   AST 16 08/26/2014 0853   AST 22 02/12/2014 1108   ALT 13 08/26/2014 0853   ALT 22 02/12/2014 1108   BILITOT 0.38 08/26/2014 0853   BILITOT 0.5 02/12/2014 1108       Lab Results  Component Value Date   WBC 5.1 08/26/2014   HGB 13.0 09/29/2014   HCT 35.1 08/26/2014   MCV 89.0 08/26/2014   PLT 328 08/26/2014   NEUTROABS 3.2 08/26/2014     ASSESSMENT &  PLAN:  Breast cancer of upper-outer quadrant of right female breast Right breast invasive ductal carcinoma, palpable mass, 5 cm by mammogram and 5.7 cm by MRI grade 3, ER PR HER-2 negative, associated skin dimpling, T3, N0, M0 stage IIB Ki-67 90%  Chemotherapy summary: Neoadjuvant chemotherapy with dose dense Adriamycin and Cytoxan 04/10/2014. followed by weekly Taxol x1 changed to Abraxane 11 from 06/17/2014 completed 08/26/2014 Rt Lumpectomy: 09/29/14: Path CR 0/2 LN  Plan: 1. Adjuvant XRT 2. Followed by Observation  Cardiomyopathy: follows cardiology  RTC in 6 months    No orders of the defined types were placed in this encounter.   The patient has a good understanding of the overall plan. she agrees with it. she will call with any problems that may develop before the next visit here.   Rulon Eisenmenger, MD

## 2014-10-06 NOTE — Telephone Encounter (Signed)
Called and left a message with her 57mo appointment

## 2014-10-13 ENCOUNTER — Ambulatory Visit: Payer: Medicare Other | Admitting: Physical Therapy

## 2014-10-19 ENCOUNTER — Encounter: Payer: Self-pay | Admitting: Radiation Oncology

## 2014-10-19 NOTE — Progress Notes (Signed)
Location of Breast Cancer:Right Breast, Upper-Outer Quadrant  Histology per Pathology Report: 09/29/14 Diagnosis 1. Breast, lumpectomy, right - BENIGN BREAST AND FIBROFATTY SOFT TISSUE. - HEMORRHAGE, NECROSIS, FAT NECROSIS, AND BIOPSY SITE CHANGES IDENTIFIED. - FIBROCYSTIC CHANGES. - NO ATYPIA OR VIABLE TUMOR SEEN. - SEE COMMENT. 2. Lymph node, sentinel, biopsy, Right axillary #1 - ONE BENIGN LYMPH NODE WITH NO TUMOR SEEN (0/1). - SEE COMMENT. 3. Lymph node, sentinel, biopsy, Right axillary #2 - ONE BENIGN LYMPH NODE WITH NO TUMOR SEEN (0/1). - SEE COMMENT  03/23/14 Diagnosis Lymph node, needle/core biopsy, right axillary - BENIGN FIBROADIPOSE TISSUE. - LYMPH NODAL TISSUE IS NOT IDENTIFIED. - THERE IS NO EVIDENCE OF MALIGNANCY.  03/23/14 Diagnosis Breast, right, needle core biopsy, 9:00 - INVASIVE DUCTAL CARCINOMA.  Receptor Status: ER(0), PR (0), Her2-neu (No Amp), Ki-65(97%)  [name]. Stephanie Olsen felt a palpable mass in the upper-outer quadrant of the right breast. This led to a mammogram on 03/18/2014 that revealed a mass at 10:00 position measuring 5 cm. 2 axillary lymph nodes were noted to be abnormal. She underwent a biopsy 03/23/2014.pathology came back as invasive ductal carcinoma grade 3 triple negative disease. With a Ki-67 of 90%. She underwent an MRI of the breast on 03/31/2014 that revealed a 5.7 cm mass with skin dimpling. Biopsy of the axillary lymph node did not reveal any malignancy  Past/Anticipated interventions by surgeon, if any:Dr. Excell Seltzer - Lumpectomy right breast  Past/Anticipated interventions by medical oncology, if any: Dr. Nicholas Lose- Neoadjuvant chemotherapy with dose dense Adriamycin Cytoxan every 2 weeks x4 followed by Taxol weekly x1 then changed to Abraxane 11 from  06/17/2014 completed 08/26/2014  Followed by surgery and radiation.    Lymphedema issues, if any: None  Pain issues, if any: No  SAFETY ISSUES:  Prior radiation?  NO  Pacemaker/ICD? NO  Possible current pregnancy?No  Is the patient on methotrexate? No  Current Complaints / other details:    She menarched at early age of 58 and half and went to menopause at age 45  She had 1 pregnancy, her first child was born at age 62  She has received birth control pills for approximately 2-3 years.  She was never exposed to fertility medications or hormone replacement therapy.  She has no family history of Breast/GYN/GI cancer    Stephanie Olsen, Crista Curb, RN 10/19/2014,7:00 PM

## 2014-10-22 ENCOUNTER — Ambulatory Visit
Admission: RE | Admit: 2014-10-22 | Discharge: 2014-10-22 | Disposition: A | Discharge status: Home / Self Care | Payer: Medicare Other | Source: Ambulatory Visit | Attending: Radiation Oncology | Admitting: Radiation Oncology

## 2014-10-22 ENCOUNTER — Encounter: Payer: Self-pay | Admitting: Radiation Oncology

## 2014-10-22 VITALS — BP 126/70 | HR 69 | Temp 98.2°F | Ht 66.0 in | Wt 191.2 lb

## 2014-10-22 DIAGNOSIS — Z510 Encounter for antineoplastic radiation therapy: Secondary | ICD-10-CM | POA: Diagnosis not present

## 2014-10-22 DIAGNOSIS — H4011X2 Primary open-angle glaucoma, moderate stage: Secondary | ICD-10-CM | POA: Diagnosis not present

## 2014-10-22 DIAGNOSIS — C50411 Malignant neoplasm of upper-outer quadrant of right female breast: Secondary | ICD-10-CM

## 2014-10-22 DIAGNOSIS — C50911 Malignant neoplasm of unspecified site of right female breast: Secondary | ICD-10-CM | POA: Diagnosis not present

## 2014-10-22 NOTE — Addendum Note (Signed)
Encounter addended by: Benn Moulder, RN on: 10/22/2014  1:15 PM<BR>     Documentation filed: Charges VN

## 2014-10-22 NOTE — Progress Notes (Addendum)
CC: Dr. Adonis Housekeeper, Dr. Delma Freeze, Dr. Nicholas Lose  Follow-up note:  Diagnosis: Clinical stage IIB (T3 N0 M0), pathologic stage (ypT0, N0)  History: Stephanie Olsen visits today for review and scheduling of her radiation therapy in the management of her initial clinical stage IIB (T3 N0) triple negative carcinoma of the right breast.  I initially saw her at the multidisciplinary clinic on 04/01/2014. She noted an enlarging mass along the upper outer quadrant of the right breast approximately 3 weeks ago. She underwent mammography and ultrasonography which showed a 5.0 x 4.9 cm mass at 10:00. There were some questionably abnormal appearing lymph nodes within the axilla on ultrasound. On 03/23/2014 she underwent a biopsy of the right breast mass and also an axillary lymph node. The breast mass was diagnostic for grade 3 invasive ductal carcinoma, triple negative. Ki-67 was 90%. The axillary lymph node did not show lymphoid tissue and was felt to be benign. MRI scan on 03/31/2014 showed a 5.7 cm mass with what was felt to be perhaps reactive lymph nodes within the axilla. There was overlying skin thickening/dimpling.After her staging workup she went on to receive new adjuvant chemotherapy with dose dense before meals 4 followed by weekly Taxol, changing to Abraxane after 1 week.  On 09/29/2014 showed underwent a right partial mastectomy and sentinel lymph node biopsy.  There was no residual tumor within the right breast.  2 sentinel lymph nodes were free of metastatic disease.  She is  quite pleased with her pathologic complete response.  She does report slight right breast and axillary discomfort.  Physical examination: Alert and oriented. Filed Vitals:   10/22/14 0835  BP: 126/70  Pulse: 69  Temp: 98.2 F (36.8 C)   Head and neck examination: She wears a wig.  Nodes: There is no palpable cervical, supraclavicular, or axillary lymphadenopathy.  Chest: Lungs clear.  Breasts: There is a  partial mastectomy wound along the upper-outer quadrant of the right breast extending from 9 to 11:00.  The wound is well-healed.  No discreet masses are appreciated.  Left breast without masses or lesions.  Extremities: Without edema.  Laboratory data: Lab Results  Component Value Date   WBC 5.1 08/26/2014   HGB 13.0 09/29/2014   HCT 35.1 08/26/2014   MCV 89.0 08/26/2014   PLT 328 08/26/2014   Impression:  : Clinical stage IIB (T3 N0 M0), pathologic stage (ypT0, N0) We discussed the fact that she had a complete response is a an excellent prognostic factor.  We again discussed her local management, and she is a candidate for breast preservation.  We discussed the potential acute and late toxicities of radiation therapy.  She will be treated with standard fractionation to the entire breast, no boost.  Consent is signed today.  She will return in the near future for  CT simulation.  Plan: As discussed above.  30 minutes was spent face-to-face with the patient, primarily counseling patient and coordinating her care.  Marland Kitchen

## 2014-10-22 NOTE — Addendum Note (Signed)
Encounter addended by: Benn Moulder, RN on: 10/22/2014  3:28 PM<BR>     Documentation filed: Arn Medal VN

## 2014-11-03 ENCOUNTER — Ambulatory Visit
Admission: RE | Admit: 2014-11-03 | Discharge: 2014-11-03 | Disposition: A | Discharge status: Home / Self Care | Payer: Medicare Other | Source: Ambulatory Visit | Attending: Radiation Oncology | Admitting: Radiation Oncology

## 2014-11-03 DIAGNOSIS — Z510 Encounter for antineoplastic radiation therapy: Secondary | ICD-10-CM | POA: Diagnosis not present

## 2014-11-03 DIAGNOSIS — C50411 Malignant neoplasm of upper-outer quadrant of right female breast: Secondary | ICD-10-CM | POA: Diagnosis not present

## 2014-11-03 DIAGNOSIS — C50911 Malignant neoplasm of unspecified site of right female breast: Secondary | ICD-10-CM | POA: Diagnosis not present

## 2014-11-03 NOTE — Progress Notes (Signed)
Complex simulation/treatment planning note: The patient was taken to the CT simulator and placed supine.  A Vac lock immobilization device was constructed on a custom breast board.  Her right breast was marked with radiopaque wires along with her partial mastectomy scar.  She was then scanned.  I chose an isocenter.  The CT data set was sent to the planning system right contoured her tumor bed.  Dosimetry will contour the remainder of her anatomy including her lungs and heart.  She was set up to tangential fields with 2 sets of unique MLCs for medial and lateral right breast tangents.  I'm prescribing 5040 cGy in 28 sessions.  No boost.

## 2014-11-04 ENCOUNTER — Encounter: Payer: Self-pay | Admitting: Radiation Oncology

## 2014-11-04 DIAGNOSIS — Z510 Encounter for antineoplastic radiation therapy: Secondary | ICD-10-CM | POA: Diagnosis not present

## 2014-11-04 DIAGNOSIS — C50411 Malignant neoplasm of upper-outer quadrant of right female breast: Secondary | ICD-10-CM | POA: Diagnosis not present

## 2014-11-04 DIAGNOSIS — C50911 Malignant neoplasm of unspecified site of right female breast: Secondary | ICD-10-CM | POA: Diagnosis not present

## 2014-11-04 NOTE — Progress Notes (Signed)
3-D simulation note: The patient completed 3-D simulation for treatment to her right breast.  She was set up to medial lateral right breast tangents.  Dose volume histograms were obtained for the lungs and heart.  We met our departmental guidelines.  She is being treated with for unique MLCs/electronic compensation fields to deliver 5040 cGy in 28 sessions.  She is being treated with 10 MV photons.

## 2014-11-05 ENCOUNTER — Telehealth: Payer: Self-pay | Admitting: *Deleted

## 2014-11-05 ENCOUNTER — Other Ambulatory Visit: Payer: Self-pay | Admitting: *Deleted

## 2014-11-05 DIAGNOSIS — C50411 Malignant neoplasm of upper-outer quadrant of right female breast: Secondary | ICD-10-CM

## 2014-11-05 MED ORDER — FUROSEMIDE 20 MG PO TABS: 20.0000 mg | ORAL_TABLET | Freq: Every day | ORAL | Status: DC

## 2014-11-05 NOTE — Telephone Encounter (Signed)
Refill for lasix called to pharmacy. Patient also called.

## 2014-11-05 NOTE — Telephone Encounter (Signed)
Patient is currently out of her lasix prescription. Patient feet swelling is still present, and would like to know if you want to continue with her lasix prescription. If so, RN can re-order. Message sent to MD Hanley Seamen.

## 2014-11-10 ENCOUNTER — Ambulatory Visit
Admission: RE | Admit: 2014-11-10 | Discharge: 2014-11-10 | Disposition: A | Discharge status: Home / Self Care | Payer: Medicare Other | Source: Ambulatory Visit | Attending: Radiation Oncology | Admitting: Radiation Oncology

## 2014-11-10 DIAGNOSIS — C50911 Malignant neoplasm of unspecified site of right female breast: Secondary | ICD-10-CM | POA: Diagnosis not present

## 2014-11-10 DIAGNOSIS — Z510 Encounter for antineoplastic radiation therapy: Secondary | ICD-10-CM | POA: Diagnosis not present

## 2014-11-10 DIAGNOSIS — C50411 Malignant neoplasm of upper-outer quadrant of right female breast: Secondary | ICD-10-CM | POA: Diagnosis not present

## 2014-11-11 ENCOUNTER — Ambulatory Visit
Admission: RE | Admit: 2014-11-11 | Discharge: 2014-11-11 | Disposition: A | Discharge status: Home / Self Care | Payer: Medicare Other | Source: Ambulatory Visit | Attending: Radiation Oncology | Admitting: Radiation Oncology

## 2014-11-11 DIAGNOSIS — Z510 Encounter for antineoplastic radiation therapy: Secondary | ICD-10-CM | POA: Diagnosis not present

## 2014-11-11 DIAGNOSIS — C50911 Malignant neoplasm of unspecified site of right female breast: Secondary | ICD-10-CM | POA: Diagnosis not present

## 2014-11-11 DIAGNOSIS — C50411 Malignant neoplasm of upper-outer quadrant of right female breast: Secondary | ICD-10-CM | POA: Diagnosis not present

## 2014-11-12 ENCOUNTER — Ambulatory Visit
Admission: RE | Admit: 2014-11-12 | Discharge: 2014-11-12 | Disposition: A | Discharge status: Home / Self Care | Payer: Medicare Other | Source: Ambulatory Visit | Attending: Radiation Oncology | Admitting: Radiation Oncology

## 2014-11-12 DIAGNOSIS — C50411 Malignant neoplasm of upper-outer quadrant of right female breast: Secondary | ICD-10-CM | POA: Diagnosis not present

## 2014-11-12 DIAGNOSIS — C50911 Malignant neoplasm of unspecified site of right female breast: Secondary | ICD-10-CM | POA: Diagnosis not present

## 2014-11-12 DIAGNOSIS — Z510 Encounter for antineoplastic radiation therapy: Secondary | ICD-10-CM | POA: Diagnosis not present

## 2014-11-13 ENCOUNTER — Ambulatory Visit
Admission: RE | Admit: 2014-11-13 | Discharge: 2014-11-13 | Disposition: A | Discharge status: Home / Self Care | Payer: Medicare Other | Source: Ambulatory Visit | Attending: Radiation Oncology | Admitting: Radiation Oncology

## 2014-11-13 DIAGNOSIS — C50411 Malignant neoplasm of upper-outer quadrant of right female breast: Secondary | ICD-10-CM | POA: Diagnosis not present

## 2014-11-13 DIAGNOSIS — Z510 Encounter for antineoplastic radiation therapy: Secondary | ICD-10-CM | POA: Diagnosis not present

## 2014-11-13 DIAGNOSIS — C50911 Malignant neoplasm of unspecified site of right female breast: Secondary | ICD-10-CM | POA: Diagnosis not present

## 2014-11-16 ENCOUNTER — Encounter: Payer: Self-pay | Admitting: Radiation Oncology

## 2014-11-16 ENCOUNTER — Ambulatory Visit
Admission: RE | Admit: 2014-11-16 | Discharge: 2014-11-16 | Disposition: A | Discharge status: Home / Self Care | Payer: Medicare Other | Source: Ambulatory Visit | Attending: Radiation Oncology | Admitting: Radiation Oncology

## 2014-11-16 VITALS — BP 120/48 | HR 64 | Temp 98.2°F | Ht 66.0 in | Wt 193.7 lb

## 2014-11-16 DIAGNOSIS — C50411 Malignant neoplasm of upper-outer quadrant of right female breast: Secondary | ICD-10-CM | POA: Diagnosis not present

## 2014-11-16 DIAGNOSIS — Z510 Encounter for antineoplastic radiation therapy: Secondary | ICD-10-CM | POA: Insufficient documentation

## 2014-11-16 DIAGNOSIS — C50911 Malignant neoplasm of unspecified site of right female breast: Secondary | ICD-10-CM | POA: Diagnosis not present

## 2014-11-16 MED ORDER — RADIAPLEXRX EX GEL
Freq: Once | CUTANEOUS | Status: AC
  Administered 2014-11-16: 12:00:00 via TOPICAL

## 2014-11-16 MED ORDER — ALRA NON-METALLIC DEODORANT (RAD-ONC)
1.0000 "application " | Freq: Once | TOPICAL | Status: AC
  Administered 2014-11-16: 1 via TOPICAL

## 2014-11-16 NOTE — Progress Notes (Signed)
Ms. Carneiro has received 5 fractions to her right breast.  No voiced concerns today.

## 2014-11-16 NOTE — Progress Notes (Addendum)
Pt here for patient teaching.  Pt given Radiation and You booklet, skin care instructions, Alra deodorant and Radiaplex gel. Reviewed areas of pertinence such as fatigue, skin changes, breast tenderness and breast swelling . Pt able to give teach back of to pat skin and use unscented/gentle soap,apply Radiaplex bid, avoid applying anything to skin within 4 hours of treatment, avoid wearing an under wire bra and to use an electric razor if they must shave. Pt demonstrated understanding of information given and will contact nursing with any questions or concerns.  Reminder that Dr. Valere Dross wioll examine her every Monday after her treatment, however, if he is out of clinic she will be seen by his partner, Dr. Eppie Gibson and she will be notified in advance. Teachback.

## 2014-11-16 NOTE — Addendum Note (Signed)
Encounter addended by: Benn Moulder, RN on: 11/16/2014  1:11 PM<BR>     Documentation filed: [employer], Orders

## 2014-11-16 NOTE — Addendum Note (Signed)
Encounter addended by: Benn Moulder, RN on: 11/16/2014  1:09 PM<BR>     Documentation filed: Inpatient Patient Education, Notes Section

## 2014-11-16 NOTE — Addendum Note (Signed)
Encounter addended by: Benn Moulder, RN on: 11/16/2014  1:13 PM<BR>     Documentation filed: Inpatient MAR

## 2014-11-16 NOTE — Progress Notes (Signed)
Weekly Management Note:  Site: Right breast Current Dose:  720  cGy Projected Dose: 5040  cGy  Narrative: The patient is seen today for routine under treatment assessment. CBCT/MVCT images/port films were reviewed. The chart was reviewed.   She is without complaints today.  Physical Examination:  Filed Vitals:   11/16/14 1139  BP: 120/48  Pulse: 64  Temp: 98.2 F (36.8 C)  .  Weight: 193 lb 11.2 oz (87.862 kg).  No significant skin changes.  Impression: Tolerating radiation therapy well.  Plan: Continue radiation therapy as planned.

## 2014-11-17 ENCOUNTER — Telehealth: Payer: Self-pay | Admitting: *Deleted

## 2014-11-17 ENCOUNTER — Ambulatory Visit
Admission: RE | Admit: 2014-11-17 | Discharge: 2014-11-17 | Disposition: A | Discharge status: Home / Self Care | Payer: Medicare Other | Source: Ambulatory Visit | Attending: Radiation Oncology | Admitting: Radiation Oncology

## 2014-11-17 DIAGNOSIS — Z510 Encounter for antineoplastic radiation therapy: Secondary | ICD-10-CM | POA: Diagnosis not present

## 2014-11-17 DIAGNOSIS — C50411 Malignant neoplasm of upper-outer quadrant of right female breast: Secondary | ICD-10-CM | POA: Diagnosis not present

## 2014-11-17 DIAGNOSIS — C50911 Malignant neoplasm of unspecified site of right female breast: Secondary | ICD-10-CM | POA: Diagnosis not present

## 2014-11-17 NOTE — Telephone Encounter (Signed)
Left vm for pt to return call to assess needs during xrt

## 2014-11-17 NOTE — Telephone Encounter (Signed)
Spoke to pt concerning needs during xrt. Relate doing well and is without complaints. Encourage pt to call with questions or concerns. Received verbal understanding.

## 2014-11-18 ENCOUNTER — Ambulatory Visit
Admission: RE | Admit: 2014-11-18 | Discharge: 2014-11-18 | Disposition: A | Discharge status: Home / Self Care | Payer: Medicare Other | Source: Ambulatory Visit | Attending: Radiation Oncology | Admitting: Radiation Oncology

## 2014-11-18 DIAGNOSIS — C50411 Malignant neoplasm of upper-outer quadrant of right female breast: Secondary | ICD-10-CM | POA: Diagnosis not present

## 2014-11-18 DIAGNOSIS — C50911 Malignant neoplasm of unspecified site of right female breast: Secondary | ICD-10-CM | POA: Diagnosis not present

## 2014-11-18 DIAGNOSIS — Z510 Encounter for antineoplastic radiation therapy: Secondary | ICD-10-CM | POA: Diagnosis not present

## 2014-11-19 ENCOUNTER — Ambulatory Visit
Admission: RE | Admit: 2014-11-19 | Discharge: 2014-11-19 | Disposition: A | Discharge status: Home / Self Care | Payer: Medicare Other | Source: Ambulatory Visit | Attending: Radiation Oncology | Admitting: Radiation Oncology

## 2014-11-19 DIAGNOSIS — C50911 Malignant neoplasm of unspecified site of right female breast: Secondary | ICD-10-CM | POA: Diagnosis not present

## 2014-11-19 DIAGNOSIS — Z510 Encounter for antineoplastic radiation therapy: Secondary | ICD-10-CM | POA: Diagnosis not present

## 2014-11-19 DIAGNOSIS — C50411 Malignant neoplasm of upper-outer quadrant of right female breast: Secondary | ICD-10-CM | POA: Diagnosis not present

## 2014-11-20 ENCOUNTER — Ambulatory Visit
Admission: RE | Admit: 2014-11-20 | Discharge: 2014-11-20 | Disposition: A | Discharge status: Home / Self Care | Payer: Medicare Other | Source: Ambulatory Visit | Attending: Radiation Oncology | Admitting: Radiation Oncology

## 2014-11-20 DIAGNOSIS — C50911 Malignant neoplasm of unspecified site of right female breast: Secondary | ICD-10-CM | POA: Diagnosis not present

## 2014-11-20 DIAGNOSIS — Z510 Encounter for antineoplastic radiation therapy: Secondary | ICD-10-CM | POA: Diagnosis not present

## 2014-11-20 DIAGNOSIS — C50411 Malignant neoplasm of upper-outer quadrant of right female breast: Secondary | ICD-10-CM | POA: Diagnosis not present

## 2014-11-23 ENCOUNTER — Encounter: Payer: Self-pay | Admitting: Radiation Oncology

## 2014-11-23 ENCOUNTER — Ambulatory Visit
Admission: RE | Admit: 2014-11-23 | Discharge: 2014-11-23 | Disposition: A | Discharge status: Home / Self Care | Payer: Medicare Other | Source: Ambulatory Visit | Attending: Radiation Oncology | Admitting: Radiation Oncology

## 2014-11-23 VITALS — BP 139/53 | HR 66 | Temp 98.4°F | Resp 12 | Wt 190.1 lb

## 2014-11-23 DIAGNOSIS — C50911 Malignant neoplasm of unspecified site of right female breast: Secondary | ICD-10-CM | POA: Diagnosis not present

## 2014-11-23 DIAGNOSIS — Z510 Encounter for antineoplastic radiation therapy: Secondary | ICD-10-CM | POA: Diagnosis not present

## 2014-11-23 DIAGNOSIS — C50411 Malignant neoplasm of upper-outer quadrant of right female breast: Secondary | ICD-10-CM | POA: Diagnosis not present

## 2014-11-23 NOTE — Progress Notes (Signed)
PAIN: She is currently in no pain. SKIN: Pt right breast- positive for noted a small gray along incision line.  Pt denies edema.  Pt continues to apply Radiaplex as directed. OTHER: Pt complains of fatigue. BP 139/53 mmHg  Pulse 66  Temp(Src) 98.4 F (36.9 C) (Oral)  Resp 12  Wt 190 lb 1.6 oz (86.229 kg)  SpO2 98% Wt Readings from Last 3 Encounters:  11/23/14 190 lb 1.6 oz (86.229 kg)  11/16/14 193 lb 11.2 oz (87.862 kg)  10/22/14 191 lb 3.2 oz (86.728 kg)

## 2014-11-23 NOTE — Progress Notes (Signed)
Weekly Management Note:  Site: right breast Current Dose:   1620  cGy Projected Dose:  5040  cGy  Narrative: The patient is seen today for routine under treatment assessment. CBCT/MVCT images/port films were reviewed. The chart was reviewed.    She is without complaints today except for mild fatigue. She uses Radioplex gel.  Physical Examination:  Filed Vitals:   11/23/14 1108  BP: 139/53  Pulse: 66  Temp: 98.4 F (36.9 C)  Resp: 12  .  Weight: 190 lb 1.6 oz (86.229 kg).  There is slight erythema along the right breast with no areas of desquamation.  Impression: Tolerating radiation therapy well.  Plan: Continue radiation therapy as planned.

## 2014-11-24 ENCOUNTER — Ambulatory Visit
Admission: RE | Admit: 2014-11-24 | Discharge: 2014-11-24 | Disposition: A | Discharge status: Home / Self Care | Payer: Medicare Other | Source: Ambulatory Visit | Attending: Radiation Oncology | Admitting: Radiation Oncology

## 2014-11-24 DIAGNOSIS — C50411 Malignant neoplasm of upper-outer quadrant of right female breast: Secondary | ICD-10-CM | POA: Diagnosis not present

## 2014-11-24 DIAGNOSIS — C50911 Malignant neoplasm of unspecified site of right female breast: Secondary | ICD-10-CM | POA: Diagnosis not present

## 2014-11-24 DIAGNOSIS — Z510 Encounter for antineoplastic radiation therapy: Secondary | ICD-10-CM | POA: Diagnosis not present

## 2014-11-25 ENCOUNTER — Ambulatory Visit
Admission: RE | Admit: 2014-11-25 | Discharge: 2014-11-25 | Disposition: A | Discharge status: Home / Self Care | Payer: Medicare Other | Source: Ambulatory Visit | Attending: Radiation Oncology | Admitting: Radiation Oncology

## 2014-11-25 DIAGNOSIS — C50911 Malignant neoplasm of unspecified site of right female breast: Secondary | ICD-10-CM | POA: Diagnosis not present

## 2014-11-25 DIAGNOSIS — C50411 Malignant neoplasm of upper-outer quadrant of right female breast: Secondary | ICD-10-CM | POA: Diagnosis not present

## 2014-11-25 DIAGNOSIS — Z510 Encounter for antineoplastic radiation therapy: Secondary | ICD-10-CM | POA: Diagnosis not present

## 2014-11-26 ENCOUNTER — Ambulatory Visit
Admission: RE | Admit: 2014-11-26 | Discharge: 2014-11-26 | Disposition: A | Discharge status: Home / Self Care | Payer: Medicare Other | Source: Ambulatory Visit | Attending: Radiation Oncology | Admitting: Radiation Oncology

## 2014-11-26 DIAGNOSIS — C50411 Malignant neoplasm of upper-outer quadrant of right female breast: Secondary | ICD-10-CM | POA: Diagnosis not present

## 2014-11-26 DIAGNOSIS — Z510 Encounter for antineoplastic radiation therapy: Secondary | ICD-10-CM | POA: Diagnosis not present

## 2014-11-26 DIAGNOSIS — C50911 Malignant neoplasm of unspecified site of right female breast: Secondary | ICD-10-CM | POA: Diagnosis not present

## 2014-11-27 ENCOUNTER — Ambulatory Visit
Admission: RE | Admit: 2014-11-27 | Discharge: 2014-11-27 | Disposition: A | Discharge status: Home / Self Care | Payer: Medicare Other | Source: Ambulatory Visit | Attending: Radiation Oncology | Admitting: Radiation Oncology

## 2014-11-27 DIAGNOSIS — Z510 Encounter for antineoplastic radiation therapy: Secondary | ICD-10-CM | POA: Diagnosis not present

## 2014-11-27 DIAGNOSIS — C50411 Malignant neoplasm of upper-outer quadrant of right female breast: Secondary | ICD-10-CM | POA: Diagnosis not present

## 2014-11-27 DIAGNOSIS — C50911 Malignant neoplasm of unspecified site of right female breast: Secondary | ICD-10-CM | POA: Diagnosis not present

## 2014-11-30 ENCOUNTER — Ambulatory Visit
Admission: RE | Admit: 2014-11-30 | Discharge: 2014-11-30 | Disposition: A | Discharge status: Home / Self Care | Payer: Medicare Other | Source: Ambulatory Visit | Attending: Radiation Oncology | Admitting: Radiation Oncology

## 2014-11-30 DIAGNOSIS — C50411 Malignant neoplasm of upper-outer quadrant of right female breast: Secondary | ICD-10-CM | POA: Diagnosis not present

## 2014-11-30 DIAGNOSIS — C50911 Malignant neoplasm of unspecified site of right female breast: Secondary | ICD-10-CM | POA: Diagnosis not present

## 2014-11-30 DIAGNOSIS — Z510 Encounter for antineoplastic radiation therapy: Secondary | ICD-10-CM | POA: Diagnosis not present

## 2014-12-01 ENCOUNTER — Ambulatory Visit
Admission: RE | Admit: 2014-12-01 | Discharge: 2014-12-01 | Disposition: A | Discharge status: Home / Self Care | Payer: Medicare Other | Source: Ambulatory Visit | Attending: Radiation Oncology | Admitting: Radiation Oncology

## 2014-12-01 ENCOUNTER — Encounter: Payer: Self-pay | Admitting: Radiation Oncology

## 2014-12-01 VITALS — BP 105/64 | HR 69 | Temp 97.8°F | Resp 20 | Wt 190.4 lb

## 2014-12-01 DIAGNOSIS — C50411 Malignant neoplasm of upper-outer quadrant of right female breast: Secondary | ICD-10-CM | POA: Diagnosis not present

## 2014-12-01 DIAGNOSIS — Z510 Encounter for antineoplastic radiation therapy: Secondary | ICD-10-CM | POA: Diagnosis not present

## 2014-12-01 DIAGNOSIS — C50911 Malignant neoplasm of unspecified site of right female breast: Secondary | ICD-10-CM | POA: Diagnosis not present

## 2014-12-01 MED ORDER — RADIAPLEXRX EX GEL
Freq: Once | CUTANEOUS | Status: AC
  Administered 2014-12-01: 10:00:00 via TOPICAL

## 2014-12-01 NOTE — Progress Notes (Signed)
Weekly Management Note:  Site: Right breast Current Dose:  2700  cGy Projected Dose: 5040  cGy  Narrative: The patient is seen today for routine under treatment assessment. CBCT/MVCT images/port films were reviewed. The chart was reviewed.   She is without complaints today.  She uses Radioplex gel.  Physical Examination:  Filed Vitals:   12/01/14 0943  BP: 105/64  Pulse: 69  Temp: 97.8 F (36.6 C)  Resp: 20  .  Weight: 190 lb 6.4 oz (86.365 kg).  There is mild erythema along the right breast with no areas of desquamation.  Impression: Tolerating radiation therapy well.  Plan: Continue radiation therapy as planned.

## 2014-12-01 NOTE — Progress Notes (Addendum)
Weekly rad txs right breast 15/28 completed, some erythema slight,skin intact,  Using radiaplex bid, needs another tube, and given, no pain,appetite good  9:47 AM BP 105/64 mmHg  Pulse 69  Temp(Src) 97.8 F (36.6 C) (Oral)  Resp 20  Wt 190 lb 6.4 oz (86.365 kg)  Wt Readings from Last 3 Encounters:  12/01/14 190 lb 6.4 oz (86.365 kg)  11/23/14 190 lb 1.6 oz (86.229 kg)  11/16/14 193 lb 11.2 oz (87.862 kg)

## 2014-12-02 ENCOUNTER — Ambulatory Visit
Admission: RE | Admit: 2014-12-02 | Discharge: 2014-12-02 | Disposition: A | Discharge status: Home / Self Care | Payer: Medicare Other | Source: Ambulatory Visit | Attending: Radiation Oncology | Admitting: Radiation Oncology

## 2014-12-02 DIAGNOSIS — Z510 Encounter for antineoplastic radiation therapy: Secondary | ICD-10-CM | POA: Diagnosis not present

## 2014-12-02 DIAGNOSIS — C50411 Malignant neoplasm of upper-outer quadrant of right female breast: Secondary | ICD-10-CM | POA: Diagnosis not present

## 2014-12-02 DIAGNOSIS — C50911 Malignant neoplasm of unspecified site of right female breast: Secondary | ICD-10-CM | POA: Diagnosis not present

## 2014-12-03 ENCOUNTER — Ambulatory Visit
Admission: RE | Admit: 2014-12-03 | Discharge: 2014-12-03 | Disposition: A | Discharge status: Home / Self Care | Payer: Medicare Other | Source: Ambulatory Visit | Attending: Radiation Oncology | Admitting: Radiation Oncology

## 2014-12-03 DIAGNOSIS — Z510 Encounter for antineoplastic radiation therapy: Secondary | ICD-10-CM | POA: Diagnosis not present

## 2014-12-03 DIAGNOSIS — C50911 Malignant neoplasm of unspecified site of right female breast: Secondary | ICD-10-CM | POA: Diagnosis not present

## 2014-12-03 DIAGNOSIS — C50411 Malignant neoplasm of upper-outer quadrant of right female breast: Secondary | ICD-10-CM | POA: Diagnosis not present

## 2014-12-04 ENCOUNTER — Ambulatory Visit
Admission: RE | Admit: 2014-12-04 | Discharge: 2014-12-04 | Disposition: A | Discharge status: Home / Self Care | Payer: Medicare Other | Source: Ambulatory Visit | Attending: Radiation Oncology | Admitting: Radiation Oncology

## 2014-12-04 DIAGNOSIS — C50411 Malignant neoplasm of upper-outer quadrant of right female breast: Secondary | ICD-10-CM | POA: Diagnosis not present

## 2014-12-04 DIAGNOSIS — Z510 Encounter for antineoplastic radiation therapy: Secondary | ICD-10-CM | POA: Diagnosis not present

## 2014-12-04 DIAGNOSIS — C50911 Malignant neoplasm of unspecified site of right female breast: Secondary | ICD-10-CM | POA: Diagnosis not present

## 2014-12-07 ENCOUNTER — Ambulatory Visit
Admission: RE | Admit: 2014-12-07 | Discharge: 2014-12-07 | Disposition: A | Discharge status: Home / Self Care | Payer: Medicare Other | Source: Ambulatory Visit | Attending: Radiation Oncology | Admitting: Radiation Oncology

## 2014-12-07 VITALS — BP 128/63 | HR 65 | Temp 98.0°F | Resp 12 | Wt 192.3 lb

## 2014-12-07 DIAGNOSIS — C50411 Malignant neoplasm of upper-outer quadrant of right female breast: Secondary | ICD-10-CM | POA: Diagnosis not present

## 2014-12-07 DIAGNOSIS — Z510 Encounter for antineoplastic radiation therapy: Secondary | ICD-10-CM | POA: Diagnosis not present

## 2014-12-07 DIAGNOSIS — C50911 Malignant neoplasm of unspecified site of right female breast: Secondary | ICD-10-CM | POA: Diagnosis not present

## 2014-12-07 NOTE — Progress Notes (Signed)
   Weekly Management Note:  outpatient    ICD-9-CM ICD-10-CM   1. Breast cancer of upper-outer quadrant of right female breast 174.4 C50.411     Current Dose:  34.2 Gy  Projected Dose: 50.4 Gy  initial  Narrative:  The patient presents for routine under treatment assessment.  CBCT/MVCT images/Port film x-rays were reviewed.  The chart was checked. Has some UIQ right breast itching  Physical Findings:  weight is 192 lb 4.8 oz (87.227 kg). Her oral temperature is 98 F (36.7 C). Her blood pressure is 128/63 and her pulse is 65. Her respiration is 12 and oxygen saturation is 98%.   Wt Readings from Last 3 Encounters:  12/07/14 192 lb 4.8 oz (87.227 kg)  12/01/14 190 lb 6.4 oz (86.365 kg)  11/23/14 190 lb 1.6 oz (86.229 kg)   NAD, dermatitis in R breast UIQ; erythema; skin intact  Impression:  The patient is tolerating radiotherapy.  Plan:  Continue radiotherapy as planned. May try hydrocortisone 1% cream in UIQ  ________________________________   Eppie Gibson, M.D.

## 2014-12-07 NOTE — Progress Notes (Signed)
PAIN: She is currently in no pain.  SKIN: Pt right breast- positive for Dryness, Pruritus and erythema. Gray appearance to right nipple.  Pt denies edema.  Pt continues to apply Radiaplex as directed. OTHER: Pt complains of fatigue. BP 128/63 mmHg  Pulse 65  Temp(Src) 98 F (36.7 C) (Oral)  Resp 12  Wt 192 lb 4.8 oz (87.227 kg)  SpO2 98% Wt Readings from Last 3 Encounters:  12/07/14 192 lb 4.8 oz (87.227 kg)  12/01/14 190 lb 6.4 oz (86.365 kg)  11/23/14 190 lb 1.6 oz (86.229 kg)

## 2014-12-08 ENCOUNTER — Ambulatory Visit
Admission: RE | Admit: 2014-12-08 | Discharge: 2014-12-08 | Disposition: A | Discharge status: Home / Self Care | Payer: Medicare Other | Source: Ambulatory Visit | Attending: Radiation Oncology | Admitting: Radiation Oncology

## 2014-12-08 DIAGNOSIS — Z510 Encounter for antineoplastic radiation therapy: Secondary | ICD-10-CM | POA: Diagnosis not present

## 2014-12-08 DIAGNOSIS — C50911 Malignant neoplasm of unspecified site of right female breast: Secondary | ICD-10-CM | POA: Diagnosis not present

## 2014-12-08 DIAGNOSIS — C50411 Malignant neoplasm of upper-outer quadrant of right female breast: Secondary | ICD-10-CM | POA: Diagnosis not present

## 2014-12-09 ENCOUNTER — Ambulatory Visit
Admission: RE | Admit: 2014-12-09 | Discharge: 2014-12-09 | Disposition: A | Discharge status: Home / Self Care | Payer: Medicare Other | Source: Ambulatory Visit | Attending: Radiation Oncology | Admitting: Radiation Oncology

## 2014-12-09 DIAGNOSIS — C50411 Malignant neoplasm of upper-outer quadrant of right female breast: Secondary | ICD-10-CM | POA: Diagnosis not present

## 2014-12-09 DIAGNOSIS — C50911 Malignant neoplasm of unspecified site of right female breast: Secondary | ICD-10-CM | POA: Diagnosis not present

## 2014-12-09 DIAGNOSIS — Z510 Encounter for antineoplastic radiation therapy: Secondary | ICD-10-CM | POA: Diagnosis not present

## 2014-12-10 ENCOUNTER — Ambulatory Visit
Admission: RE | Admit: 2014-12-10 | Discharge: 2014-12-10 | Disposition: A | Discharge status: Home / Self Care | Payer: Medicare Other | Source: Ambulatory Visit | Attending: Radiation Oncology | Admitting: Radiation Oncology

## 2014-12-10 DIAGNOSIS — C50911 Malignant neoplasm of unspecified site of right female breast: Secondary | ICD-10-CM | POA: Diagnosis not present

## 2014-12-10 DIAGNOSIS — Z510 Encounter for antineoplastic radiation therapy: Secondary | ICD-10-CM | POA: Diagnosis not present

## 2014-12-10 DIAGNOSIS — C50411 Malignant neoplasm of upper-outer quadrant of right female breast: Secondary | ICD-10-CM | POA: Diagnosis not present

## 2014-12-11 ENCOUNTER — Ambulatory Visit
Admission: RE | Admit: 2014-12-11 | Discharge: 2014-12-11 | Disposition: A | Discharge status: Home / Self Care | Payer: Medicare Other | Source: Ambulatory Visit | Attending: Radiation Oncology | Admitting: Radiation Oncology

## 2014-12-11 DIAGNOSIS — C50911 Malignant neoplasm of unspecified site of right female breast: Secondary | ICD-10-CM | POA: Diagnosis not present

## 2014-12-11 DIAGNOSIS — Z510 Encounter for antineoplastic radiation therapy: Secondary | ICD-10-CM | POA: Diagnosis not present

## 2014-12-11 DIAGNOSIS — C50411 Malignant neoplasm of upper-outer quadrant of right female breast: Secondary | ICD-10-CM | POA: Diagnosis not present

## 2014-12-14 ENCOUNTER — Encounter: Payer: Self-pay | Admitting: Radiation Oncology

## 2014-12-14 ENCOUNTER — Ambulatory Visit
Admission: RE | Admit: 2014-12-14 | Discharge: 2014-12-14 | Disposition: A | Discharge status: Home / Self Care | Payer: Medicare Other | Source: Ambulatory Visit | Attending: Radiation Oncology | Admitting: Radiation Oncology

## 2014-12-14 VITALS — BP 131/69 | HR 66 | Temp 98.6°F | Ht 66.0 in | Wt 191.4 lb

## 2014-12-14 DIAGNOSIS — C50411 Malignant neoplasm of upper-outer quadrant of right female breast: Secondary | ICD-10-CM | POA: Diagnosis not present

## 2014-12-14 DIAGNOSIS — Z510 Encounter for antineoplastic radiation therapy: Secondary | ICD-10-CM | POA: Diagnosis not present

## 2014-12-14 DIAGNOSIS — C50911 Malignant neoplasm of unspecified site of right female breast: Secondary | ICD-10-CM | POA: Diagnosis not present

## 2014-12-14 MED ORDER — BIAFINE EX EMUL
Freq: Once | CUTANEOUS | Status: AC
  Administered 2014-12-14: 15:00:00 via TOPICAL

## 2014-12-14 NOTE — Progress Notes (Signed)
Weekly Management Note:  Site: Right breast Current Dose:  4320  cGy Projected Dose: 5040  cGy  Narrative: The patient is seen today for routine under treatment assessment. CBCT/MVCT images/port films were reviewed. The chart was reviewed.   She she'll doing well although she has persistent pruritus along the area of papillary erythema within the upper inner quadrant of her right breast.  She has been using Radioplex gel and also hydrocortisone cream along the upper inner quadrant of the breast for pruritus.  Physical Examination:  Filed Vitals:   12/14/14 1418  BP: 131/69  Pulse: 66  Temp: 98.6 F (37 C)  .  Weight: 191 lb 6.4 oz (86.818 kg).  There is erythema along the right breast which is more papular along the upper inner quadrant.  There is patchy dry desquamation along the axilla and inframammary region.  Impression: Tolerating radiation therapy well with the expected degree of radiation dermatitis.  I think she may benefit from Biafine cream which is more moisturizing.  I will see her this Thursday before she finishes on Friday.  Plan: Continue radiation therapy as planned.

## 2014-12-14 NOTE — Progress Notes (Signed)
[name] has received 24 fractions to her right breast.  Note increased erythema of the tx field with rash-like appearance in the upper, inner quadrant of her right, with creppy skin/erythema in the right axilla and erythema in the inframmary fold.  She denies pain, but intermittent fatigue

## 2014-12-15 ENCOUNTER — Ambulatory Visit
Admission: RE | Admit: 2014-12-15 | Discharge: 2014-12-15 | Disposition: A | Discharge status: Home / Self Care | Payer: Medicare Other | Source: Ambulatory Visit | Attending: Radiation Oncology | Admitting: Radiation Oncology

## 2014-12-15 DIAGNOSIS — C50911 Malignant neoplasm of unspecified site of right female breast: Secondary | ICD-10-CM | POA: Diagnosis not present

## 2014-12-15 DIAGNOSIS — Z510 Encounter for antineoplastic radiation therapy: Secondary | ICD-10-CM | POA: Diagnosis not present

## 2014-12-15 DIAGNOSIS — C50411 Malignant neoplasm of upper-outer quadrant of right female breast: Secondary | ICD-10-CM | POA: Diagnosis not present

## 2014-12-16 ENCOUNTER — Ambulatory Visit
Admission: RE | Admit: 2014-12-16 | Discharge: 2014-12-16 | Disposition: A | Discharge status: Home / Self Care | Payer: Medicare Other | Source: Ambulatory Visit | Attending: Radiation Oncology | Admitting: Radiation Oncology

## 2014-12-16 DIAGNOSIS — Z510 Encounter for antineoplastic radiation therapy: Secondary | ICD-10-CM | POA: Diagnosis not present

## 2014-12-16 DIAGNOSIS — C50411 Malignant neoplasm of upper-outer quadrant of right female breast: Secondary | ICD-10-CM | POA: Diagnosis not present

## 2014-12-16 DIAGNOSIS — C50911 Malignant neoplasm of unspecified site of right female breast: Secondary | ICD-10-CM | POA: Diagnosis not present

## 2014-12-17 ENCOUNTER — Encounter: Payer: Self-pay | Admitting: Radiation Oncology

## 2014-12-17 ENCOUNTER — Ambulatory Visit
Admission: RE | Admit: 2014-12-17 | Discharge: 2014-12-17 | Disposition: A | Discharge status: Home / Self Care | Payer: Medicare Other | Source: Ambulatory Visit | Attending: Radiation Oncology | Admitting: Radiation Oncology

## 2014-12-17 DIAGNOSIS — C50911 Malignant neoplasm of unspecified site of right female breast: Secondary | ICD-10-CM | POA: Diagnosis not present

## 2014-12-17 DIAGNOSIS — C50411 Malignant neoplasm of upper-outer quadrant of right female breast: Secondary | ICD-10-CM | POA: Diagnosis not present

## 2014-12-17 DIAGNOSIS — Z510 Encounter for antineoplastic radiation therapy: Secondary | ICD-10-CM | POA: Diagnosis not present

## 2014-12-17 NOTE — Progress Notes (Signed)
Weekly Management Note:  Site: Right breast Current Dose:  4860  cGy Projected Dose: 5040  cGy  Narrative: The patient is seen today for routine under treatment assessment. CBCT/MVCT images/port films were reviewed. The chart was reviewed.   She is without new complaints today except for more discomfort along her axilla and inframammary region.  Physical Examination: There were no vitals filed for this visit..  Weight:  .  There is dry desquamation along her axilla and inframammary region which may become moist over the next one to 2 days.  Impression: Tolerating radiation therapy well although she is now having desquamation.  I told her that if she develops a moist disclamation then she is to apply triple robotic ointment.  She will finish her radiation therapy tomorrow.  Plan: One-month follow-up visit after completion of radiation therapy tomorrow.

## 2014-12-18 ENCOUNTER — Ambulatory Visit
Admission: RE | Admit: 2014-12-18 | Discharge: 2014-12-18 | Disposition: A | Discharge status: Home / Self Care | Payer: Medicare Other | Source: Ambulatory Visit | Attending: Radiation Oncology | Admitting: Radiation Oncology

## 2014-12-18 DIAGNOSIS — C50411 Malignant neoplasm of upper-outer quadrant of right female breast: Secondary | ICD-10-CM | POA: Diagnosis not present

## 2014-12-18 DIAGNOSIS — Z510 Encounter for antineoplastic radiation therapy: Secondary | ICD-10-CM | POA: Diagnosis not present

## 2014-12-18 DIAGNOSIS — C50911 Malignant neoplasm of unspecified site of right female breast: Secondary | ICD-10-CM | POA: Diagnosis not present

## 2014-12-20 ENCOUNTER — Encounter: Payer: Self-pay | Admitting: Radiation Oncology

## 2014-12-20 NOTE — Progress Notes (Signed)
[facility] End of Treatment Note  Name:Stephanie Olsen  Date: 12/20/2014 KYH:[id number] DOB:1945/05/15   Status:outpatient    CC: Laurey Morale, MD  Dr. Adonis Housekeeper  REFERRING PHYSICIAN: Dr. Adonis Housekeeper   DIAGNOSIS:  Clinical stage IIB (T3 N0 M0), pathologic stage (ypT0, N0)  INDICATION FOR TREATMENT: Curative  TREATMENT DATES: 11/11/2014 through 12/18/2014                           SITE/DOSE:   Right breast 5040 cGy in 28 sessions                         BEAMS/ENERGY:  Tangential fields with 10 MV photons (high tangents to cover the axilla)                NARRATIVE:  The patient tolerated treatment well although she developed dry desquamation of her skin along her right axilla and inframammary region by completion of therapy.  I cautioned her that this may develop into a moist desquamation, in which case, she should apply triple antibiotic ointment.                          PLAN: Routine followup in one month. Patient instructed to call if questions or worsening complaints in interim.

## 2014-12-21 ENCOUNTER — Telehealth: Payer: Self-pay | Admitting: *Deleted

## 2014-12-21 NOTE — Telephone Encounter (Signed)
Spoke to pt concerning final xrt. Relate doing well and without complaints. Gave encouragement and congratulations on completing xrt. Encourage pt to call with needs or questions. Received verbal understanding. Contact information given.

## 2014-12-24 ENCOUNTER — Encounter: Payer: Self-pay | Admitting: Radiation Oncology

## 2014-12-24 ENCOUNTER — Ambulatory Visit
Admission: RE | Admit: 2014-12-24 | Discharge: 2014-12-24 | Disposition: A | Discharge status: Home / Self Care | Payer: Medicare Other | Source: Ambulatory Visit | Attending: Radiation Oncology | Admitting: Radiation Oncology

## 2014-12-24 ENCOUNTER — Telehealth: Payer: Self-pay | Admitting: *Deleted

## 2014-12-24 ENCOUNTER — Other Ambulatory Visit: Payer: Self-pay | Admitting: Adult Health

## 2014-12-24 VITALS — BP 140/61 | HR 66 | Temp 98.1°F | Resp 20 | Ht 66.0 in | Wt 191.1 lb

## 2014-12-24 DIAGNOSIS — C50411 Malignant neoplasm of upper-outer quadrant of right female breast: Secondary | ICD-10-CM

## 2014-12-24 NOTE — Telephone Encounter (Signed)
Ms. Loney called with report that she feels that her the area on her right breast appears more swollen and painful since last assessment.  She has applied

## 2014-12-24 NOTE — Telephone Encounter (Signed)
CALLED PATIENT TO INFORM THAT DR. MURRAY WILL BE GLAD TO SEE HER TODAY @ 10:30 AM, PATIENT AGREED TO THIS

## 2014-12-24 NOTE — Progress Notes (Signed)
Follow up right breast 11/11/14-12/18/14 com,pletd, still has moist desquamation under inframmary fold right breast,dry desquamation under axilla, erythema , c/o using neosporin 3-4x day still hasn't helped, using radaiplex else.kwhere on breast 10:58 AM BP 140/61 mmHg  Pulse 66  Temp(Src) 98.1 F (36.7 C) (Oral)  Resp 20  Ht 5\' 6"  (1.676 m)  Wt 191 lb 1.6 oz (86.682 kg)  BMI 30.86 kg/m2  Wt Readings from Last 3 Encounters:  12/24/14 191 lb 1.6 oz (86.682 kg)  12/14/14 191 lb 6.4 oz (86.818 kg)  12/07/14 192 lb 4.8 oz (87.227 kg)

## 2014-12-24 NOTE — Progress Notes (Signed)
Clinic note:  Stephanie Olsen  stopped by today for a skin check.  She finished right breast radiation therapy approximately 1 week ago.  Following treatment she developed a small area of moist desquamation along the right inframammary region.  She has been applying antibiotic ointment.  On examination today there is a focal linear area of moist desquamation which appears to be healing well.  Impression: Radiation dermatitis with focal moist desquamation.  She is healing satisfactorily.  Plan: She will keep her follow-up visit with me in approximately 3 weeks.

## 2015-01-18 ENCOUNTER — Encounter: Payer: Self-pay | Admitting: Radiation Oncology

## 2015-01-19 ENCOUNTER — Ambulatory Visit
Admission: RE | Admit: 2015-01-19 | Discharge: 2015-01-19 | Disposition: A | Discharge status: Home / Self Care | Payer: Medicare Other | Source: Ambulatory Visit | Attending: Radiation Oncology | Admitting: Radiation Oncology

## 2015-01-19 DIAGNOSIS — C50411 Malignant neoplasm of upper-outer quadrant of right female breast: Secondary | ICD-10-CM

## 2015-01-19 HISTORY — DX: Personal history of irradiation: Z92.3

## 2015-01-19 NOTE — Progress Notes (Signed)
CC: Dr. Adonis Housekeeper  Follow-up note:  Stephanie Olsen  returns today approximately 1 month following completion of radiation therapy following new adjuvant chemotherapy and conservative surgery for triple negative clinical stage T3 N0 invasive ductal carcinoma of the right breast.  She is without complaints today.  She inquires about follow-up mammography in that mammography did not pick up her breast cancer, but he was found by palpation.  She will see Dr. Excell Seltzer in November or December.  She also maintains follow-up with Dr. Lindi Adie he'll see her on December 6.  Physical examination: Alert and oriented. Wt Readings from Last 3 Encounters:  12/24/14 191 lb 1.6 oz (86.682 kg)  12/14/14 191 lb 6.4 oz (86.818 kg)  12/07/14 192 lb 4.8 oz (87.227 kg)   Temp Readings from Last 3 Encounters:  12/24/14 98.1 F (36.7 C) Oral  12/14/14 98.6 F (37 C)   12/07/14 98 F (36.7 C) Oral   BP Readings from Last 3 Encounters:  12/24/14 140/61  12/14/14 131/69  12/07/14 128/63   Pulse Readings from Last 3 Encounters:  12/24/14 66  12/14/14 66  12/07/14 65   Head neck examination: Grossly unremarkable.  Nodes: Without palpable cervical, supraclavicular, or axillary lymphadenopathy.  Breasts: There is residual hyperpigmentation of the skin along the right breast with partial hyperpigmentation of her right nipple areolar complex.  There is minimal thickening of the right breast.  No masses are appreciated.  Left breast without masses or lesions.  Extremities: Without edema.  Impression: Satisfactory progress.  She'll maintain follow-up with Dr. Excell Seltzer and Dr. Lindi Adie.  They will make sure that she has follow-up mammography at the appropriate intervals.

## 2015-01-26 IMAGING — MG MM DIGITAL DIAGNOSTIC BILAT CAD
2 series · 2 of 2 positions shown · non-contrast
Comparison: 07/04/2013 and earlier

CLINICAL DATA: Palpable mass in the right breast.

EXAM:
DIGITAL DIAGNOSTIC  BILATERAL MAMMOGRAM WITH CAD
ULTRASOUND RIGHT BREAST

[L CC]
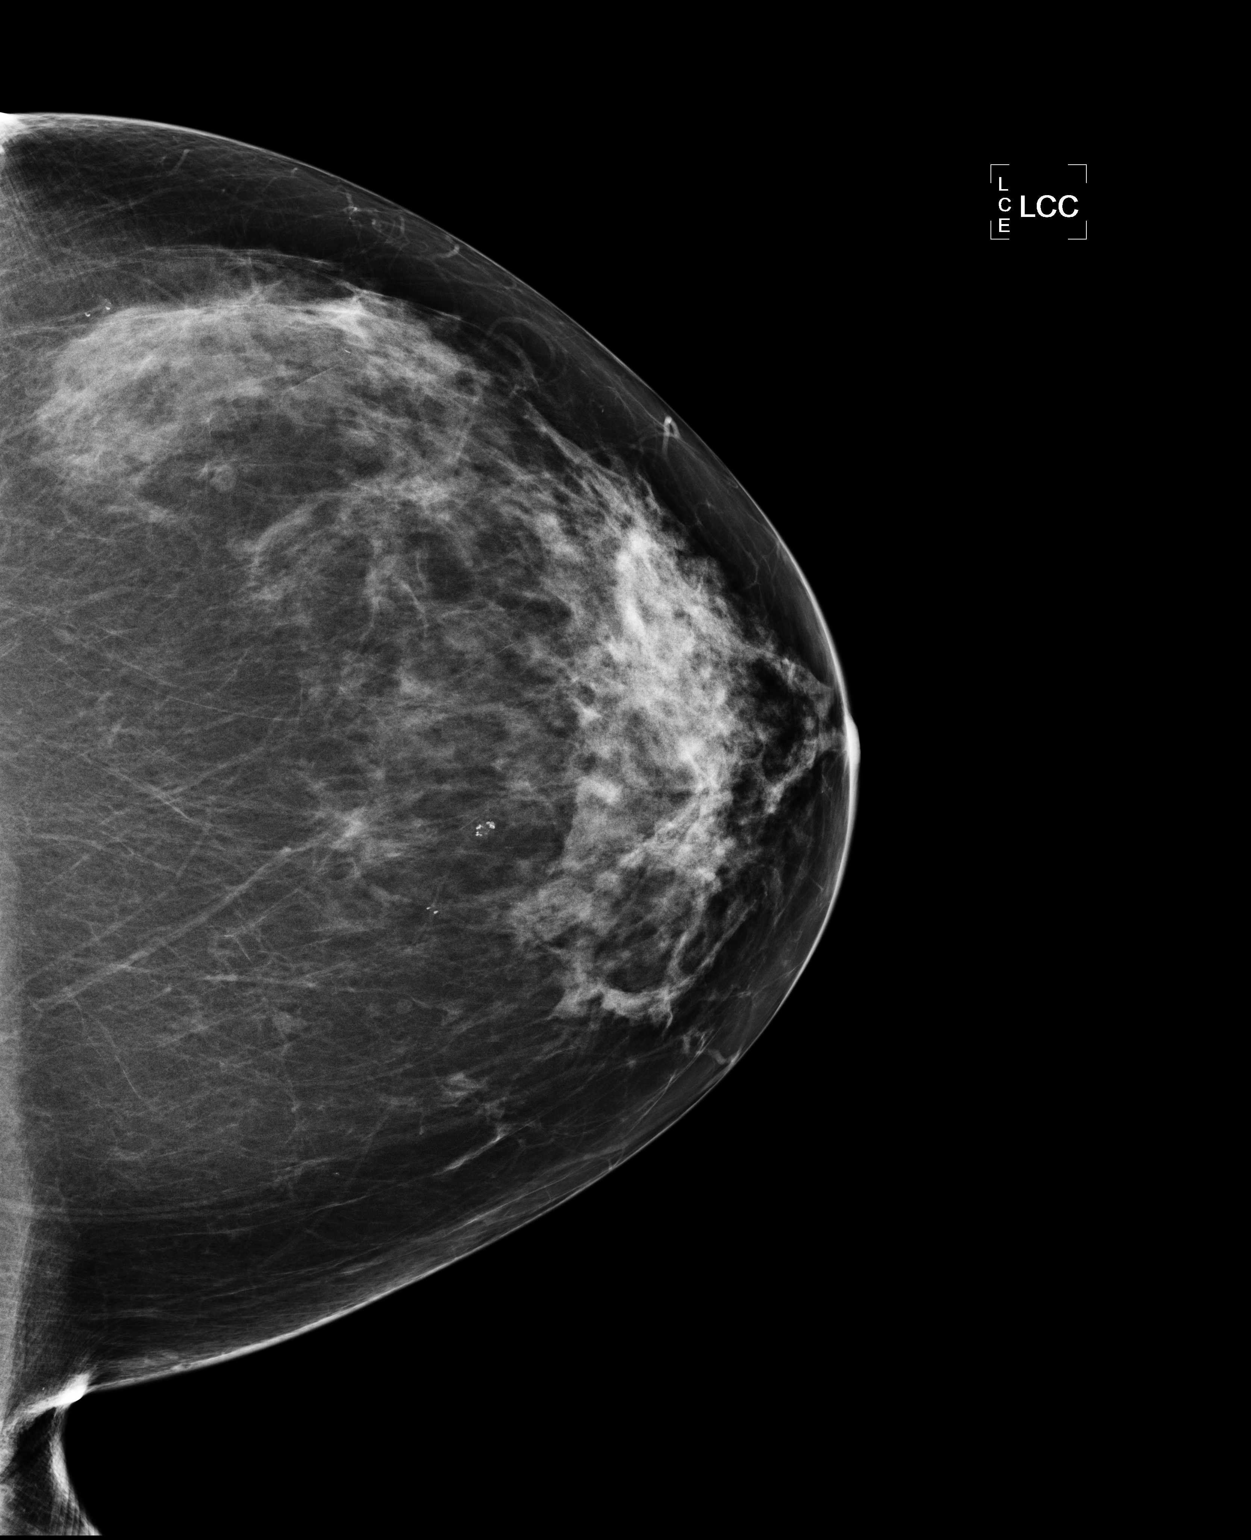

[L MLO]
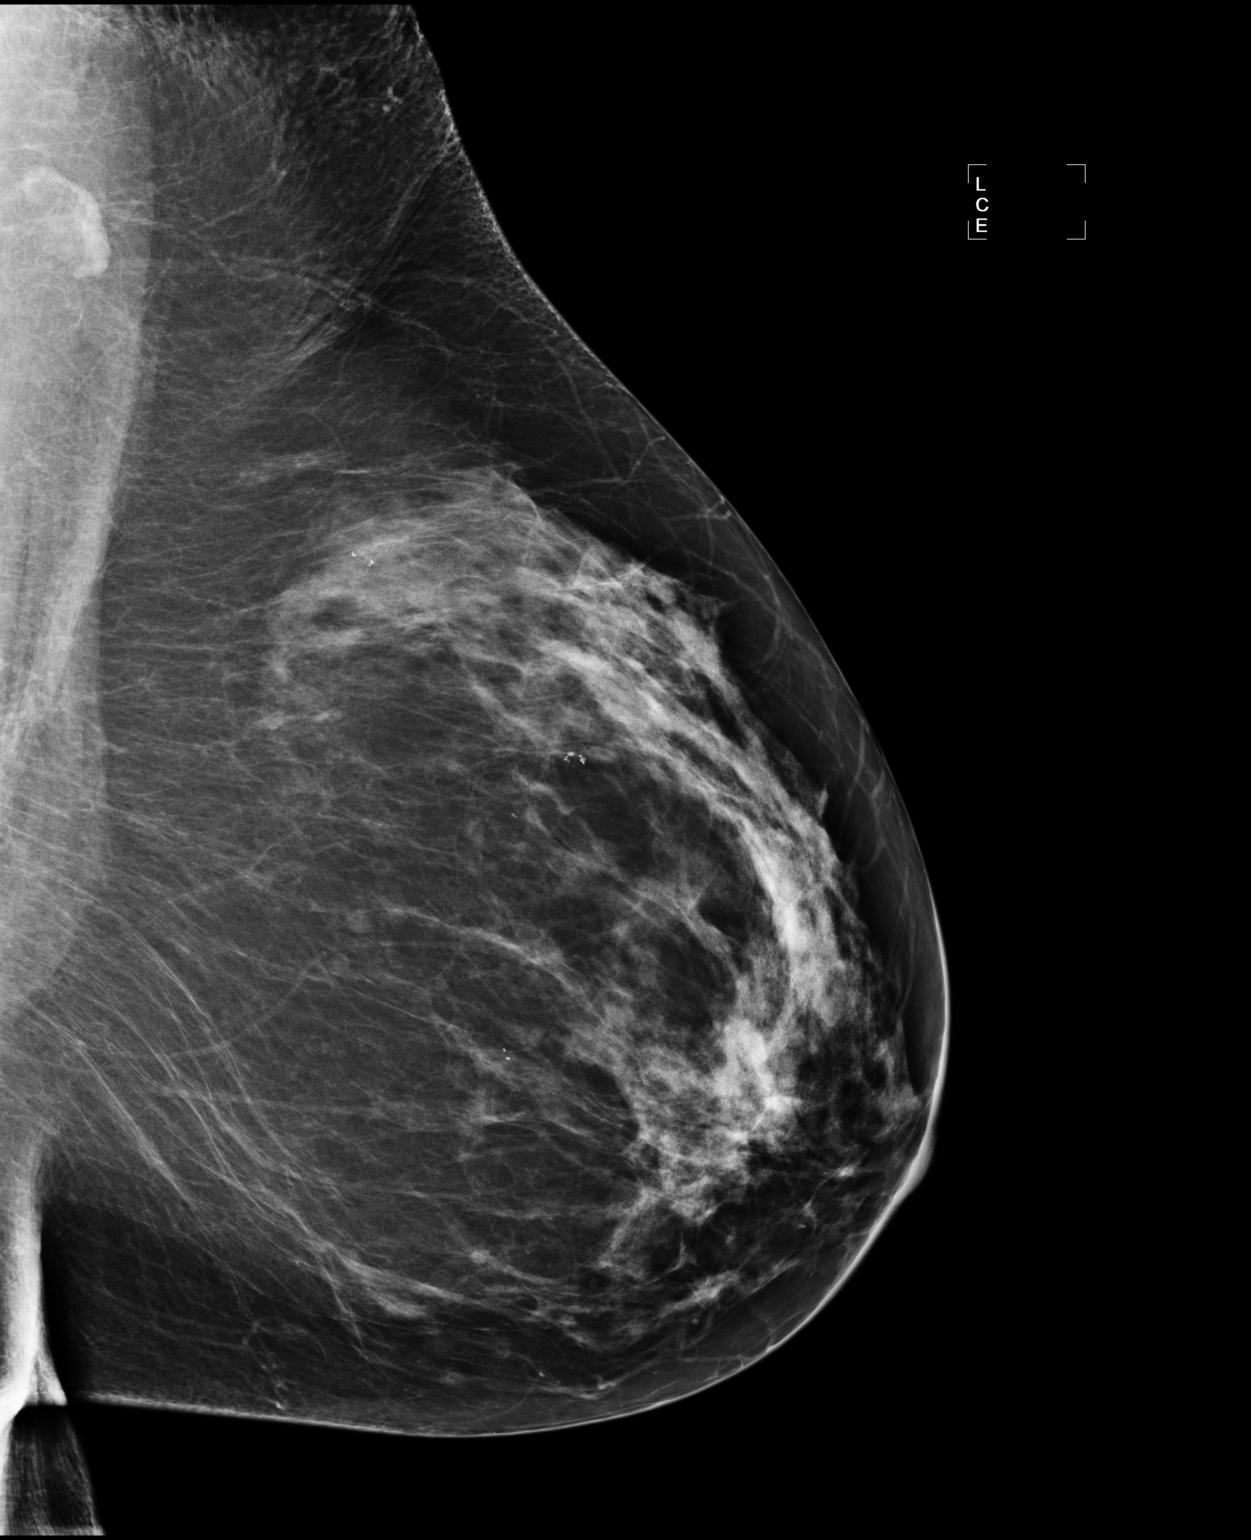

[2 of 2 positions shown; findings below may reference images not displayed]

ACR Breast Density Category c: The breast tissue is heterogeneously
dense, which may obscure small masses.
FINDINGS: There is an irregular spiculated mass in the upper-outer quadrant of
the right breast marked with a BB as palpable. This represents a
significant change since the prior study. Left breast is negative.

Mammographic images were processed with CAD.

On physical exam, I palpate a firm mass in the 10 o'clock location
of the right breast 4 cm from the nipple.

Ultrasound is performed, showing an irregular hypoechoic mass in the
10 o'clock location of the right breast 4 cm from the nipple. This
measures 4.9 x 3.0 x 5.0 cm. There is associated increased through
transmission. Internal vascularity is identified by Doppler
evaluation. Evaluation of the right axilla shows 2 lower axillary
lymph nodes with mildly thickened cortices. Other axillary lymph
nodes appear normal.
IMPRESSION: 1. Suspicious mass in 10 o'clock location of the right breast.
2. Two lower right axillary lymph nodes with mildly thickened
cortices.

RECOMMENDATION:
1. Biopsy of right breast mass 10 o'clock location.
2. Biopsy of 1 of the abnormal lower axillary lymph nodes.

I have discussed the findings and recommendations with the patient.
Results were also provided in writing at the conclusion of the
visit. If applicable, a reminder letter will be sent to the patient
regarding the next appointment.

BI-RADS CATEGORY  5: Highly suggestive of malignancy.

## 2015-01-31 IMAGING — MG MM DIGITAL DIAGNOSTIC UNILAT*R*
2 series · 2 of 2 positions shown · non-contrast
Comparison: 03/18/2014

CLINICAL DATA: Ultrasound-guided biopsy post clip placement

EXAM:
DIAGNOSTIC RIGHT MAMMOGRAM POST ULTRASOUND BIOPSY

[R CC]
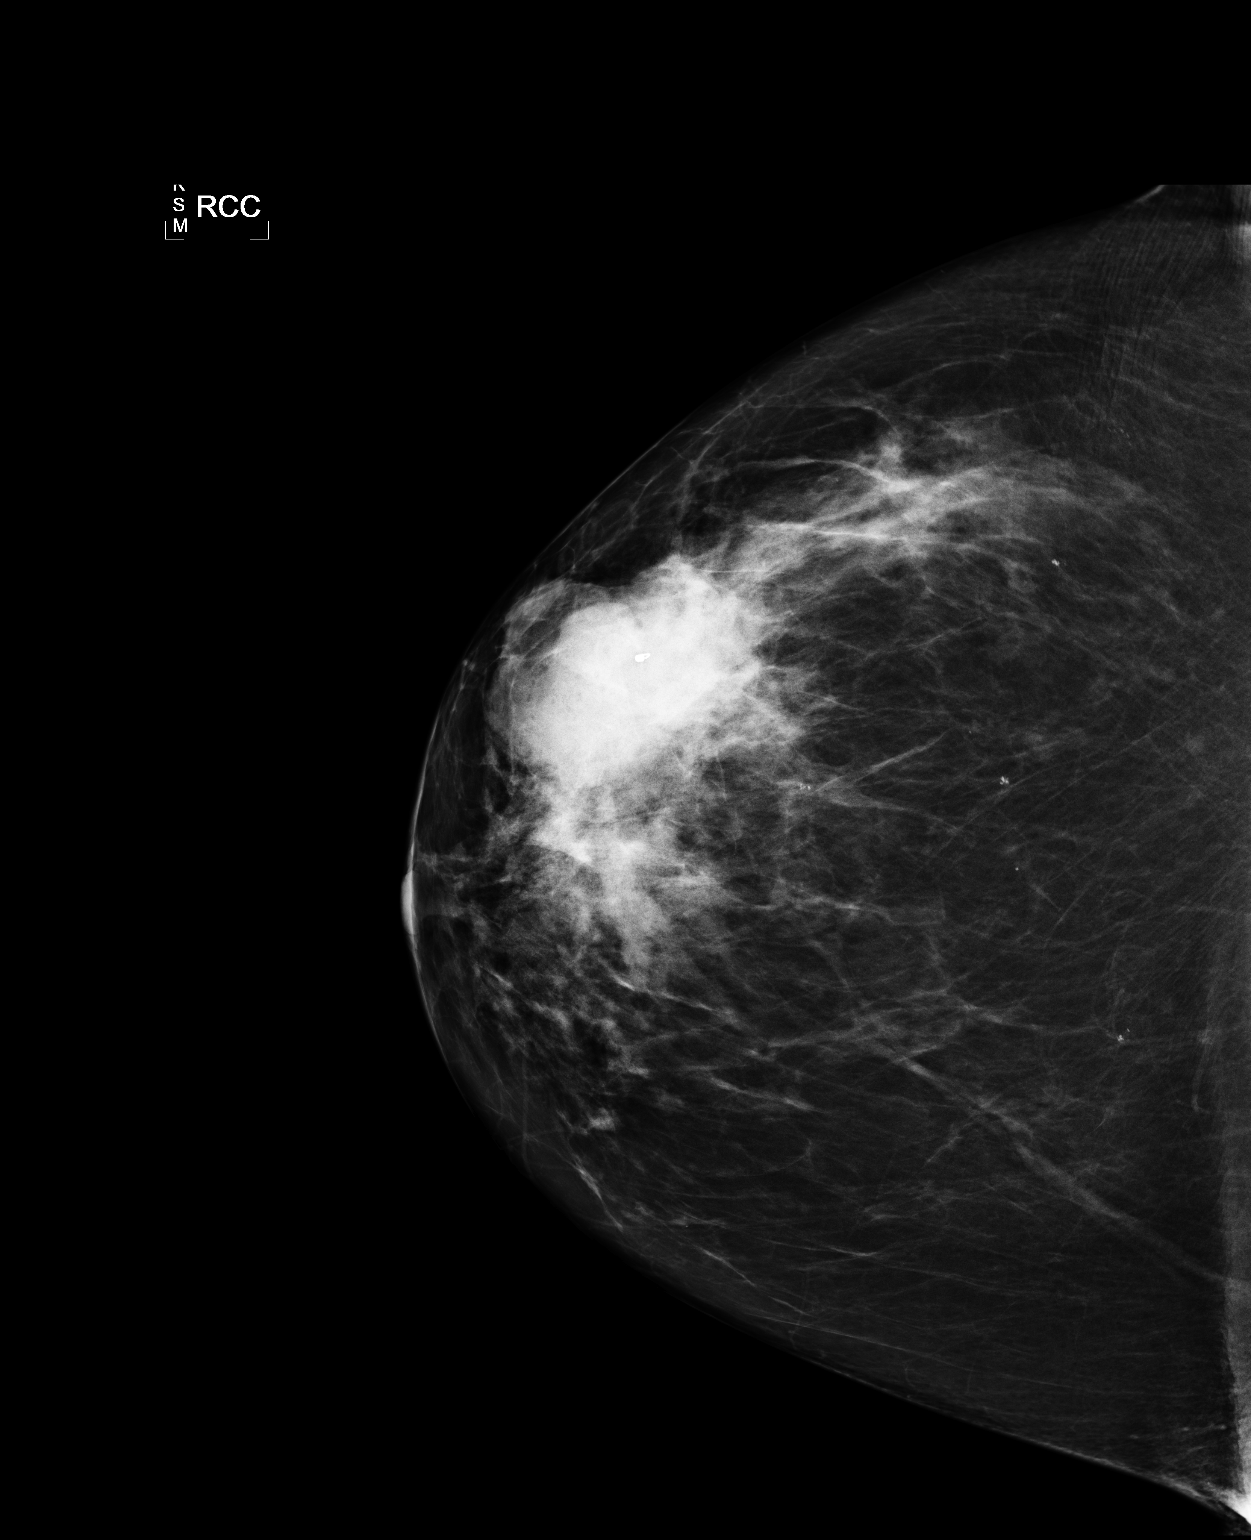

[R ML]
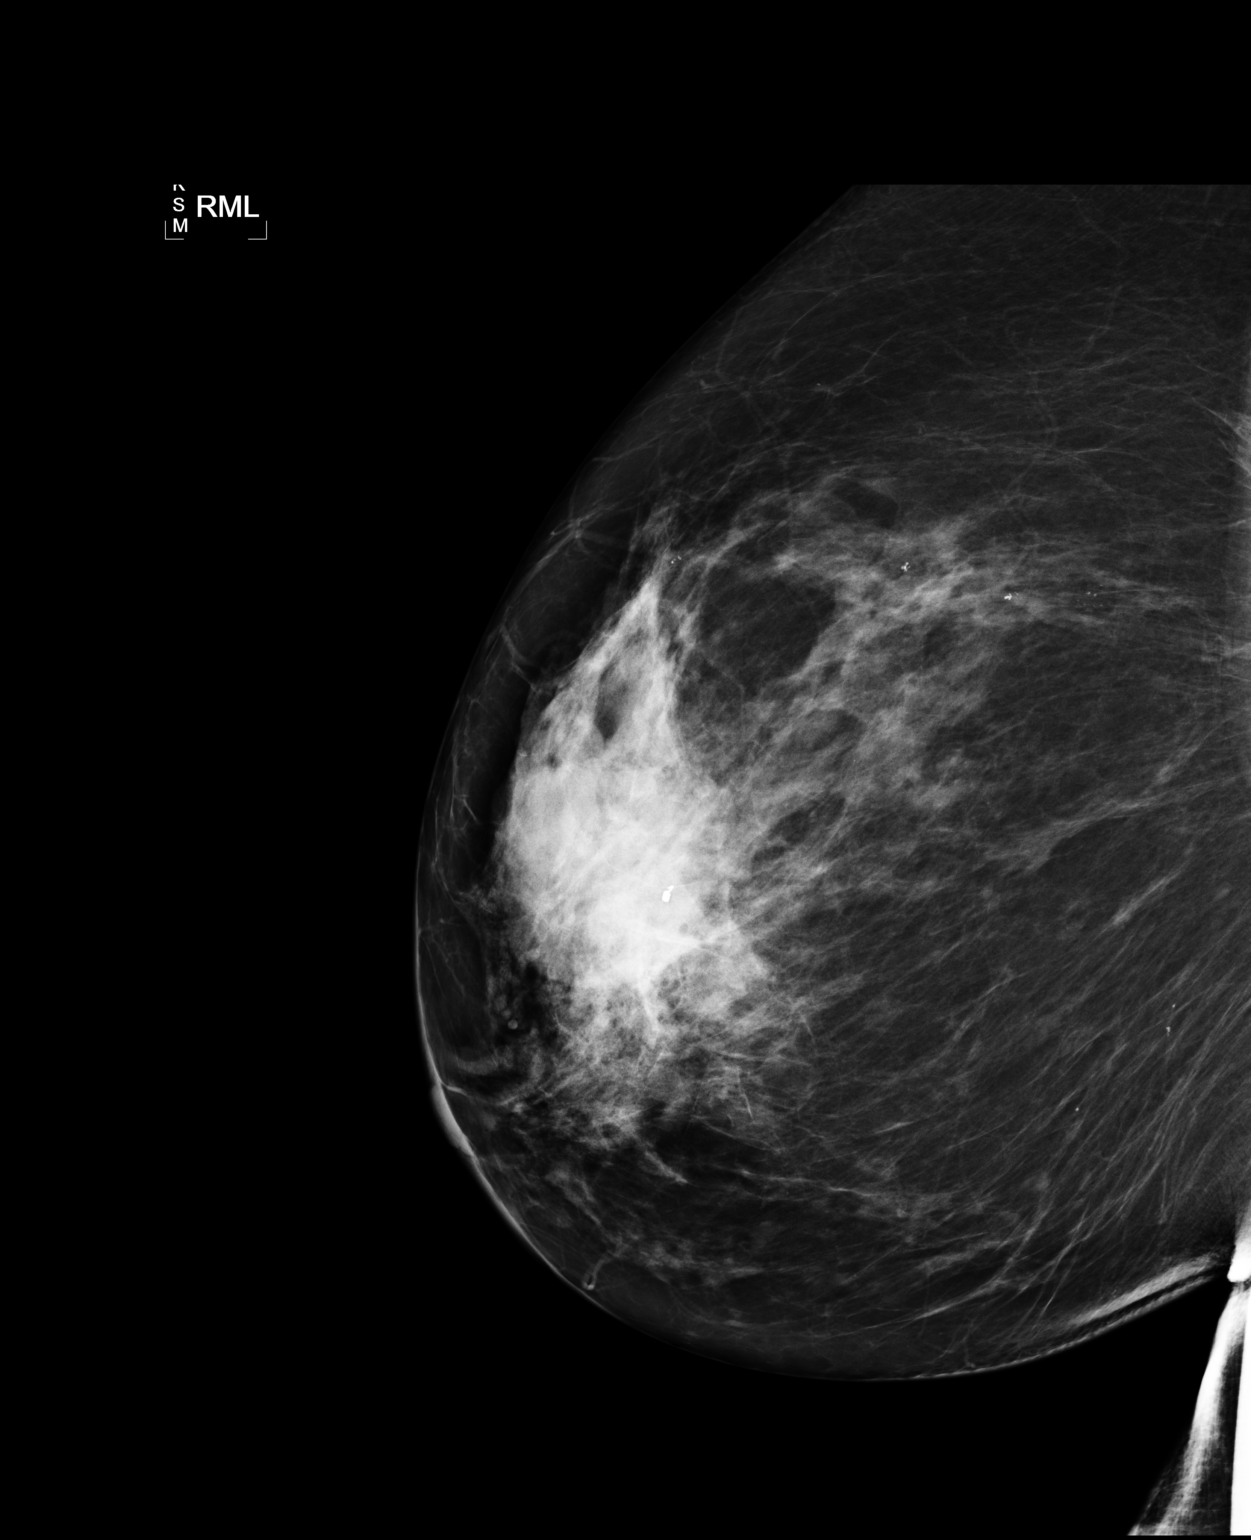

[2 of 2 positions shown; findings below may reference images not displayed]

FINDINGS: Mammographic images were obtained following ultrasound guided biopsy
of suspicious 9 o'clock right breast mass. A metallic coil marker
clip is noted within the biopsied right breast mass. There is no
significant post biopsy hematoma.
IMPRESSION: Post procedure mammogram demonstrates successful deployment of a
metallic biopsy marker clip as above.

Final Assessment: Post Procedure Mammograms for Marker Placement

## 2015-02-16 ENCOUNTER — Encounter: Payer: Medicare Other | Admitting: Nurse Practitioner

## 2015-02-18 ENCOUNTER — Telehealth: Payer: Self-pay | Admitting: *Deleted

## 2015-02-18 NOTE — Telephone Encounter (Signed)
LM for rtn call to reschedule missed appt on 10/18 or we can mail Care plan to her.

## 2015-02-24 ENCOUNTER — Encounter: Payer: Self-pay | Admitting: Nurse Practitioner

## 2015-02-24 DIAGNOSIS — C50411 Malignant neoplasm of upper-outer quadrant of right female breast: Secondary | ICD-10-CM

## 2015-02-24 NOTE — Progress Notes (Signed)
The Survivorship Care Plan was mailed to Stephanie Olsen as she was unable to come in to the Survivorship Clinic for an in-person visit at this time. A letter was mailed to her outlining the purpose of the content of the care plan, as well as encouraging her to reach out to me with any questions or concerns.  My business card was included in the correspondence to the patient as well.  A copy of the care plan was also routed/faxed/mailed to Laurey Morale, MD, the patient's PCP.  I will not be placing any follow-up appointments to the Survivorship Clinic for [name], but I am happy to see her at any time in the future for any survivorship concerns that may arise. Thank you for allowing me to participate in her care!  Kenn File, [place] [phone]

## 2015-03-30 DIAGNOSIS — Z23 Encounter for immunization: Secondary | ICD-10-CM | POA: Diagnosis not present

## 2015-04-06 ENCOUNTER — Encounter: Payer: Self-pay | Admitting: Hematology and Oncology

## 2015-04-06 ENCOUNTER — Telehealth: Payer: Self-pay | Admitting: Hematology and Oncology

## 2015-04-06 ENCOUNTER — Other Ambulatory Visit: Payer: Self-pay | Admitting: Family Medicine

## 2015-04-06 ENCOUNTER — Other Ambulatory Visit: Payer: Self-pay | Admitting: Hematology and Oncology

## 2015-04-06 ENCOUNTER — Ambulatory Visit (HOSPITAL_BASED_OUTPATIENT_CLINIC_OR_DEPARTMENT_OTHER): Payer: Medicare Other | Admitting: Hematology and Oncology

## 2015-04-06 VITALS — BP 115/59 | HR 68 | Temp 97.6°F | Resp 18 | Ht 66.0 in | Wt 190.9 lb

## 2015-04-06 DIAGNOSIS — C50411 Malignant neoplasm of upper-outer quadrant of right female breast: Secondary | ICD-10-CM

## 2015-04-06 DIAGNOSIS — I429 Cardiomyopathy, unspecified: Secondary | ICD-10-CM | POA: Diagnosis not present

## 2015-04-06 DIAGNOSIS — I420 Dilated cardiomyopathy: Secondary | ICD-10-CM

## 2015-04-06 NOTE — Telephone Encounter (Signed)
Stephanie Olsen needs a refill of Flonase sent to her pharmacy. If you have questions or concerns, please give her a call.  Pt's ph# [phone] Thank you.

## 2015-04-06 NOTE — Telephone Encounter (Signed)
Appointments made and avs printed for patient,mammo order has been placed but the dexa is from another phy, i have sent a note to dr Lindi Adie to add a dexa scan and will fax the orders to the breast center  To call the patient and she is aware

## 2015-04-06 NOTE — Progress Notes (Signed)
Patient Care Team: Laurey Morale, MD as PCP - General Excell Seltzer, MD as Consulting Physician (General Surgery) Nicholas Lose, MD as Consulting Physician (Hematology and Oncology) Arloa Koh, MD as Consulting Physician (Radiation Oncology) Holley Bouche, NP as Nurse Practitioner (Nurse Practitioner) Sylvan Cheese, NP as Nurse Practitioner (Hematology and Oncology)  DIAGNOSIS: Breast cancer of upper-outer quadrant of right female breast [facility])   Staging form: Breast, AJCC 7th Edition     Clinical stage from 04/01/2014: Stage IIB (T3, N0, M0) - Unsigned       Staging comments: Staged at breast conference on 12.2.15      Pathologic stage from 09/30/2014: yT0, N0, cM0 - Unsigned       Staging comments: Staged on final lumpectomy specimen by Dr. Avis Epley    SUMMARY OF ONCOLOGIC HISTORY:   Breast cancer of upper-outer quadrant of right female breast ([place])   03/18/2014 Mammogram Right breast: suspicious mass 10:00 position measuring 4.9 x 3 x 5 cm, 2 lower right axillary lymph nodes mildly thickened cortices   03/23/2014 Initial Biopsy Right breast needle biopsy 9:00: Invasive ductal carcinoma grade 3, ER- (0%), PR- (0%), HER-2 negative (ratio 1.3), Ki67 95%   03/31/2014 Breast MRI Right breast mass 9:00 upper outer quadrant with contiguous skin involvement by 5.7 cm, right axillary lymph nodes normal in size   04/01/2014 Clinical Stage Stage IIB: T2 N0   04/10/2014 - 08/26/2014 Neo-Adjuvant Chemotherapy Dose dense doxorubicin and cyclophosphamide 4 cycles followed by weekly paclitaxel 12. After week 1, paclitaxel changed to nab-paclitaxel    08/28/2014 Breast MRI Decrease in the size of the right breast mass from 5.7 cm to 8 mm, skin enhancement is no longer seen, decreased in size of axillary lymph nodes, excellent response to chemotherapy   09/29/2014 Definitive Surgery Right Lumpectomy / SLNB ([facility]): pathologic CR, 2 LN removed and negative for malignancy (0/2 LN)   11/11/2014 - 12/18/2014 Radiation Therapy Adjuvant RT [facility]): Right breast 50.4 Gy over 28 fractions   02/24/2015 Survivorship Survivorship care plan completed and mailed to patient in lieu of in person visit.    CHIEF COMPLIANT: follow-up of right breast cancer on surveillance  INTERVAL HISTORY: Stephanie Olsen is a 69 year old with above-mentioned history of right breast cancer treated with neoadjuvant chemotherapy followed by lumpectomy. General pathologic complete response. She completed radiation therapy and is currently on surveillance. She does not report any major problems or concerns other than the fact that her energy levels are still not back yet fully. She also feels tightness in the right upper part of the chest whenever she does yoga.  REVIEW OF SYSTEMS:   Constitutional: Denies fevers, chills or abnormal weight loss Eyes: Denies blurriness of vision Ears, nose, mouth, throat, and face: Denies mucositis or sore throat Respiratory: Denies cough, dyspnea or wheezes Cardiovascular: Denies palpitation, chest discomfort or lower extremity swelling Gastrointestinal:  Denies nausea, heartburn or change in bowel habits Skin: Denies abnormal skin rashes Lymphatics: Denies new lymphadenopathy or easy bruising Neurological:Denies numbness, tingling or new weaknesses Behavioral/Psych: Mood is stable, no new changes  Breast: tightness in the right chest wall All other systems were reviewed with the patient and are negative.  I have reviewed the past medical history, past surgical history, social history and family history with the patient and they are unchanged from previous note.  ALLERGIES:  has No Known Allergies.  MEDICATIONS:  Current Outpatient Prescriptions  Medication Sig Dispense Refill  . Ascorbic Acid (VITAMIN C) 500 MG tablet Take  500 mg by mouth 2 (two) times daily.     Marland Kitchen aspirin 81 MG tablet Take 81 mg by mouth every morning.     . Calcium Carb-Cholecalciferol (CALCIUM  600/VITAMIN D3) 600-800 MG-UNIT TABS Take 1 tablet by mouth every morning.    . carvedilol (COREG) 3.125 MG tablet Take 1 tablet (3.125 mg total) by mouth 2 (two) times daily. 180 tablet 3  . cetirizine (ZYRTEC) 10 MG tablet Take 10 mg by mouth every morning.     . clonazePAM (KLONOPIN) 2 MG tablet Take 1 tablet (2 mg total) by mouth 2 (two) times daily as needed for anxiety. (Patient not taking: Reported on 01/19/2015) 180 tablet 1  . cyclobenzaprine (FLEXERIL) 10 MG tablet Take 1 tablet (10 mg total) by mouth 3 (three) times daily as needed for muscle spasms. For spasms (Patient not taking: Reported on 01/19/2015) 90 tablet 5  . fish oil-omega-3 fatty acids 1000 MG capsule Take 1 g by mouth every morning.     . fluticasone (FLONASE) 50 MCG/ACT nasal spray INSTILL 2 SPRAYS INTO THE NOSE DAILY 48 g 1  . furosemide (LASIX) 20 MG tablet Take 1 tablet (20 mg total) by mouth daily. 30 tablet 1  . ibuprofen (ADVIL,MOTRIN) 200 MG tablet Take 200 mg by mouth every 6 (six) hours as needed for headache or mild pain.    Marland Kitchen latanoprost (XALATAN) 0.005 % ophthalmic solution Place 1 drop into both eyes at bedtime.    . Magnesium 500 MG TABS Take 1 tablet by mouth at bedtime.    . metoCLOPramide (REGLAN) 10 MG tablet   5  . metroNIDAZOLE (METROGEL) 0.75 % gel Apply 1 application topically 2 (two) times daily as needed (rosacea on face.).     Marland Kitchen Multiple Vitamins-Minerals (PRESERVISION AREDS 2 PO) Take by mouth.    . timolol (TIMOPTIC-XR) 0.5 % ophthalmic gel-forming Place 2 drops into both eyes daily.     No current facility-administered medications for this visit.   Facility-Administered Medications Ordered in Other Visits  Medication Dose Route Frequency Provider Last Rate Last Dose  . sodium chloride 0.9 % injection 10 mL  10 mL Intracatheter PRN Nicholas Lose, MD   10 mL at 06/17/14 1635    PHYSICAL EXAMINATION: ECOG PERFORMANCE STATUS: 1 - Symptomatic but completely ambulatory  Filed Vitals:   04/06/15  1121  BP: 115/59  Pulse: 68  Temp: 97.6 F (36.4 C)  Resp: 18   Filed Weights   04/06/15 1121  Weight: 190 lb 14.4 oz (86.592 kg)    GENERAL:alert, no distress and comfortable SKIN: skin color, texture, turgor are normal, no rashes or significant lesions EYES: normal, Conjunctiva are pink and non-injected, sclera clear OROPHARYNX:no exudate, no erythema and lips, buccal mucosa, and tongue normal  NECK: supple, thyroid normal size, non-tender, without nodularity LYMPH:  no palpable lymphadenopathy in the cervical, axillary or inguinal LUNGS: clear to auscultation and percussion with normal breathing effort HEART: regular rate & rhythm and no murmurs and no lower extremity edema ABDOMEN:abdomen soft, non-tender and normal bowel sounds Musculoskeletal:no cyanosis of digits and no clubbing  NEURO: alert & oriented x 3 with fluent speech, no focal motor/sensory deficits BREAST: No palpable masses or nodules in either right or left breasts. No palpable axillary supraclavicular or infraclavicular adenopathy no breast tenderness or nipple discharge. (exam performed in the presence of a chaperone)  LABORATORY DATA:  I have reviewed the data as listed   Chemistry      Component Value Date/Time  NA 137 08/26/2014 0853   NA 134* 02/12/2014 1108   K 4.0 08/26/2014 0853   K 4.7 02/12/2014 1108   CL 100 02/12/2014 1108   CO2 19* 08/26/2014 0853   CO2 25 02/12/2014 1108   BUN 9.8 08/26/2014 0853   BUN 10 02/12/2014 1108   CREATININE 0.7 08/26/2014 0853   CREATININE 0.8 02/12/2014 1108      Component Value Date/Time   CALCIUM 9.3 08/26/2014 0853   CALCIUM 9.5 02/12/2014 1108   ALKPHOS 69 08/26/2014 0853   ALKPHOS 71 02/12/2014 1108   AST 16 08/26/2014 0853   AST 22 02/12/2014 1108   ALT 13 08/26/2014 0853   ALT 22 02/12/2014 1108   BILITOT 0.38 08/26/2014 0853   BILITOT 0.5 02/12/2014 1108       Lab Results  Component Value Date   WBC 5.1 08/26/2014   HGB 13.0 09/29/2014     HCT 35.1 08/26/2014   MCV 89.0 08/26/2014   PLT 328 08/26/2014   NEUTROABS 3.2 08/26/2014   I EEG due to the wide walker already brokenASSESSMENT & PLAN:  Breast cancer of upper-outer quadrant of right female breast Right breast invasive ductal carcinoma, palpable mass, 5 cm by mammogram and 5.7 cm by MRI grade 3, ER PR HER-2 negative, associated skin dimpling, T3, N0, M0 stage IIB Ki-67 90%  Chemotherapy summary: Neoadjuvant chemotherapy with dose dense Adriamycin and Cytoxan 04/10/2014. followed by weekly Taxol x1 changed to Abraxane 11 from 06/17/2014 completed 08/26/2014 Rt Lumpectomy: 09/29/14: Path CR 0/2 LN Completed adjuvant radiation therapy 12/18/2014  Breast Cancer Surveillance: 1. Breast exam 04/06/2015: right breast upper quadrant feels tight in the muscle. No palpable nodularity. 2. Mammograms will need to be scheduled along with bone density test within the next month. She plans to discuss a bone density report with her primary care physician in January.  Cardiomyopathy: I instructed her to follow with cardiology She continues to stay active and helps take care of her 23-monthold granddaughter as well as her 847year old aunt. RTC in 6 months for continued surveillance   Orders Placed This Encounter  Procedures  . MM Digital Diagnostic Bilat    Please coordinate with Bone density    Standing Status: Future     Number of Occurrences:      Standing Expiration Date: 04/05/2016    Order Specific Question:  Reason for Exam (SYMPTOM  OR DIAGNOSIS REQUIRED)    Answer:  Annual Mammograms with H/O breast cancer    Order Specific Question:  [facility]    [facility]  The patient has a good understanding of the overall plan. she agrees with it. she will call with any problems that may develop before the next visit here.   GRulon Eisenmenger MD 04/06/2015

## 2015-04-06 NOTE — Assessment & Plan Note (Signed)
Right breast invasive ductal carcinoma, palpable mass, 5 cm by mammogram and 5.7 cm by MRI grade 3, ER PR HER-2 negative, associated skin dimpling, T3, N0, M0 stage IIB Ki-67 90%  Chemotherapy summary: Neoadjuvant chemotherapy with dose dense Adriamycin and Cytoxan 04/10/2014. followed by weekly Taxol x1 changed to Abraxane 11 from 06/17/2014 completed 08/26/2014 Rt Lumpectomy: 09/29/14: Path CR 0/2 LN Completed adjuvant radiation therapy 12/18/2014  Breast Cancer Surveillance: 1. Breast exam 04/06/2015: Normal 2. Mammograms will need to be scheduled  Cardiomyopathy: follows cardiology  RTC in 6 months for continued surveillance

## 2015-04-06 NOTE — Addendum Note (Signed)
Addended by: Prentiss Bells on: 04/06/2015 12:27 PM   Modules accepted: Medications

## 2015-04-07 NOTE — Telephone Encounter (Signed)
Please refill for one year  

## 2015-04-07 NOTE — Telephone Encounter (Signed)
Can we refill this? 

## 2015-04-08 MED ORDER — FLUTICASONE PROPIONATE 50 MCG/ACT NA SUSP: NASAL | Status: DC

## 2015-04-08 NOTE — Telephone Encounter (Signed)
Rx was sent  

## 2015-04-09 ENCOUNTER — Other Ambulatory Visit: Payer: Self-pay | Admitting: *Deleted

## 2015-04-09 DIAGNOSIS — M810 Age-related osteoporosis without current pathological fracture: Secondary | ICD-10-CM

## 2015-04-16 ENCOUNTER — Telehealth: Payer: Self-pay

## 2015-04-16 NOTE — Telephone Encounter (Signed)
Returned pt call - advised her that Dr. Lindi Adie has already left for the weekend.  She will need to see her PCP or go to an urgent care.  Pt reports her PCP cannot see her until January.  Advised pt again that Dr. Lindi Adie has already left for the weekend and let her know there is an urgent care on the corner of [address].  Pt voiced understanding.

## 2015-04-16 NOTE — Telephone Encounter (Signed)
Pt finished chemo xrt and surgery. She has had sinus infection since Sunday. No time to go to [place] d/t christmas. Asking for antibiotic. Getting worse, head hurting, slight fever.

## 2015-04-17 DIAGNOSIS — J019 Acute sinusitis, unspecified: Secondary | ICD-10-CM | POA: Diagnosis not present

## 2015-04-17 DIAGNOSIS — R05 Cough: Secondary | ICD-10-CM | POA: Diagnosis not present

## 2015-04-27 ENCOUNTER — Encounter: Payer: Medicare Other | Admitting: Family Medicine

## 2015-04-29 ENCOUNTER — Encounter: Payer: Self-pay | Admitting: Adult Health

## 2015-04-29 DIAGNOSIS — C50911 Malignant neoplasm of unspecified site of right female breast: Secondary | ICD-10-CM | POA: Diagnosis not present

## 2015-04-29 NOTE — Progress Notes (Signed)
A birthday card was mailed to the patient today on behalf of the Survivorship Program at [facility].   Desarai Barrack, NP Survivorship Program [facility] [phone]  

## 2015-05-04 DIAGNOSIS — H401122 Primary open-angle glaucoma, left eye, moderate stage: Secondary | ICD-10-CM | POA: Diagnosis not present

## 2015-05-04 DIAGNOSIS — H401112 Primary open-angle glaucoma, right eye, moderate stage: Secondary | ICD-10-CM | POA: Diagnosis not present

## 2015-05-12 ENCOUNTER — Other Ambulatory Visit: Payer: Self-pay | Admitting: Hematology and Oncology

## 2015-05-12 DIAGNOSIS — M810 Age-related osteoporosis without current pathological fracture: Secondary | ICD-10-CM

## 2015-05-14 ENCOUNTER — Ambulatory Visit
Admission: RE | Admit: 2015-05-14 | Discharge: 2015-05-14 | Disposition: A | Discharge status: Home / Self Care | Payer: Medicare Other | Source: Ambulatory Visit | Attending: Hematology and Oncology | Admitting: Hematology and Oncology

## 2015-05-14 ENCOUNTER — Other Ambulatory Visit: Payer: Self-pay | Admitting: Hematology and Oncology

## 2015-05-14 DIAGNOSIS — C50411 Malignant neoplasm of upper-outer quadrant of right female breast: Secondary | ICD-10-CM

## 2015-05-14 DIAGNOSIS — M85851 Other specified disorders of bone density and structure, right thigh: Secondary | ICD-10-CM | POA: Diagnosis not present

## 2015-05-14 DIAGNOSIS — M810 Age-related osteoporosis without current pathological fracture: Secondary | ICD-10-CM

## 2015-05-14 DIAGNOSIS — R928 Other abnormal and inconclusive findings on diagnostic imaging of breast: Secondary | ICD-10-CM | POA: Diagnosis not present

## 2015-05-18 ENCOUNTER — Ambulatory Visit (INDEPENDENT_AMBULATORY_CARE_PROVIDER_SITE_OTHER): Payer: Medicare Other | Admitting: Family Medicine

## 2015-05-18 ENCOUNTER — Encounter: Payer: Self-pay | Admitting: Family Medicine

## 2015-05-18 VITALS — BP 119/75 | HR 71 | Temp 98.6°F | Ht 66.0 in | Wt 193.0 lb

## 2015-05-18 DIAGNOSIS — C50411 Malignant neoplasm of upper-outer quadrant of right female breast: Secondary | ICD-10-CM

## 2015-05-18 DIAGNOSIS — I10 Essential (primary) hypertension: Secondary | ICD-10-CM | POA: Diagnosis not present

## 2015-05-18 DIAGNOSIS — F411 Generalized anxiety disorder: Secondary | ICD-10-CM | POA: Diagnosis not present

## 2015-05-18 DIAGNOSIS — Z23 Encounter for immunization: Secondary | ICD-10-CM | POA: Diagnosis not present

## 2015-05-18 DIAGNOSIS — I420 Dilated cardiomyopathy: Secondary | ICD-10-CM | POA: Diagnosis not present

## 2015-05-18 LAB — CBC WITH DIFFERENTIAL/PLATELET
BASOS PCT: 0.5 % (ref 0.0–3.0)
Basophils Absolute: 0 10*3/uL (ref 0.0–0.1)
EOS ABS: 0.3 10*3/uL (ref 0.0–0.7)
Eosinophils Relative: 4.7 % (ref 0.0–5.0)
HCT: 43.3 % (ref 36.0–46.0)
HEMOGLOBIN: 14.2 g/dL (ref 12.0–15.0)
LYMPHS ABS: 1.8 10*3/uL (ref 0.7–4.0)
Lymphocytes Relative: 28.5 % (ref 12.0–46.0)
MCHC: 32.8 g/dL (ref 30.0–36.0)
MCV: 90.4 fl (ref 78.0–100.0)
MONO ABS: 0.8 10*3/uL (ref 0.1–1.0)
Monocytes Relative: 12.9 % — ABNORMAL HIGH (ref 3.0–12.0)
NEUTROS ABS: 3.3 10*3/uL (ref 1.4–7.7)
NEUTROS PCT: 53.4 % (ref 43.0–77.0)
PLATELETS: 266 10*3/uL (ref 150.0–400.0)
RBC: 4.79 Mil/uL (ref 3.87–5.11)
RDW: 15.4 % (ref 11.5–15.5)
WBC: 6.2 10*3/uL (ref 4.0–10.5)

## 2015-05-18 LAB — LIPID PANEL
CHOLESTEROL: 211 mg/dL — AB (ref 0–200)
HDL: 66.7 mg/dL (ref >39.00)
LDL Cholesterol: 121 mg/dL — ABNORMAL HIGH (ref 0–99)
NonHDL: 144.32
Total CHOL/HDL Ratio: 3
Triglycerides: 115 mg/dL (ref 0.0–149.0)
VLDL: 23 mg/dL (ref 0.0–40.0)

## 2015-05-18 LAB — POCT URINALYSIS DIPSTICK
BILIRUBIN UA: NEGATIVE
Glucose, UA: NEGATIVE
Ketones, UA: NEGATIVE
NITRITE UA: NEGATIVE
PH UA: 7
Protein, UA: NEGATIVE
RBC UA: NEGATIVE
SPEC GRAV UA: 1.02
Urobilinogen, UA: 0.2

## 2015-05-18 LAB — HEPATIC FUNCTION PANEL
ALBUMIN: 3.9 g/dL (ref 3.5–5.2)
ALK PHOS: 88 U/L (ref 39–117)
ALT: 13 U/L (ref 0–35)
AST: 17 U/L (ref 0–37)
Bilirubin, Direct: 0.1 mg/dL (ref 0.0–0.3)
TOTAL PROTEIN: 6.8 g/dL (ref 6.0–8.3)
Total Bilirubin: 0.8 mg/dL (ref 0.2–1.2)

## 2015-05-18 LAB — BASIC METABOLIC PANEL
BUN: 8 mg/dL (ref 6–23)
CALCIUM: 9.4 mg/dL (ref 8.4–10.5)
CO2: 29 meq/L (ref 19–32)
CREATININE: 0.67 mg/dL (ref 0.40–1.20)
Chloride: 104 mEq/L (ref 96–112)
GFR: 93.02 mL/min (ref >60.00)
GLUCOSE: 98 mg/dL (ref 70–99)
Potassium: 5.2 mEq/L — ABNORMAL HIGH (ref 3.5–5.1)
Sodium: 139 mEq/L (ref 135–145)

## 2015-05-18 LAB — TSH: TSH: 2.23 u[IU]/mL (ref 0.35–4.50)

## 2015-05-18 MED ORDER — METOCLOPRAMIDE HCL 10 MG PO TABS: 10.0000 mg | ORAL_TABLET | Freq: Three times a day (TID) | ORAL | Status: DC

## 2015-05-18 MED ORDER — CLONAZEPAM 2 MG PO TABS: 2.0000 mg | ORAL_TABLET | Freq: Two times a day (BID) | ORAL | Status: DC | PRN

## 2015-05-18 NOTE — Progress Notes (Signed)
Pre visit review using our clinic review tool, if applicable. No additional management support is needed unless otherwise documented below in the visit note. 

## 2015-05-18 NOTE — Patient Instructions (Signed)
I

## 2015-05-18 NOTE — Progress Notes (Signed)
   Subjective:    Patient ID: Stephanie Olsen, female    DOB: 14-Feb-1946, 70 y.o.   MRN: [id number]  HPI 70 yr old female for a follow up on HTN, cardiomyopathy, and anxiety. In general she has felt well although she has had a rough year getting chemotherapy and radiation therapy for breast cancer. She had a lumpectomy also, and so far all the signs point to the fact that it is in remission. She had also been started on Coreg by Dr. Tempie Hoist, but she stopped taking this a few weeks ago because it dropped her BP and heart rates too low. Her BP would often be around 100/50 and the heart rate in the 50s. This would make her feel lightheaded and weak. Since stopping it she feels much better. Her BP as been stable lately. Her appetite is good and she is wallking for exercise.    Review of Systems  Constitutional: Negative.   HENT: Negative.   Eyes: Negative.   Respiratory: Negative.   Cardiovascular: Negative.   Gastrointestinal: Negative.   Genitourinary: Negative for dysuria, urgency, frequency, hematuria, flank pain, decreased urine volume, enuresis, difficulty urinating, pelvic pain and dyspareunia.  Musculoskeletal: Negative.   Skin: Negative.   Neurological: Negative.   Psychiatric/Behavioral: Negative.        Objective:   Physical Exam  Constitutional: She is oriented to person, place, and time. She appears well-developed and well-nourished. No distress.  HENT:  Head: Normocephalic and atraumatic.  Right Ear: External ear normal.  Left Ear: External ear normal.  Nose: Nose normal.  Mouth/Throat: Oropharynx is clear and moist. No oropharyngeal exudate.  Eyes: Conjunctivae and EOM are normal. Pupils are equal, round, and reactive to light. No scleral icterus.  Neck: Normal range of motion. Neck supple. No JVD present. No thyromegaly present.  Cardiovascular: Normal rate, regular rhythm, normal heart sounds and intact distal pulses.  Exam reveals no gallop and no friction rub.   No  murmur heard. EKG normal with an occasional PVC  Pulmonary/Chest: Effort normal and breath sounds normal. No respiratory distress. She has no wheezes. She has no rales. She exhibits no tenderness.  Abdominal: Soft. Bowel sounds are normal. She exhibits no distension and no mass. There is no tenderness. There is no rebound and no guarding.  Musculoskeletal: Normal range of motion. She exhibits no edema or tenderness.  Lymphadenopathy:    She has no cervical adenopathy.  Neurological: She is alert and oriented to person, place, and time. She has normal reflexes. No cranial nerve deficit. She exhibits normal muscle tone. Coordination normal.  Skin: Skin is warm and dry. No rash noted. No erythema.  Psychiatric: She has a normal mood and affect. Her behavior is normal. Judgment and thought content normal.          Assessment & Plan:  She is doing well. Her HTN is stable off Coreg. I reminded her to set up a follow up appt with Dr. Haroldine Laws soon. She is following closely with Oncology. Her anxiety is stable. She will get fasting labs today.

## 2015-09-28 ENCOUNTER — Other Ambulatory Visit: Payer: Self-pay | Admitting: Obstetrics and Gynecology

## 2015-09-28 DIAGNOSIS — Z124 Encounter for screening for malignant neoplasm of cervix: Secondary | ICD-10-CM | POA: Diagnosis not present

## 2015-09-29 LAB — CYTOLOGY - PAP

## 2015-10-06 ENCOUNTER — Ambulatory Visit: Payer: Medicare Other | Admitting: Hematology and Oncology

## 2015-10-19 ENCOUNTER — Ambulatory Visit (HOSPITAL_BASED_OUTPATIENT_CLINIC_OR_DEPARTMENT_OTHER): Payer: Medicare Other | Admitting: Hematology and Oncology

## 2015-10-19 ENCOUNTER — Encounter: Payer: Self-pay | Admitting: Hematology and Oncology

## 2015-10-19 ENCOUNTER — Telehealth: Payer: Self-pay | Admitting: Hematology and Oncology

## 2015-10-19 VITALS — BP 119/68 | HR 69 | Temp 98.3°F | Resp 18 | Wt 189.3 lb

## 2015-10-19 DIAGNOSIS — C50411 Malignant neoplasm of upper-outer quadrant of right female breast: Secondary | ICD-10-CM

## 2015-10-19 NOTE — Assessment & Plan Note (Signed)
Right breast invasive ductal carcinoma, palpable mass, 5 cm by mammogram and 5.7 cm by MRI grade 3, ER PR HER-2 negative, associated skin dimpling, T3, N0, M0 stage IIB Ki-67 90%  Chemotherapy summary: Neoadjuvant chemotherapy with dose dense Adriamycin and Cytoxan 04/10/2014. followed by weekly Taxol x1 changed to Abraxane 11 from 06/17/2014 completed 08/26/2014 Rt Lumpectomy: 09/29/14: Path CR 0/2 LN Completed adjuvant radiation therapy 12/18/2014  Breast Cancer Surveillance: 1. Breast exam 10/19/2015: right breast upper quadrant feels tight in the muscle. No palpable nodularity. 2. Mammograms 05/14/2015: Benign , breast density category B 3. Bone density 05/14/2015: T score -1.3 osteopenia  Cardiomyopathy: I instructed her to follow with cardiology She continues to stay active and helps take care of her 68 yr old granddaughter as well as her 89 year old aunt. RTC in 6 months for continued surveillance

## 2015-10-19 NOTE — Telephone Encounter (Signed)
appt made and avs printed °

## 2015-10-19 NOTE — Progress Notes (Signed)
Patient Care Team: Laurey Morale, MD as PCP - General Excell Seltzer, MD as Consulting Physician (General Surgery) Nicholas Lose, MD as Consulting Physician (Hematology and Oncology) Arloa Koh, MD as Consulting Physician (Radiation Oncology) Holley Bouche, NP as Nurse Practitioner (Nurse Practitioner) Sylvan Cheese, NP as Nurse Practitioner (Hematology and Oncology)  DIAGNOSIS: Breast cancer of upper-outer quadrant of right female breast [facility])   Staging form: Breast, AJCC 7th Edition     Clinical stage from 04/01/2014: Stage IIB (T3, N0, M0) - Unsigned       Staging comments: Staged at breast conference on 12.2.15      Pathologic stage from 09/30/2014: yT0, N0, cM0 - Unsigned       Staging comments: Staged on final lumpectomy specimen by Dr. Avis Epley  SUMMARY OF ONCOLOGIC HISTORY:   Breast cancer of upper-outer quadrant of right female breast ([place])   03/18/2014 Mammogram Right breast: suspicious mass 10:00 position measuring 4.9 x 3 x 5 cm, 2 lower right axillary lymph nodes mildly thickened cortices   03/23/2014 Initial Biopsy Right breast needle biopsy 9:00: Invasive ductal carcinoma grade 3, ER- (0%), PR- (0%), HER-2 negative (ratio 1.3), Ki67 95%   03/31/2014 Breast MRI Right breast mass 9:00 upper outer quadrant with contiguous skin involvement by 5.7 cm, right axillary lymph nodes normal in size   04/01/2014 Clinical Stage Stage IIB: T2 N0   04/10/2014 - 08/26/2014 Neo-Adjuvant Chemotherapy Dose dense doxorubicin and cyclophosphamide 4 cycles followed by weekly paclitaxel 12. After week 1, paclitaxel changed to nab-paclitaxel    08/28/2014 Breast MRI Decrease in the size of the right breast mass from 5.7 cm to 8 mm, skin enhancement is no longer seen, decreased in size of axillary lymph nodes, excellent response to chemotherapy   09/29/2014 Definitive Surgery Right Lumpectomy / SLNB ([facility]): pathologic CR, 2 LN removed and negative for malignancy (0/2 LN)   11/11/2014 - 12/18/2014 Radiation Therapy Adjuvant RT [facility]): Right breast 50.4 Gy over 28 fractions   02/24/2015 Survivorship Survivorship care plan completed and mailed to patient in lieu of in person visit.    CHIEF COMPLIANT: Surveillance of breast cancer  INTERVAL HISTORY: Stephanie Olsen is a 70 year old with above-mentioned history of right breast triple negative cancer treated with neoadjuvant chemotherapy and a complete pathologic response to chemotherapy. She underwent lumpectomy followed by adjuvant radiation and is here for surveillance. She reports no major problems or concerns. She has occasional pain in the right breast and axilla. She continues to stay active and participates in [place] and physical exercise but she is continues to feel fatigued.  REVIEW OF SYSTEMS:   Constitutional: Denies fevers, chills or abnormal weight loss Eyes: Denies blurriness of vision Ears, nose, mouth, throat, and face: Denies mucositis or sore throat Respiratory: Denies cough, dyspnea or wheezes Cardiovascular: Denies palpitation, chest discomfort Gastrointestinal:  Denies nausea, heartburn or change in bowel habits Skin: Denies abnormal skin rashes Lymphatics: Denies new lymphadenopathy or easy bruising Neurological:Denies numbness, tingling or new weaknesses Behavioral/Psych: Mood is stable, no new changes  Extremities: No lower extremity edema Breast:  denies any pain or lumps or nodules in either breasts All other systems were reviewed with the patient and are negative.  I have reviewed the past medical history, past surgical history, social history and family history with the patient and they are unchanged from previous note.  ALLERGIES:  is allergic to fosamax.  MEDICATIONS:  Current Outpatient Prescriptions  Medication Sig Dispense Refill  . Ascorbic Acid (VITAMIN C) 500  MG tablet Take 1,000 mg by mouth daily.     Marland Kitchen aspirin 81 MG tablet Take 81 mg by mouth every morning.     .  Calcium Carb-Cholecalciferol (CALCIUM 600/VITAMIN D3) 600-800 MG-UNIT TABS Take 1 tablet by mouth every morning.    . cetirizine (ZYRTEC) 10 MG tablet Take 10 mg by mouth every morning.     . clonazePAM (KLONOPIN) 2 MG tablet Take 1 tablet (2 mg total) by mouth 2 (two) times daily as needed for anxiety. 180 tablet 1  . cyclobenzaprine (FLEXERIL) 10 MG tablet Take 1 tablet (10 mg total) by mouth 3 (three) times daily as needed for muscle spasms. For spasms (Patient not taking: Reported on 05/18/2015) 90 tablet 5  . fish oil-omega-3 fatty acids 1000 MG capsule Take 1 g by mouth every morning.     . fluticasone (FLONASE) 50 MCG/ACT nasal spray INSTILL 2 SPRAYS INTO THE NOSE DAILY 48 g 11  . furosemide (LASIX) 20 MG tablet Take 1 tablet (20 mg total) by mouth daily. (Patient not taking: Reported on 05/18/2015) 30 tablet 1  . ibuprofen (ADVIL,MOTRIN) 200 MG tablet Take 200 mg by mouth every 6 (six) hours as needed for headache or mild pain.    Marland Kitchen latanoprost (XALATAN) 0.005 % ophthalmic solution Place 1 drop into both eyes at bedtime.    . Magnesium 500 MG TABS Take 1 tablet by mouth at bedtime. Reported on 05/18/2015    . metoCLOPramide (REGLAN) 10 MG tablet Take 1 tablet (10 mg total) by mouth 4 (four) times daily -  before meals and at bedtime. Reported on 05/18/2015 120 tablet 5  . metroNIDAZOLE (METROGEL) 0.75 % gel Apply 1 application topically 2 (two) times daily as needed (rosacea on face.). Reported on 05/18/2015    . Multiple Vitamins-Minerals (PRESERVISION AREDS 2 PO) Take by mouth.    . timolol (TIMOPTIC-XR) 0.5 % ophthalmic gel-forming Place 2 drops into both eyes daily.     No current facility-administered medications for this visit.   Facility-Administered Medications Ordered in Other Visits  Medication Dose Route Frequency Provider Last Rate Last Dose  . sodium chloride 0.9 % injection 10 mL  10 mL Intracatheter PRN Nicholas Lose, MD   10 mL at 06/17/14 1635    PHYSICAL EXAMINATION: ECOG  PERFORMANCE STATUS: 1 - Symptomatic but completely ambulatory  Filed Vitals:   10/19/15 1034  BP: 119/68  Pulse: 69  Temp: 98.3 F (36.8 C)  Resp: 18   Filed Weights   10/19/15 1034  Weight: 189 lb 4.8 oz (85.866 kg)    GENERAL:alert, no distress and comfortable SKIN: skin color, texture, turgor are normal, no rashes or significant lesions EYES: normal, Conjunctiva are pink and non-injected, sclera clear OROPHARYNX:no exudate, no erythema and lips, buccal mucosa, and tongue normal  NECK: supple, thyroid normal size, non-tender, without nodularity LYMPH:  no palpable lymphadenopathy in the cervical, axillary or inguinal LUNGS: clear to auscultation and percussion with normal breathing effort HEART: regular rate & rhythm and no murmurs and no lower extremity edema ABDOMEN:abdomen soft, non-tender and normal bowel sounds MUSCULOSKELETAL:no cyanosis of digits and no clubbing  NEURO: alert & oriented x 3 with fluent speech, no focal motor/sensory deficits EXTREMITIES: No lower extremity edema BREAST: No palpable masses or nodules in either right or left breasts. No palpable axillary supraclavicular or infraclavicular adenopathy no breast tenderness or nipple discharge. (exam performed in the presence of a chaperone)  LABORATORY DATA:  I have reviewed the data as listed  Chemistry      Component Value Date/Time   NA 139 05/18/2015 1132   NA 137 08/26/2014 0853   K 5.2* 05/18/2015 1132   K 4.0 08/26/2014 0853   CL 104 05/18/2015 1132   CO2 29 05/18/2015 1132   CO2 19* 08/26/2014 0853   BUN 8 05/18/2015 1132   BUN 9.8 08/26/2014 0853   CREATININE 0.67 05/18/2015 1132   CREATININE 0.7 08/26/2014 0853      Component Value Date/Time   CALCIUM 9.4 05/18/2015 1132   CALCIUM 9.3 08/26/2014 0853   ALKPHOS 88 05/18/2015 1132   ALKPHOS 69 08/26/2014 0853   AST 17 05/18/2015 1132   AST 16 08/26/2014 0853   ALT 13 05/18/2015 1132   ALT 13 08/26/2014 0853   BILITOT 0.8 05/18/2015  1132   BILITOT 0.38 08/26/2014 0853       Lab Results  Component Value Date   WBC 6.2 05/18/2015   HGB 14.2 05/18/2015   HCT 43.3 05/18/2015   MCV 90.4 05/18/2015   PLT 266.0 05/18/2015   NEUTROABS 3.3 05/18/2015   ASSESSMENT & PLAN:  Breast cancer of upper-outer quadrant of right female breast Right breast invasive ductal carcinoma, palpable mass, 5 cm by mammogram and 5.7 cm by MRI grade 3, ER PR HER-2 negative, associated skin dimpling, T3, N0, M0 stage IIB Ki-67 90%  Chemotherapy summary: Neoadjuvant chemotherapy with dose dense Adriamycin and Cytoxan 04/10/2014. followed by weekly Taxol x1 changed to Abraxane 11 from 06/17/2014 completed 08/26/2014 Rt Lumpectomy: 09/29/14: Path CR 0/2 LN Completed adjuvant radiation therapy 12/18/2014  Breast Cancer Surveillance: 1. Breast exam 10/19/2015: right breast upper quadrant feels tight in the muscle. No palpable nodularity. 2. Mammograms 05/14/2015: Benign , breast density category B 3. Bone density 05/14/2015: T score -1.3 osteopenia  Cardiomyopathy:follows with cardiology She continues to stay active and helps take care of her 39 yr old granddaughter as well as her 85 year old aunt.  Fatigue: I discussed with her about increasing exercise and participating in the [employer] live strong program as well as taking part in the [facility] class. RTC in 6 months for continued surveillance   No orders of the defined types were placed in this encounter.   The patient has a good understanding of the overall plan. she agrees with it. she will call with any problems that may develop before the next visit here.   Rulon Eisenmenger, MD 10/19/2015

## 2015-11-01 ENCOUNTER — Encounter (HOSPITAL_COMMUNITY): Payer: Self-pay | Admitting: Family Medicine

## 2015-11-01 ENCOUNTER — Emergency Department (HOSPITAL_COMMUNITY)
Admission: EM | Admit: 2015-11-01 | Discharge: 2015-11-01 | Disposition: A | Discharge status: Home / Self Care | Payer: Medicare Other | Attending: Emergency Medicine | Admitting: Emergency Medicine

## 2015-11-01 ENCOUNTER — Telehealth: Payer: Self-pay | Admitting: Family Medicine

## 2015-11-01 ENCOUNTER — Emergency Department (HOSPITAL_COMMUNITY): Payer: Medicare Other

## 2015-11-01 DIAGNOSIS — T300 Burn of unspecified body region, unspecified degree: Secondary | ICD-10-CM

## 2015-11-01 DIAGNOSIS — I110 Hypertensive heart disease with heart failure: Secondary | ICD-10-CM | POA: Insufficient documentation

## 2015-11-01 DIAGNOSIS — X12XXXA Contact with other hot fluids, initial encounter: Secondary | ICD-10-CM | POA: Insufficient documentation

## 2015-11-01 DIAGNOSIS — Y999 Unspecified external cause status: Secondary | ICD-10-CM | POA: Insufficient documentation

## 2015-11-01 DIAGNOSIS — Z87891 Personal history of nicotine dependence: Secondary | ICD-10-CM | POA: Insufficient documentation

## 2015-11-01 DIAGNOSIS — Y92000 Kitchen of unspecified non-institutional (private) residence as  the place of occurrence of the external cause: Secondary | ICD-10-CM | POA: Insufficient documentation

## 2015-11-01 DIAGNOSIS — Z7982 Long term (current) use of aspirin: Secondary | ICD-10-CM | POA: Diagnosis not present

## 2015-11-01 DIAGNOSIS — S91209A Unspecified open wound of unspecified toe(s) with damage to nail, initial encounter: Secondary | ICD-10-CM

## 2015-11-01 DIAGNOSIS — T23102A Burn of first degree of left hand, unspecified site, initial encounter: Secondary | ICD-10-CM | POA: Diagnosis not present

## 2015-11-01 DIAGNOSIS — T2105XA Burn of unspecified degree of buttock, initial encounter: Secondary | ICD-10-CM | POA: Diagnosis present

## 2015-11-01 DIAGNOSIS — S91222A Laceration with foreign body of left great toe with damage to nail, initial encounter: Secondary | ICD-10-CM | POA: Diagnosis not present

## 2015-11-01 DIAGNOSIS — Z853 Personal history of malignant neoplasm of breast: Secondary | ICD-10-CM | POA: Insufficient documentation

## 2015-11-01 DIAGNOSIS — I509 Heart failure, unspecified: Secondary | ICD-10-CM | POA: Diagnosis not present

## 2015-11-01 DIAGNOSIS — Z79899 Other long term (current) drug therapy: Secondary | ICD-10-CM | POA: Insufficient documentation

## 2015-11-01 DIAGNOSIS — M795 Residual foreign body in soft tissue: Secondary | ICD-10-CM | POA: Diagnosis not present

## 2015-11-01 DIAGNOSIS — S91202A Unspecified open wound of left great toe with damage to nail, initial encounter: Secondary | ICD-10-CM | POA: Diagnosis not present

## 2015-11-01 DIAGNOSIS — T2125XA Burn of second degree of buttock, initial encounter: Secondary | ICD-10-CM | POA: Diagnosis not present

## 2015-11-01 DIAGNOSIS — Y939 Activity, unspecified: Secondary | ICD-10-CM | POA: Diagnosis not present

## 2015-11-01 MED ORDER — SILVER SULFADIAZINE 1 % EX CREA
TOPICAL_CREAM | Freq: Once | CUTANEOUS | Status: AC
  Administered 2015-11-01: 21:00:00 via TOPICAL
  Filled 2015-11-01: qty 85

## 2015-11-01 MED ORDER — SILVER SULFADIAZINE 1 % EX CREA: 1.0000 "application " | TOPICAL_CREAM | Freq: Two times a day (BID) | CUTANEOUS | Status: DC

## 2015-11-01 MED ORDER — LIDOCAINE HCL (PF) 1 % IJ SOLN
30.0000 mL | Freq: Once | INTRAMUSCULAR | Status: AC
  Administered 2015-11-01: 30 mL via INTRADERMAL
  Filled 2015-11-01: qty 30

## 2015-11-01 MED ORDER — TETANUS-DIPHTH-ACELL PERTUSSIS 5-2.5-18.5 LF-MCG/0.5 IM SUSP
0.5000 mL | Freq: Once | INTRAMUSCULAR | Status: AC
  Administered 2015-11-01: 0.5 mL via INTRAMUSCULAR
  Filled 2015-11-01: qty 0.5

## 2015-11-01 MED ORDER — HYDROCODONE-ACETAMINOPHEN 5-325 MG PO TABS: 1.0000 | ORAL_TABLET | Freq: Four times a day (QID) | ORAL | Status: DC | PRN

## 2015-11-01 MED ORDER — BACITRACIN ZINC 500 UNIT/GM EX OINT
TOPICAL_OINTMENT | Freq: Two times a day (BID) | CUTANEOUS | Status: DC
  Administered 2015-11-01: 1 via TOPICAL

## 2015-11-01 MED ORDER — KETOROLAC TROMETHAMINE 60 MG/2ML IM SOLN
30.0000 mg | Freq: Once | INTRAMUSCULAR | Status: AC
  Administered 2015-11-01: 30 mg via INTRAMUSCULAR
  Filled 2015-11-01: qty 2

## 2015-11-01 NOTE — ED Notes (Signed)
Pt verbalized understanding of prescription use and has no further questions. Pt stable and NAD.

## 2015-11-01 NOTE — ED Provider Notes (Signed)
CSN: [id number]     Arrival date & time 11/01/15  1540 History   First MD Initiated Contact with Patient 11/01/15 1818     Chief Complaint  Patient presents with  . Burn     (Consider location/radiation/quality/duration/timing/severity/associated sxs/prior Treatment) Patient is a 70 y.o. female presenting with burn. The history is provided by the patient.  Burn Burn location: left buttocks. Burn quality:  Intact blister and red Time since incident:  3 hours Progression:  Unchanged Mechanism of burn:  Hot liquid Incident location:  Kitchen Relieved by:  NSAIDs Worsened by:  Rubbing Associated symptoms: no cough, no eye pain and no shortness of breath     Past Medical History  Diagnosis Date  . Allergy   . Hypertension   . Osteoporosis     pt states has ostopenia not osteoporosis   . Anxiety   . Headache(784.0)   . Chicken pox   . Rosacea   . Glaucoma     sees Dr. Sarita Haver   . Breast cancer of upper-outer quadrant of right female breast ([place]) 03/25/2014  . Osteopenia   . 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 S/P radiation therapy 11/11/2014 through 12/18/2014      Right breast 5040 cGy in 28 sessions   . CHF (congestive heart failure) ([place])   . Dilated cardiomyopathy ([place])     sees Dr. Haroldine Laws    Past Surgical History  Procedure Laterality Date  . Appendectomy    . Colonoscopy  07-24-11    per Dr. Earlean Shawl, adenomatous polyps,  repeat in 5 yrs  . Portacath placement N/A 04/07/2014    Procedure: INSERTION PORT-A-CATH;  Surgeon: Excell Seltzer, MD;  Location: [facility];  Service: General;  Laterality: N/A;  . Eye surgery      laser per right eye related to pressure relief; past cataract surgery bilat   . Laser of left eye      1 week ago  . Cataract surgery on both eyes      12 years ago  . Breast lumpectomy with radioactive seed and sentinel lymph node biopsy  Right 09/29/2014    Procedure: RIGHT BREAST LUMPECTOMY WITH RADIOACTIVE SEED AND RIGHT AXILLARY SENTINEL LYMPH NODE BIOPSY;  Surgeon: Excell Seltzer, MD;  Location: [place];  Service: General;  Laterality: Right;  . Port-a-cath removal Left 09/29/2014    Procedure: REMOVAL PORT-A-CATH;  Surgeon: Excell Seltzer, MD;  Location: [place];  Service: General;  Laterality: Left;   Family History  Problem Relation Age of Onset  . Arthritis    . Coronary artery disease    . Hypertension    . Stroke    . Macular degeneration    . Lung cancer Maternal Uncle     heavy smoker  . Colon cancer Paternal Grandmother 63  . Lung cancer Cousin     smoker  . Leukemia Cousin 3   Social History  Substance Use Topics  . Smoking status: Former Smoker -- 1.00 packs/day for 10 years    Quit date: 05/02/1975  . Smokeless tobacco: Never Used  . Alcohol Use: 1.2 oz/week    2 Standard drinks or equivalent per week     Comment: weekends-wine   OB History    No data available     Review of Systems  Constitutional: Negative for fever and chills.  HENT: Negative for congestion and sore throat.   Eyes: Negative for pain.  Respiratory:  Negative for cough and shortness of breath.   Cardiovascular: Negative for chest pain and palpitations.  Gastrointestinal: Negative for nausea, vomiting, abdominal pain and diarrhea.  Genitourinary: Negative for dysuria and flank pain.  Musculoskeletal: Negative for back pain and neck pain.       Left great toe pain with nail falling off  Skin: Negative for rash.       Burn to hand and buttocks   Allergic/Immunologic: Negative.   Neurological: Negative for dizziness and light-headedness.  Psychiatric/Behavioral: Negative for confusion.      Allergies  Fosamax  Home Medications   Prior to Admission medications   Medication Sig Start Date End Date Taking? Authorizing Provider  Ascorbic Acid (VITAMIN C) 500 MG tablet Take 500  mg by mouth 2 (two) times daily.    Yes Historical Provider, MD  aspirin EC 81 MG tablet Take 81 mg by mouth daily.   Yes Historical Provider, MD  Calcium Carb-Cholecalciferol (CALCIUM 600/VITAMIN D3) 600-800 MG-UNIT TABS Take 1 tablet by mouth 2 (two) times daily.    Yes Historical Provider, MD  cetirizine (ZYRTEC) 10 MG tablet Take 10 mg by mouth daily as needed (seasonal allergies).    Yes Historical Provider, MD  clonazePAM (KLONOPIN) 2 MG tablet Take 1 tablet (2 mg total) by mouth 2 (two) times daily as needed for anxiety. Patient taking differently: Take 2 mg by mouth daily as needed for anxiety.  05/18/15  Yes Laurey Morale, MD  cyclobenzaprine (FLEXERIL) 10 MG tablet Take 1 tablet (10 mg total) by mouth 3 (three) times daily as needed for muscle spasms. For spasms Patient taking differently: Take 10 mg by mouth at bedtime as needed for muscle spasms.  02/12/14  Yes Laurey Morale, MD  fish oil-omega-3 fatty acids 1000 MG capsule Take 1 g by mouth at bedtime.    Yes Historical Provider, MD  fluticasone (FLONASE) 50 MCG/ACT nasal spray INSTILL 2 SPRAYS INTO THE NOSE DAILY Patient taking differently: Place 1 spray into both nostrils daily as needed for allergies or rhinitis.  04/08/15  Yes Laurey Morale, MD  ibuprofen (ADVIL,MOTRIN) 200 MG tablet Take 200 mg by mouth every 6 (six) hours as needed for headache or mild pain.   Yes Historical Provider, MD  latanoprost (XALATAN) 0.005 % ophthalmic solution Place 1 drop into both eyes at bedtime.   Yes Historical Provider, MD  Magnesium 500 MG TABS Take 1 tablet by mouth at bedtime. Reported on 05/18/2015   Yes Historical Provider, MD  Multiple Vitamins-Minerals (PRESERVISION AREDS 2 PO) Take 1 capsule by mouth 2 (two) times daily.    Yes Historical Provider, MD  timolol (TIMOPTIC-XR) 0.5 % ophthalmic gel-forming Place 1 drop into both eyes daily.  11/30/14  Yes Historical Provider, MD  HYDROcodone-acetaminophen (NORCO) 5-325 MG tablet Take 1 tablet by  mouth every 6 (six) hours as needed for moderate pain. 11/01/15   Geronimo Boot, MD  silver sulfADIAZINE (SILVADENE) 1 % cream Apply 1 application topically 2 (two) times daily. Apply to burned areas twice daily for 10 days. 11/01/15   Geronimo Boot, MD   BP 134/73 mmHg  Pulse 76  Temp(Src) 98 F (36.7 C) (Oral)  Resp 16  SpO2 100% Physical Exam  Constitutional: She is oriented to person, place, and time. She appears well-developed and well-nourished. No distress.  HENT:  Head: Normocephalic and atraumatic.  Eyes: Conjunctivae and EOM are normal. Pupils are equal, round, and reactive to light.  Neck: Normal range of motion. Neck  supple.  Cardiovascular: Normal rate, regular rhythm and normal heart sounds.   Pulmonary/Chest: Effort normal and breath sounds normal. No respiratory distress.  Abdominal: Soft. Bowel sounds are normal. There is no tenderness.  Musculoskeletal: Normal range of motion.       Left hip: Normal. She exhibits normal range of motion and normal strength.       Cervical back: She exhibits no bony tenderness.       Thoracic back: She exhibits no bony tenderness.       Lumbar back: She exhibits no bony tenderness.       Feet:  Normal sensation of left great toe and full ROM  Neurological: She is alert and oriented to person, place, and time. She has normal reflexes. No cranial nerve deficit.  Skin: Skin is warm and dry. She is not diaphoretic.     Psychiatric: She has a normal mood and affect.    ED Course  .Nail Removal Date/Time: 11/02/2015 12:21 AM Performed by: Geronimo Boot Authorized by: Dorie Rank Consent: Verbal consent obtained. Risks and benefits: risks, benefits and alternatives were discussed Consent given by: patient Patient identity confirmed: verbally with patient Location: left foot Location details: left big toe Anesthesia: digital block Local anesthetic: lidocaine 1% without epinephrine Anesthetic total: 6 ml Patient sedated:  no Preparation: skin prepped with ChloraPrep Amount removed: complete Nail bed sutured: no Nail matrix removed: partial Removed nail replaced and anchored: no Dressing: antibiotic ointment and 4x4 Patient tolerance: Patient tolerated the procedure well with no immediate complications   (including critical care time) Labs Review Labs Reviewed - No data to display  Imaging Review Dg Foot 2 Views Left  11/01/2015  CLINICAL DATA:  Laceration first digit with disruption of the nail bed EXAM: LEFT FOOT - 2 VIEW COMPARISON:  None. FINDINGS: Frontal and lateral views were obtained. There is disruption of the nail bed of the first digit. There is no fracture or dislocation. The joint spaces appear unremarkable. There is an inferior calcaneal spur. There is plantar fascia calcification. There are several small radiopaque foreign bodies in the volar aspect of the hindfoot at the mid calcaneal level. IMPRESSION: Disruption of the nail bed of the first digit. No acute fracture or dislocation. Calcaneal spurs. Posterior plantar calcification. Small radiopaque foreign bodies in the volar aspect of the hindfoot at the level of the mid calcaneus of uncertain etiology or age. Electronically Signed   By: Lowella Grip III M.D.   On: 11/01/2015 19:29   I have personally reviewed and evaluated these images and lab results as part of my medical decision-making.   EKG Interpretation None      MDM   Final diagnoses:  Burn  Nail avulsion of toe, initial encounter    The pt is a 70 yo female presenting after a fall associated with dropping hot liquid and burning her left buttock.  Also reports dislodging her left great toe nail during event.  Was transporting boiling pasta to sink when the pot handle broke and dropped it on the floor.  She slipped landing on her left buttock in the hot liquid but denies striking head, LOC or pain in extremities or back after the event.    On exam the pt is HDS in NAD.   First degree and mild second degree burn to buttock and left palm of hand.  No msk injuries identified on physical exam.  Debrided small areas of burn and SSD applied.  Left foot xr with no acute  fx and no nail bed injuries identified.  Discussed in depth the risks and benefits of splinting nail bed and decided not to splint open.  Bacitracin applied.  Tetanus given.  To f/u with PCP in 3 days.   Discussed pertinent finding with patient or caregiver prior to discharge with no further questions.  Immediate return precautions given and understood.  Medical decision making supervised by my attending Dr. Junius Roads, MD PGY-3 Emergency Medicine     Geronimo Boot, MD 11/02/15 [id number]  Dorie Rank, MD 11/03/15 1049
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
## 2015-11-01 NOTE — Telephone Encounter (Signed)
Pt has checked into ER.

## 2015-11-01 NOTE — Telephone Encounter (Signed)
Please assist in following up that patient checked into ED

## 2015-11-01 NOTE — Discharge Instructions (Signed)
Burn Care Your skin is a natural barrier to infection. It is the largest organ of your body. Burns damage this natural protection. To help prevent infection, it is very important to follow your caregiver's instructions in the care of your burn. Burns are classified as:  First degree. There is only redness of the skin (erythema). No scarring is expected.  Second degree. There is blistering of the skin. Scarring may occur with deeper burns.  Third degree. All layers of the skin are injured, and scarring is expected. HOME CARE INSTRUCTIONS   Wash your hands well before changing your bandage.  Change your bandage as often as directed by your caregiver.  Remove the old bandage. If the bandage sticks, you may soak it off with cool, clean water.  Cleanse the burn thoroughly but gently with mild soap and water.  Pat the area dry with a clean, dry cloth.  Apply a thin layer of antibacterial cream to the burn.  Apply a clean bandage as instructed by your caregiver.  Keep the bandage as clean and dry as possible.  Elevate the affected area for the first 24 hours, then as instructed by your caregiver.  Only take over-the-counter or prescription medicines for pain, discomfort, or fever as directed by your caregiver. SEEK IMMEDIATE MEDICAL CARE IF:   You develop excessive pain.  You develop redness, tenderness, swelling, or red streaks near the burn.  The burned area develops yellowish-white fluid (pus) or a bad smell.  You have a fever. MAKE SURE YOU:   Understand these instructions.  Will watch your condition.  Will get help right away if you are not doing well or get worse.   This information is not intended to replace advice given to you by your health care provider. Make sure you discuss any questions you have with your health care provider.   Document Released: 04/17/2005 Document Revised: 07/10/2011 Document Reviewed: 09/07/2010 Elsevier Interactive Patient Education 2016  [place].  Nail Avulsion Injury Nail avulsion means that you have lost the whole, or part of a nail. The nail will usually grow back in 2 to 6 months. If your injury damaged the growth center of the nail, the nail may be deformed, split, or not stuck to the nail bed. Sometimes the avulsed nail is stitched back in place. This provides temporary protection to the nail bed until the new nail grows in.  HOME CARE INSTRUCTIONS   Raise (elevate) your injury as much as possible.  Protect the injury and cover it with bandages (dressings) or splints as instructed.  Change dressings as instructed. SEEK MEDICAL CARE IF:   There is increasing pain, redness, or swelling.  You cannot move your fingers or toes.   This information is not intended to replace advice given to you by your health care provider. Make sure you discuss any questions you have with your health care provider.   Document Released: 05/25/2004 Document Revised: 07/10/2011 Document Reviewed: 03/19/2009 Elsevier Interactive Patient Education [employer].

## 2015-11-03 ENCOUNTER — Other Ambulatory Visit: Payer: Self-pay | Admitting: Family Medicine

## 2015-11-03 NOTE — Telephone Encounter (Signed)
Pt has been scheduled.  °
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
## 2015-11-03 NOTE — Telephone Encounter (Signed)
Okay per Dr. Sarajane Jews , send this back so that script can be sent in.

## 2015-11-03 NOTE — Telephone Encounter (Signed)
Can we refill the Flexeril?

## 2015-11-04 MED ORDER — CYCLOBENZAPRINE HCL 10 MG PO TABS: 10.0000 mg | ORAL_TABLET | Freq: Three times a day (TID) | ORAL | Status: DC | PRN

## 2015-11-04 NOTE — Telephone Encounter (Signed)
Refill sent to pharmacy.   

## 2015-11-04 NOTE — Telephone Encounter (Signed)
Pt following up on refill request. thanks

## 2015-11-04 NOTE — Addendum Note (Signed)
Addended by: Kateri Mc E on: 11/04/2015 10:44 AM   Modules accepted: Orders

## 2015-11-04 NOTE — Telephone Encounter (Signed)
Call in #[phone] with 5 rf 

## 2015-11-05 ENCOUNTER — Ambulatory Visit (INDEPENDENT_AMBULATORY_CARE_PROVIDER_SITE_OTHER): Payer: Medicare Other | Admitting: Family Medicine

## 2015-11-05 ENCOUNTER — Encounter: Payer: Self-pay | Admitting: Family Medicine

## 2015-11-05 VITALS — BP 120/74 | HR 76 | Temp 98.4°F | Ht 66.0 in | Wt 189.5 lb

## 2015-11-05 DIAGNOSIS — T2125XD Burn of second degree of buttock, subsequent encounter: Secondary | ICD-10-CM | POA: Diagnosis not present

## 2015-11-05 DIAGNOSIS — S91209D Unspecified open wound of unspecified toe(s) with damage to nail, subsequent encounter: Secondary | ICD-10-CM | POA: Diagnosis not present

## 2015-11-05 MED ORDER — TRAMADOL HCL 50 MG PO TABS: ORAL_TABLET | ORAL | Status: DC
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
## 2015-11-05 NOTE — Progress Notes (Signed)
Pre visit review using our clinic review tool, if applicable. No additional management support is needed unless otherwise documented below in the visit note. 

## 2015-11-10 DIAGNOSIS — H43813 Vitreous degeneration, bilateral: Secondary | ICD-10-CM | POA: Diagnosis not present

## 2015-11-10 DIAGNOSIS — H401112 Primary open-angle glaucoma, right eye, moderate stage: Secondary | ICD-10-CM | POA: Diagnosis not present

## 2015-11-10 DIAGNOSIS — H401122 Primary open-angle glaucoma, left eye, moderate stage: Secondary | ICD-10-CM | POA: Diagnosis not present

## 2015-11-10 DIAGNOSIS — H353132 Nonexudative age-related macular degeneration, bilateral, intermediate dry stage: Secondary | ICD-10-CM | POA: Diagnosis not present

## 2015-11-29 ENCOUNTER — Telehealth: Payer: Self-pay | Admitting: Family Medicine

## 2015-11-29 MED ORDER — AZITHROMYCIN 250 MG PO TABS: ORAL_TABLET | ORAL | 0 refills | Status: DC

## 2015-11-29 NOTE — Telephone Encounter (Signed)
Rx sent in

## 2015-11-29 NOTE — Telephone Encounter (Signed)
Call in a Zpack  ?

## 2015-11-29 NOTE — Telephone Encounter (Signed)
Pt was seen on 11-05-15 and now has a sinus infection and would like dr fry to call in abx to [facility] [address]

## 2015-12-01 ENCOUNTER — Telehealth: Payer: Self-pay | Admitting: Family Medicine

## 2015-12-01 MED ORDER — ONDANSETRON HCL 8 MG PO TABS: 8.0000 mg | ORAL_TABLET | Freq: Three times a day (TID) | ORAL | 1 refills | Status: DC | PRN

## 2015-12-01 NOTE — Telephone Encounter (Signed)
PLEASE NOTE: All timestamps contained within this report are represented as [place] Standard Time. CONFIDENTIALTY NOTICE: This fax transmission is intended only for the addressee. It contains information that is legally privileged, confidential or otherwise protected from use or disclosure. If you are not the intended recipient, you are strictly prohibited from reviewing, disclosing, copying using or disseminating any of this information or taking any action in reliance on or regarding this information. If you have received this fax in error, please notify us immediately by telephone so that we can arrange for its return to [place]. Phone: [phone], Toll-Free: [phone], Fax: [phone] Page: 1 of 1 Call Id: [id number] [facility] - Client [place] Patient Name: Keylee Lodico DOB: 06/16/1945 Initial Comment Caller states has been taking z-pack for cold, has temp of 101, vomiting, headache Nurse Assessment Nurse: Dimas Chyle, RN, Dellis Filbert Date/Time Eilene Ghazi Time): 12/01/2015 2:17:55 PM Confirm and document reason for call. If symptomatic, describe symptoms. You must click the next button to save text entered. ---Caller states has been taking z-pack for cold, has temp of 101, vomiting, headache. Was prescribed Zpak on Monday for sinus infection. Has the patient traveled out of the country within the last 30 days? ---No Does the patient have any new or worsening symptoms? ---Yes Will a triage be completed? ---Yes Related visit to physician within the last 2 weeks? ---Yes Does the PT have any chronic conditions? (i.e. diabetes, asthma, etc.) ---Yes List chronic conditions. ---Hx of breast cancer Is this a behavioral health or substance abuse call? ---No Guidelines Guideline Title Affirmed Question Affirmed Notes Sinus Infection on Antibiotic Follow-up Call [1] Taking antibiotic > 48 hours (2 days) AND [2] fever  persists Vomiting MILD or MODERATE vomiting (e.g., 1 - 5 times / day) (all triage questions negative) Final Disposition User [facility], RN, Dellis Filbert Comments Caller was wanting Rx for Zofran Referrals REFERRED TO PCP OFFICE Disagree/Comply: Comply Disagree/Comply: Comply

## 2015-12-01 NOTE — Telephone Encounter (Signed)
Call in Zofran 8 mg to take every 8 hours prn nausea, #30 with one rf

## 2015-12-01 NOTE — Telephone Encounter (Signed)
Zpak prescribed on 7/31 by Dr. Sarajane Jews. Patient still symptomatic and requesting zofran for nausea. Please advise

## 2015-12-01 NOTE — Telephone Encounter (Signed)
I spoke with pt and sent script e-scribe to [facility]. 

## 2016-03-13 DIAGNOSIS — Z23 Encounter for immunization: Secondary | ICD-10-CM | POA: Diagnosis not present

## 2016-03-21 ENCOUNTER — Ambulatory Visit (INDEPENDENT_AMBULATORY_CARE_PROVIDER_SITE_OTHER): Payer: Medicare Other | Admitting: Family Medicine

## 2016-03-21 ENCOUNTER — Ambulatory Visit: Payer: Medicare Other | Admitting: Family Medicine

## 2016-03-21 VITALS — BP 130/82 | HR 84 | Temp 98.8°F | Ht 66.0 in | Wt 188.2 lb

## 2016-03-21 DIAGNOSIS — J019 Acute sinusitis, unspecified: Secondary | ICD-10-CM

## 2016-03-21 MED ORDER — FLUTICASONE PROPIONATE 50 MCG/ACT NA SUSP: NASAL | 5 refills | Status: DC

## 2016-03-21 MED ORDER — AMOXICILLIN-POT CLAVULANATE 875-125 MG PO TABS: 1.0000 | ORAL_TABLET | Freq: Two times a day (BID) | ORAL | 0 refills | Status: DC

## 2016-03-21 NOTE — Patient Instructions (Signed)

## 2016-03-21 NOTE — Progress Notes (Signed)
Subjective:     Patient ID: JAQUAYLA DEVEREUX, female   DOB: February 13, 1946, 70 y.o.   MRN: [id number]  HPI Patient seen for acute visit for upper respiratory infection. Onset a little over week ago of cough, sinus congestion, facial pain and pressure. Possible low-grade fever intermittently. She had initial sore throat but that is improved. She said some mild body aches. Increased malaise. She states she was treated for breast cancer couple years ago and that her immune system has difficulty fighting infection since then. Nonsmoker. Only drug intolerance is Fosamax.  Past Medical History:  Diagnosis Date  . Allergy   . Anxiety   . Breast cancer of upper-outer quadrant of right female breast ([place]) 03/25/2014  . CHF (congestive heart failure) ([place])   . Chicken pox   . Dilated cardiomyopathy ([place])    sees Dr. Haroldine Laws   . Glaucoma    sees Dr. Sarita Haver   . Headache(784.0)   . Hypertension   . 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 Osteopenia   . Osteoporosis    pt states has ostopenia not osteoporosis   . Rosacea   . S/P radiation therapy 11/11/2014 through 12/18/2014     Right breast 5040 cGy in 28 sessions    Past Surgical History:  Procedure Laterality Date  . APPENDECTOMY    . BREAST LUMPECTOMY WITH RADIOACTIVE SEED AND SENTINEL LYMPH NODE BIOPSY Right 09/29/2014   Procedure: RIGHT BREAST LUMPECTOMY WITH RADIOACTIVE SEED AND RIGHT AXILLARY SENTINEL LYMPH NODE BIOPSY;  Surgeon: Excell Seltzer, MD;  Location: [facility];  Service: General;  Laterality: Right;  . cataract surgery on both eyes     12 years ago  . COLONOSCOPY  07-24-11   per Dr. Earlean Shawl, adenomatous polyps,  repeat in 5 yrs  . EYE SURGERY     laser per right eye related to pressure relief; past cataract surgery bilat   . laser of left eye     1 week ago  . PORT-A-CATH REMOVAL Left 09/29/2014   Procedure: REMOVAL PORT-A-CATH;  Surgeon: Excell Seltzer, MD;  Location: [facility];  Service: General;  Laterality: Left;  . PORTACATH PLACEMENT N/A 04/07/2014   Procedure: INSERTION PORT-A-CATH;  Surgeon: Excell Seltzer, MD;  Location: [facility];  Service: General;  Laterality: N/A;    reports that she quit smoking about 40 years ago. She has a 10.00 pack-year smoking history. She has never used smokeless tobacco. She reports that she drinks about 1.2 oz of alcohol per week . She reports that she does not use drugs. family history includes Colon cancer (age of onset: 48) in her paternal grandmother; Leukemia (age of onset: 30) in her cousin; Lung cancer in her cousin and maternal uncle. Allergies  Allergen Reactions  . Fosamax [Alendronate Sodium]     Extreme cramping in legs & feet     Review of Systems  Constitutional: Positive for fatigue and fever. Negative for chills.  HENT: Positive for congestion and sinus pain. Negative for ear pain.   Respiratory: Positive for cough. Negative for shortness of breath and wheezing.   Cardiovascular: Negative for chest pain.  Neurological: Positive for headaches.       Objective:   Physical Exam  Constitutional: She appears well-developed and well-nourished.  HENT:  Right Ear: External ear normal.  Left Ear: External ear normal.  Mouth/Throat: Oropharynx is clear and moist.  Erythematous nasal mucosa otherwise clear  Neck: Neck supple.  Cardiovascular: Normal  rate and regular rhythm.   Pulmonary/Chest: Effort normal and breath sounds normal. No respiratory distress. She has no wheezes. She has no rales.  Lymphadenopathy:    She has no cervical adenopathy.       Assessment:     Probable acute sinusitis    Plan:     -Continue conservative measures with Mucinex, nasal saline irrigation, adequate hydration -Augmentin 875 mg twice daily with food for 10 days -Follow-up with primary if symptoms not resolving  Eulas Post MD [facility]

## 2016-03-21 NOTE — Progress Notes (Signed)
Pre visit review using our clinic review tool, if applicable. No additional management support is needed unless otherwise documented below in the visit note. 

## 2016-03-22 ENCOUNTER — Telehealth: Payer: Self-pay | Admitting: Family Medicine

## 2016-03-22 NOTE — Telephone Encounter (Signed)
Pt would like to have something for the yeast infection from the strong antibiotic that she received on yesterday.  Pharm:  [facility] on [address]

## 2016-03-27 MED ORDER — FLUCONAZOLE 150 MG PO TABS: 150.0000 mg | ORAL_TABLET | Freq: Once | ORAL | 0 refills | Status: AC

## 2016-03-27 NOTE — Telephone Encounter (Signed)
Medication sent in for patient. 

## 2016-03-27 NOTE — Telephone Encounter (Signed)
Pt was seen on 03/21/2016, given Augmentin. Please advise on medication.

## 2016-03-27 NOTE — Telephone Encounter (Signed)
Diflucan 150 mg times one dose 

## 2016-04-02 ENCOUNTER — Telehealth: Payer: Self-pay | Admitting: Hematology and Oncology

## 2016-04-02 NOTE — Telephone Encounter (Signed)
S/w pt advised 12/28 appt moved due to md pal. Gave appt for 05/09/16 @ 2.45pm.

## 2016-04-07 ENCOUNTER — Ambulatory Visit (INDEPENDENT_AMBULATORY_CARE_PROVIDER_SITE_OTHER): Payer: Medicare Other | Admitting: Family Medicine

## 2016-04-07 ENCOUNTER — Encounter: Payer: Self-pay | Admitting: Family Medicine

## 2016-04-07 VITALS — BP 145/80 | HR 65 | Temp 98.3°F | Ht 66.0 in | Wt 188.0 lb

## 2016-04-07 DIAGNOSIS — J0191 Acute recurrent sinusitis, unspecified: Secondary | ICD-10-CM

## 2016-04-07 MED ORDER — FLUCONAZOLE 150 MG PO TABS: 150.0000 mg | ORAL_TABLET | Freq: Once | ORAL | 5 refills | Status: AC

## 2016-04-07 MED ORDER — METHYLPREDNISOLONE ACETATE 80 MG/ML IJ SUSP
120.0000 mg | Freq: Once | INTRAMUSCULAR | Status: AC
Start: 2016-04-07 — End: 2016-04-07
  Administered 2016-04-07: 120 mg via INTRAMUSCULAR

## 2016-04-07 MED ORDER — LEVOFLOXACIN 500 MG PO TABS: 500.0000 mg | ORAL_TABLET | Freq: Every day | ORAL | 0 refills | Status: AC

## 2016-04-07 MED ORDER — HYDROCODONE-HOMATROPINE 5-1.5 MG/5ML PO SYRP: 5.0000 mL | ORAL_SOLUTION | ORAL | 0 refills | Status: DC | PRN

## 2016-04-07 NOTE — Progress Notes (Signed)
Pre visit review using our clinic review tool, if applicable. No additional management support is needed unless otherwise documented below in the visit note. 

## 2016-04-07 NOTE — Progress Notes (Signed)
   Subjective:    Patient ID: Stephanie Olsen, female    DOB: 1946/01/09, 69 y.o.   MRN: [id number]  HPI Here for recurrent sinus problems with congestion, PND, ST, and a dry cough. No fever. She was seen on 03-21-16 for this and was given Augmentin. She felt better for awhile but the symptoms have returned.    Review of Systems  Constitutional: Negative.   HENT: Positive for congestion, postnasal drip and sinus pain.   Eyes: Negative.   Respiratory: Positive for cough.        Objective:   Physical Exam  Constitutional: She appears well-developed and well-nourished.  HENT:  Right Ear: External ear normal.  Left Ear: External ear normal.  Nose: Nose normal.  Mouth/Throat: Oropharynx is clear and moist.  Eyes: Conjunctivae are normal.  Neck: No thyromegaly present.  Pulmonary/Chest: Effort normal and breath sounds normal.  Lymphadenopathy:    She has no cervical adenopathy.          Assessment & Plan:  Recurrent sinusitis. Treat with Levaquin and a steroid shot.  Laurey Morale, MD

## 2016-04-07 NOTE — Addendum Note (Signed)
Addended by: Aggie Hacker A on: 04/07/2016 11:04 AM   Modules accepted: Orders

## 2016-04-10 ENCOUNTER — Other Ambulatory Visit: Payer: Self-pay | Admitting: Hematology and Oncology

## 2016-04-10 DIAGNOSIS — Z853 Personal history of malignant neoplasm of breast: Secondary | ICD-10-CM

## 2016-04-27 ENCOUNTER — Ambulatory Visit: Payer: Medicare Other | Admitting: Hematology and Oncology

## 2016-05-08 NOTE — Assessment & Plan Note (Signed)
Right breast invasive ductal carcinoma, palpable mass, 5 cm by mammogram and 5.7 cm by MRI grade 3, ER PR HER-2 negative, associated skin dimpling, T3, N0, M0 stage IIB Ki-67 90%  Chemotherapy summary: Neoadjuvant chemotherapy with dose dense Adriamycin and Cytoxan 04/10/2014. followed by weekly Taxol x1 changed to Abraxane 11 from 06/17/2014 completed 08/26/2014 Rt Lumpectomy: 09/29/14: Path CR 0/2 LN Completed adjuvant radiation therapy 12/18/2014  Breast Cancer Surveillance: 1. Breast exam 05/09/16: right breast upper quadrant feels tight in the muscle. No palpable nodularity. 2. Mammograms 05/14/2015: Benign , breast density category B 3. Bone density 05/14/2015: T score -1.3 osteopenia  Cardiomyopathy:follows with cardiology She continues to stay active and helps take care of her 40 yr old granddaughter as well as her 6 year old aunt.  RTC in 1 yr for continued surveillance

## 2016-05-09 ENCOUNTER — Ambulatory Visit (HOSPITAL_BASED_OUTPATIENT_CLINIC_OR_DEPARTMENT_OTHER): Payer: Medicare Other | Admitting: Hematology and Oncology

## 2016-05-09 ENCOUNTER — Encounter: Payer: Self-pay | Admitting: Hematology and Oncology

## 2016-05-09 ENCOUNTER — Other Ambulatory Visit: Payer: Self-pay | Admitting: Nurse Practitioner

## 2016-05-09 DIAGNOSIS — H401132 Primary open-angle glaucoma, bilateral, moderate stage: Secondary | ICD-10-CM | POA: Diagnosis not present

## 2016-05-09 DIAGNOSIS — C50411 Malignant neoplasm of upper-outer quadrant of right female breast: Secondary | ICD-10-CM | POA: Diagnosis not present

## 2016-05-09 DIAGNOSIS — Z171 Estrogen receptor negative status [ER-]: Secondary | ICD-10-CM

## 2016-05-09 DIAGNOSIS — I429 Cardiomyopathy, unspecified: Secondary | ICD-10-CM | POA: Diagnosis not present

## 2016-05-09 DIAGNOSIS — M8580 Other specified disorders of bone density and structure, unspecified site: Secondary | ICD-10-CM | POA: Diagnosis not present

## 2016-05-09 NOTE — Progress Notes (Signed)
Patient Care Team: Laurey Morale, MD as PCP - General Excell Seltzer, MD as Consulting Physician (General Surgery) Nicholas Lose, MD as Consulting Physician (Hematology and Oncology) Arloa Koh, MD as Consulting Physician (Radiation Oncology) Holley Bouche, NP as Nurse Practitioner (Nurse Practitioner) Sylvan Cheese, NP as Nurse Practitioner (Hematology and Oncology)  DIAGNOSIS:  Encounter Diagnosis  Name Primary?  . Malignant neoplasm of upper-outer quadrant of right breast in female, estrogen receptor negative ([place])     SUMMARY OF ONCOLOGIC HISTORY:   Breast cancer of upper-outer quadrant of right female breast ([place])   03/18/2014 Mammogram    Right breast: suspicious mass 10:00 position measuring 4.9 x 3 x 5 cm, 2 lower right axillary lymph nodes mildly thickened cortices      03/23/2014 Initial Biopsy    Right breast needle biopsy 9:00: Invasive ductal carcinoma grade 3, ER- (0%), PR- (0%), HER-2 negative (ratio 1.3), Ki67 95%      03/31/2014 Breast MRI    Right breast mass 9:00 upper outer quadrant with contiguous skin involvement by 5.7 cm, right axillary lymph nodes normal in size      04/01/2014 Clinical Stage    Stage IIB: T2 N0      04/10/2014 - 08/26/2014 Neo-Adjuvant Chemotherapy    Dose dense doxorubicin and cyclophosphamide 4 cycles followed by weekly paclitaxel 12. After week 1, paclitaxel changed to nab-paclitaxel       08/28/2014 Breast MRI    Decrease in the size of the right breast mass from 5.7 cm to 8 mm, skin enhancement is no longer seen, decreased in size of axillary lymph nodes, excellent response to chemotherapy      09/29/2014 Definitive Surgery    Right Lumpectomy / SLNB ([facility]): pathologic CR, 2 LN removed and negative for malignancy (0/2 LN)      11/11/2014 - 12/18/2014 Radiation Therapy    Adjuvant RT [facility]): Right breast 50.4 Gy over 28 fractions      02/24/2015 Survivorship    Survivorship care plan completed and  mailed to patient in lieu of in person visit.       CHIEF COMPLIANT: Surveillance of breast cancer  INTERVAL HISTORY: Stephanie Olsen is a 71 year old with above-mentioned history of right breast cancer treated with neoadjuvant chemotherapy and had a pathologic complete response and lumpectomy. She had adjuvant radiation therapy and is currently on surveillance. She had triple negative disease. She reports that her health has been fairly good. She has suffered from recent sinus infections and was treated with antibiotics.  REVIEW OF SYSTEMS:   Constitutional: Denies fevers, chills or abnormal weight loss Eyes: Denies blurriness of vision Ears, nose, mouth, throat, and face: Denies mucositis or sore throat Respiratory: Denies cough, dyspnea or wheezes Cardiovascular: Denies palpitation, chest discomfort Gastrointestinal:  Denies nausea, heartburn or change in bowel habits Skin: Denies abnormal skin rashes Lymphatics: Denies new lymphadenopathy or easy bruising Neurological:Denies numbness, tingling or new weaknesses Behavioral/Psych: Mood is stable, no new changes  Extremities: No lower extremity edema Breast:  denies any pain or lumps or nodules in either breasts All other systems were reviewed with the patient and are negative.  I have reviewed the past medical history, past surgical history, social history and family history with the patient and they are unchanged from previous note.  ALLERGIES:  is allergic to fosamax [alendronate sodium].  MEDICATIONS:  Current Outpatient Prescriptions  Medication Sig Dispense Refill  . Ascorbic Acid (VITAMIN C) 500 MG tablet Take 500 mg by mouth 2 (  two) times daily.     Marland Kitchen aspirin EC 81 MG tablet Take 81 mg by mouth daily.    . Calcium Carb-Cholecalciferol (CALCIUM 600/VITAMIN D3) 600-800 MG-UNIT TABS Take 1 tablet by mouth 2 (two) times daily.     . cetirizine (ZYRTEC) 10 MG tablet Take 10 mg by mouth daily as needed (seasonal allergies).       . clonazePAM (KLONOPIN) 2 MG tablet Take 1 tablet (2 mg total) by mouth 2 (two) times daily as needed for anxiety. 180 tablet 1  . cyclobenzaprine (FLEXERIL) 10 MG tablet Take 1 tablet (10 mg total) by mouth 3 (three) times daily as needed for muscle spasms. For spasms 90 tablet 5  . fish oil-omega-3 fatty acids 1000 MG capsule Take 1 g by mouth at bedtime.     . fluticasone (FLONASE) 50 MCG/ACT nasal spray INSTILL 2 SPRAYS INTO THE NOSE DAILY 48 g 5  . HYDROcodone-homatropine (HYDROMET) 5-1.5 MG/5ML syrup Take 5 mLs by mouth every 4 (four) hours as needed. 240 mL 0  . latanoprost (XALATAN) 0.005 % ophthalmic solution Place 1 drop into both eyes at bedtime.    . Magnesium 500 MG TABS Take 1 tablet by mouth at bedtime. Reported on 05/18/2015    . Multiple Vitamins-Minerals (PRESERVISION AREDS 2 PO) Take 1 capsule by mouth 2 (two) times daily.     . timolol (TIMOPTIC-XR) 0.5 % ophthalmic gel-forming Place 1 drop into both eyes daily.      No current facility-administered medications for this visit.    Facility-Administered Medications Ordered in Other Visits  Medication Dose Route Frequency Provider Last Rate Last Dose  . sodium chloride 0.9 % injection 10 mL  10 mL Intracatheter PRN Nicholas Lose, MD   10 mL at 06/17/14 1635    PHYSICAL EXAMINATION: ECOG PERFORMANCE STATUS: 0 - Asymptomatic  Vitals:   05/09/16 1458  BP: (!) 147/77  Pulse: 72  Resp: 18  Temp: 99 F (37.2 C)   Filed Weights   05/09/16 1458  Weight: 185 lb 4.8 oz (84.1 kg)    GENERAL:alert, no distress and comfortable SKIN: skin color, texture, turgor are normal, no rashes or significant lesions EYES: normal, Conjunctiva are pink and non-injected, sclera clear OROPHARYNX:no exudate, no erythema and lips, buccal mucosa, and tongue normal  NECK: supple, thyroid normal size, non-tender, without nodularity LYMPH:  no palpable lymphadenopathy in the cervical, axillary or inguinal LUNGS: clear to auscultation and  percussion with normal breathing effort HEART: regular rate & rhythm and no murmurs and no lower extremity edema ABDOMEN:abdomen soft, non-tender and normal bowel sounds MUSCULOSKELETAL:no cyanosis of digits and no clubbing  NEURO: alert & oriented x 3 with fluent speech, no focal motor/sensory deficits EXTREMITIES: No lower extremity edema BREAST: No palpable masses or nodules in either right or left breasts. No palpable axillary supraclavicular or infraclavicular adenopathy no breast tenderness or nipple discharge. (exam performed in the presence of a chaperone)  LABORATORY DATA:  I have reviewed the data as listed   Chemistry      Component Value Date/Time   NA 139 05/18/2015 1132   NA 137 08/26/2014 0853   K 5.2 (H) 05/18/2015 1132   K 4.0 08/26/2014 0853   CL 104 05/18/2015 1132   CO2 29 05/18/2015 1132   CO2 19 (L) 08/26/2014 0853   BUN 8 05/18/2015 1132   BUN 9.8 08/26/2014 0853   CREATININE 0.67 05/18/2015 1132   CREATININE 0.7 08/26/2014 0853      Component Value  Date/Time   CALCIUM 9.4 05/18/2015 1132   CALCIUM 9.3 08/26/2014 0853   ALKPHOS 88 05/18/2015 1132   ALKPHOS 69 08/26/2014 0853   AST 17 05/18/2015 1132   AST 16 08/26/2014 0853   ALT 13 05/18/2015 1132   ALT 13 08/26/2014 0853   BILITOT 0.8 05/18/2015 1132   BILITOT 0.38 08/26/2014 0853       Lab Results  Component Value Date   WBC 6.2 05/18/2015   HGB 14.2 05/18/2015   HCT 43.3 05/18/2015   MCV 90.4 05/18/2015   PLT 266.0 05/18/2015   NEUTROABS 3.3 05/18/2015    ASSESSMENT & PLAN:  Breast cancer of upper-outer quadrant of right female breast Right breast invasive ductal carcinoma, palpable mass, 5 cm by mammogram and 5.7 cm by MRI grade 3, ER PR HER-2 negative, associated skin dimpling, T3, N0, M0 stage IIB Ki-67 90%  Chemotherapy summary: Neoadjuvant chemotherapy with dose dense Adriamycin and Cytoxan 04/10/2014. followed by weekly Taxol x1 changed to Abraxane 11 from 06/17/2014 completed  08/26/2014 Rt Lumpectomy: 09/29/14: Path CR 0/2 LN Completed adjuvant radiation therapy 12/18/2014  Breast Cancer Surveillance: 1. Breast exam 05/09/16: right breast upper quadrant feels tight in the muscle. No palpable nodularity. 2. Mammograms 05/14/2015: Benign , breast density category B 3. Bone density 05/14/2015: T score -1.3 osteopenia  Cardiomyopathy:follows with cardiology She continues to stay active and helps take care of her 57 yr old granddaughter as well as her 71 year old aunt.  RTC in 6 months for continued surveillance and after that we can see her once a year  I spent 15 minutes talking to the patient of which more than half was spent in counseling and coordination of care.  No orders of the defined types were placed in this encounter.  The patient has a good understanding of the overall plan. she agrees with it. she will call with any problems that may develop before the next visit here.   Rulon Eisenmenger, MD 05/09/16

## 2016-05-15 ENCOUNTER — Ambulatory Visit
Admission: RE | Admit: 2016-05-15 | Discharge: 2016-05-15 | Disposition: A | Discharge status: Home / Self Care | Payer: Medicare Other | Source: Ambulatory Visit | Attending: Hematology and Oncology | Admitting: Hematology and Oncology

## 2016-05-15 DIAGNOSIS — Z853 Personal history of malignant neoplasm of breast: Secondary | ICD-10-CM

## 2016-05-15 DIAGNOSIS — R922 Inconclusive mammogram: Secondary | ICD-10-CM | POA: Diagnosis not present

## 2016-05-22 ENCOUNTER — Other Ambulatory Visit: Payer: Self-pay | Admitting: Family Medicine

## 2016-05-22 NOTE — Progress Notes (Signed)
Pt called states that she has a sinus infection and requesting for Dr.Gudena to call in antibiotic. Told pt that she will need to call pcp's office to let them know about her sinus infection and that she will need to obtain an antibiotic treatment from them. Pt verbalized understanding.

## 2016-05-22 NOTE — Telephone Encounter (Signed)
Call in Levaquin 500 mg daily for 10 days  

## 2016-05-22 NOTE — Telephone Encounter (Signed)
Pt has been seen 2 X since November for sinus issues. Pt went to the ED with her 69 year old aunt, and now has another sinus infection. Would prefer not to come in and be exposed any more stuff, so hoping Dr Sarajane Jews will call in abx to'  [facility]/ [address]

## 2016-05-23 MED ORDER — LEVOFLOXACIN 500 MG PO TABS: 500.0000 mg | ORAL_TABLET | Freq: Every day | ORAL | 0 refills | Status: AC

## 2016-05-23 NOTE — Telephone Encounter (Signed)
Pt following up on rx request. Pt would like a call back when done.

## 2016-05-23 NOTE — Telephone Encounter (Signed)
Rx sent. Pt notified. Advised to contact office if not improving after abx. Nothing further needed.

## 2016-05-24 ENCOUNTER — Telehealth: Payer: Self-pay | Admitting: Family Medicine

## 2016-05-24 NOTE — Telephone Encounter (Signed)
Pt must have a office visit. 

## 2016-05-24 NOTE — Telephone Encounter (Signed)
° ° °  Pt said her aunt has flu and she is asking for [place] at [facility]

## 2016-05-24 NOTE — Telephone Encounter (Signed)
° ° ° ° °  Spoke with pt she did not want to schedule an appointment

## 2016-06-19 ENCOUNTER — Ambulatory Visit (INDEPENDENT_AMBULATORY_CARE_PROVIDER_SITE_OTHER): Payer: Medicare Other

## 2016-06-19 VITALS — BP 130/80 | HR 74 | Ht 66.0 in | Wt 185.4 lb

## 2016-06-19 DIAGNOSIS — Z7289 Other problems related to lifestyle: Secondary | ICD-10-CM | POA: Diagnosis not present

## 2016-06-19 DIAGNOSIS — Z0000 Encounter for general adult medical examination without abnormal findings: Secondary | ICD-10-CM | POA: Diagnosis not present

## 2016-06-19 NOTE — Patient Instructions (Addendum)
[name] , Thank you for taking time to come for your Medicare Wellness Visit. I appreciate your ongoing commitment to your health goals. Please review the following plan we discussed and let me know if I can assist you in the future.   Educated to check with insurance regarding coverage of Shingles vaccination on Part D or Part B and may have lower co-pay if provided on the Part D side Keep in mind a new shingles vaccine is being released this year;   Medicare now request all "baby boomers" test for possible exposure to Hepatitis C. Many may have been exposed due to dental work, tatoo's, vaccinations when young. The Hepatitis C virus is dormant for many years and then sometimes will cause liver cancer. If you gave blood in the past 15 years, you were most likely checked for Hep C. If you rec'd blood; you may want to consider testing or if you are high risk for any other reason.   PSV 23 is your last pneumonia and it is due    These are the goals we discussed: Goals    . Exercise 150 minutes per week (moderate activity)          Re-engage in exercise as tolerated Enjoy your yoga     . patient          May try to start cooking at home;  Will eat breakfast; fresh baked bread  Eat fresh; buy around the perimeter of the store       This is a list of the screening recommended for you and due dates:  Health Maintenance  Topic Date Due  .  Hepatitis C: One time screening is recommended by Center for Disease Control  (CDC) for  adults born from [birth year] through 1963.   May 18, 1945  . Shingles Vaccine  05/09/2007  . Pneumonia vaccines (2 of 2 - PPSV23) 05/17/2016  . Mammogram  05/15/2018  . Colon Cancer Screening  07/23/2021  . Tetanus Vaccine  10/31/2025  . Flu Shot  Completed  . DEXA scan (bone density measurement)  Completed   Prevention of falls: Remove rugs or any tripping hazards in the home Use Non slip mats in bathtubs and showers Placing grab bars next to the toilet  and or shower Placing handrails on both sides of the stair way Adding extra lighting in the home.   Personal safety issues reviewed:  1. Consider starting a community watch program per [facility] 2.  Changes batteries is smoke detector and/or carbon monoxide detector  3.  If you have firearms; keep them in a safe place 4.  Wear protection when in the sun; Always wear sunscreen or a hat; It is good to have your doctor check your skin annually or review any new areas of concern 5. Driving safety; Keep in the right lane; stay 3 car lengths behind the car in front of you on the highway; look 3 times prior to pulling out; carry your cell phone everywhere you go!    Learn about the Yellow Dot program:  The program allows first responders at your emergency to have access to who your physician is, as well as your medications and medical conditions.  Citizens requesting the Yellow Dot Packages should contact Master Corporal Nunzio Cobbs at the [facility] [phone] for the first week of the program and beginning the week after Easter citizens should contact their Scientist, physiological.       Fall Prevention  in the Home Introduction Falls can cause injuries. They can happen to people of all ages. There are many things you can do to make your home safe and to help prevent falls. What can I do on the outside of my home?  Regularly fix the edges of walkways and driveways and fix any cracks.  Remove anything that might make you trip as you walk through a door, such as a raised step or threshold.  Trim any bushes or trees on the path to your home.  Use bright outdoor lighting.  Clear any walking paths of anything that might make someone trip, such as rocks or tools.  Regularly check to see if handrails are loose or broken. Make sure that both sides of any steps have handrails.  Any raised decks and porches should have guardrails on the edges.  Have  any leaves, snow, or ice cleared regularly.  Use sand or salt on walking paths during winter.  Clean up any spills in your garage right away. This includes oil or grease spills. What can I do in the bathroom?  Use night lights.  Install grab bars by the toilet and in the tub and shower. Do not use towel bars as grab bars.  Use non-skid mats or decals in the tub or shower.  If you need to sit down in the shower, use a plastic, non-slip stool.  Keep the floor dry. Clean up any water that spills on the floor as soon as it happens.  Remove soap buildup in the tub or shower regularly.  Attach bath mats securely with double-sided non-slip rug tape.  Do not have throw rugs and other things on the floor that can make you trip. What can I do in the bedroom?  Use night lights.  Make sure that you have a light by your bed that is easy to reach.  Do not use any sheets or blankets that are too big for your bed. They should not hang down onto the floor.  Have a firm chair that has side arms. You can use this for support while you get dressed.  Do not have throw rugs and other things on the floor that can make you trip. What can I do in the kitchen?  Clean up any spills right away.  Avoid walking on wet floors.  Keep items that you use a lot in easy-to-reach places.  If you need to reach something above you, use a strong step stool that has a grab bar.  Keep electrical cords out of the way.  Do not use floor polish or wax that makes floors slippery. If you must use wax, use non-skid floor wax.  Do not have throw rugs and other things on the floor that can make you trip. What can I do with my stairs?  Do not leave any items on the stairs.  Make sure that there are handrails on both sides of the stairs and use them. Fix handrails that are broken or loose. Make sure that handrails are as long as the stairways.  Check any carpeting to make sure that it is firmly attached to the  stairs. Fix any carpet that is loose or worn.  Avoid having throw rugs at the top or bottom of the stairs. If you do have throw rugs, attach them to the floor with carpet tape.  Make sure that you have a light switch at the top of the stairs and the bottom of the stairs. If you do  not have them, ask someone to add them for you. What else can I do to help prevent falls?  Wear shoes that:  Do not have high heels.  Have rubber bottoms.  Are comfortable and fit you well.  Are closed at the toe. Do not wear sandals.  If you use a stepladder:  Make sure that it is fully opened. Do not climb a closed stepladder.  Make sure that both sides of the stepladder are locked into place.  Ask someone to hold it for you, if possible.  Clearly mark and make sure that you can see:  Any grab bars or handrails.  First and last steps.  Where the edge of each step is.  Use tools that help you move around (mobility aids) if they are needed. These include:  Canes.  Walkers.  Scooters.  Crutches.  Turn on the lights when you go into a dark area. Replace any light bulbs as soon as they burn out.  Set up your furniture so you have a clear path. Avoid moving your furniture around.  If any of your floors are uneven, fix them.  If there are any pets around you, be aware of where they are.  Review your medicines with your doctor. Some medicines can make you feel dizzy. This can increase your chance of falling. Ask your doctor what other things that you can do to help prevent falls. This information is not intended to replace advice given to you by your health care provider. Make sure you discuss any questions you have with your health care provider. Document Released: 02/11/2009 Document Revised: 09/23/2015 Document Reviewed: 05/22/2014  2017 [employer]  Health Maintenance, Female Introduction Adopting a healthy lifestyle and getting preventive care can go a long way to promote health and  wellness. Talk with your health care provider about what schedule of regular examinations is right for you. This is a good chance for you to check in with your provider about disease prevention and staying healthy. In between checkups, there are plenty of things you can do on your own. Experts have done a lot of research about which lifestyle changes and preventive measures are most likely to keep you healthy. Ask your health care provider for more information. Weight and diet Eat a healthy diet  Be sure to include plenty of vegetables, fruits, low-fat dairy products, and lean protein.  Do not eat a lot of foods high in solid fats, added sugars, or salt.  Get regular exercise. This is one of the most important things you can do for your health.  Most adults should exercise for at least 150 minutes each week. The exercise should increase your heart rate and make you sweat (moderate-intensity exercise).  Most adults should also do strengthening exercises at least twice a week. This is in addition to the moderate-intensity exercise. Maintain a healthy weight  Body mass index (BMI) is a measurement that can be used to identify possible weight problems. It estimates body fat based on height and weight. Your health care provider can help determine your BMI and help you achieve or maintain a healthy weight.  For females 58 years of age and older:  A BMI below 18.5 is considered underweight.  A BMI of 18.5 to 24.9 is normal.  A BMI of 25 to 29.9 is considered overweight.  A BMI of 30 and above is considered obese. Watch levels of cholesterol and blood lipids  You should start having your blood tested for lipids and  cholesterol at 69 years of age, then have this test every 5 years.  You may need to have your cholesterol levels checked more often if:  Your lipid or cholesterol levels are high.  You are older than 69 years of age.  You are at high risk for heart disease. Cancer  screening Lung Cancer  Lung cancer screening is recommended for adults 46-27 years old who are at high risk for lung cancer because of a history of smoking.  A yearly low-dose CT scan of the lungs is recommended for people who:  Currently smoke.  Have quit within the past 15 years.  Have at least a 30-pack-year history of smoking. A pack year is smoking an average of one pack of cigarettes a day for 1 year.  Yearly screening should continue until it has been 15 years since you quit.  Yearly screening should stop if you develop a health problem that would prevent you from having lung cancer treatment. Breast Cancer  Practice breast self-awareness. This means understanding how your breasts normally appear and feel.  It also means doing regular breast self-exams. Let your health care provider know about any changes, no matter how small.  If you are in your 20s or 30s, you should have a clinical breast exam (CBE) by a health care provider every 1-3 years as part of a regular health exam.  If you are 17 or older, have a CBE every year. Also consider having a breast X-ray (mammogram) every year.  If you have a family history of breast cancer, talk to your health care provider about genetic screening.  If you are at high risk for breast cancer, talk to your health care provider about having an MRI and a mammogram every year.  Breast cancer gene (BRCA) assessment is recommended for women who have family members with BRCA-related cancers. BRCA-related cancers include:  Breast.  Ovarian.  Tubal.  Peritoneal cancers.  Results of the assessment will determine the need for genetic counseling and BRCA1 and BRCA2 testing. Cervical Cancer  Your health care provider may recommend that you be screened regularly for cancer of the pelvic organs (ovaries, uterus, and vagina). This screening involves a pelvic examination, including checking for microscopic changes to the surface of your cervix  (Pap test). You may be encouraged to have this screening done every 3 years, beginning at age 62.  For women ages 59-65, health care providers may recommend pelvic exams and Pap testing every 3 years, or they may recommend the Pap and pelvic exam, combined with testing for human papilloma virus (HPV), every 5 years. Some types of HPV increase your risk of cervical cancer. Testing for HPV may also be done on women of any age with unclear Pap test results.  Other health care providers may not recommend any screening for nonpregnant women who are considered low risk for pelvic cancer and who do not have symptoms. Ask your health care provider if a screening pelvic exam is right for you.  If you have had past treatment for cervical cancer or a condition that could lead to cancer, you need Pap tests and screening for cancer for at least 20 years after your treatment. If Pap tests have been discontinued, your risk factors (such as having a new sexual partner) need to be reassessed to determine if screening should resume. Some women have medical problems that increase the chance of getting cervical cancer. In these cases, your health care provider may recommend more frequent screening and Pap  tests. Colorectal Cancer  This type of cancer can be detected and often prevented.  Routine colorectal cancer screening usually begins at 69 years of age and continues through 69 years of age.  Your health care provider may recommend screening at an earlier age if you have risk factors for colon cancer.  Your health care provider may also recommend using home test kits to check for hidden blood in the stool.  A small camera at the end of a tube can be used to examine your colon directly (sigmoidoscopy or colonoscopy). This is done to check for the earliest forms of colorectal cancer.  Routine screening usually begins at age 26.  Direct examination of the colon should be repeated every 5-10 years through 69 years  of age. However, you may need to be screened more often if early forms of precancerous polyps or small growths are found. Skin Cancer  Check your skin from head to toe regularly.  Tell your health care provider about any new moles or changes in moles, especially if there is a change in a mole's shape or color.  Also tell your health care provider if you have a mole that is larger than the size of a pencil eraser.  Always use sunscreen. Apply sunscreen liberally and repeatedly throughout the day.  Protect yourself by wearing long sleeves, pants, a wide-brimmed hat, and sunglasses whenever you are outside. Heart disease, diabetes, and high blood pressure  High blood pressure causes heart disease and increases the risk of stroke. High blood pressure is more likely to develop in:  People who have blood pressure in the high end of the normal range (130-139/85-89 mm Hg).  People who are overweight or obese.  People who are African American.  If you are 75-8 years of age, have your blood pressure checked every 3-5 years. If you are 42 years of age or older, have your blood pressure checked every year. You should have your blood pressure measured twice-once when you are at a hospital or clinic, and once when you are not at a hospital or clinic. Record the average of the two measurements. To check your blood pressure when you are not at a hospital or clinic, you can use:  An automated blood pressure machine at a pharmacy.  A home blood pressure monitor.  If you are between 39 years and 33 years old, ask your health care provider if you should take aspirin to prevent strokes.  Have regular diabetes screenings. This involves taking a blood sample to check your fasting blood sugar level.  If you are at a normal weight and have a low risk for diabetes, have this test once every three years after 69 years of age.  If you are overweight and have a high risk for diabetes, consider being tested at  a younger age or more often. Preventing infection Hepatitis B  If you have a higher risk for hepatitis B, you should be screened for this virus. You are considered at high risk for hepatitis B if:  You were born in a country where hepatitis B is common. Ask your health care provider which countries are considered high risk.  Your parents were born in a high-risk country, and you have not been immunized against hepatitis B (hepatitis B vaccine).  You have HIV or AIDS.  You use needles to inject street drugs.  You live with someone who has hepatitis B.  You have had sex with someone who has hepatitis B.  You  get hemodialysis treatment.  You take certain medicines for conditions, including cancer, organ transplantation, and autoimmune conditions. Hepatitis C  Blood testing is recommended for:  Everyone born from [birth year] through 1963.  Anyone with known risk factors for hepatitis C. Sexually transmitted infections (STIs)  You should be screened for sexually transmitted infections (STIs) including gonorrhea and chlamydia if:  You are sexually active and are younger than 69 years of age.  You are older than 69 years of age and your health care provider tells you that you are at risk for this type of infection.  Your sexual activity has changed since you were last screened and you are at an increased risk for chlamydia or gonorrhea. Ask your health care provider if you are at risk.  If you do not have HIV, but are at risk, it may be recommended that you take a prescription medicine daily to prevent HIV infection. This is called pre-exposure prophylaxis (PrEP). You are considered at risk if:  You are sexually active and do not regularly use condoms or know the HIV status of your partner(s).  You take drugs by injection.  You are sexually active with a partner who has HIV. Talk with your health care provider about whether you are at high risk of being infected with HIV. If you choose  to begin PrEP, you should first be tested for HIV. You should then be tested every 3 months for as long as you are taking PrEP. Pregnancy  If you are premenopausal and you may become pregnant, ask your health care provider about preconception counseling.  If you may become pregnant, take 400 to 800 micrograms (mcg) of folic acid every day.  If you want to prevent pregnancy, talk to your health care provider about birth control (contraception). Osteoporosis and menopause  Osteoporosis is a disease in which the bones lose minerals and strength with aging. This can result in serious bone fractures. Your risk for osteoporosis can be identified using a bone density scan.  If you are 101 years of age or older, or if you are at risk for osteoporosis and fractures, ask your health care provider if you should be screened.  Ask your health care provider whether you should take a calcium or vitamin D supplement to lower your risk for osteoporosis.  Menopause may have certain physical symptoms and risks.  Hormone replacement therapy may reduce some of these symptoms and risks. Talk to your health care provider about whether hormone replacement therapy is right for you. Follow these instructions at home:  Schedule regular health, dental, and eye exams.  Stay current with your immunizations.  Do not use any tobacco products including cigarettes, chewing tobacco, or electronic cigarettes.  If you are pregnant, do not drink alcohol.  If you are breastfeeding, limit how much and how often you drink alcohol.  Limit alcohol intake to no more than 1 drink per day for nonpregnant women. One drink equals 12 ounces of beer, 5 ounces of wine, or 1 ounces of hard liquor.  Do not use street drugs.  Do not share needles.  Ask your health care provider for help if you need support or information about quitting drugs.  Tell your health care provider if you often feel depressed.  Tell your health care  provider if you have ever been abused or do not feel safe at home. This information is not intended to replace advice given to you by your health care provider. Make sure you discuss any questions  you have with your health care provider. Document Released: 10/31/2010 Document Revised: 09/23/2015 Document Reviewed: 01/19/2015  2017 [employer]

## 2016-06-19 NOTE — Progress Notes (Addendum)
Subjective:   Stephanie Olsen is a 71 y.o. female who presents for an Initial Medicare Annual Wellness Visit.  The Patient was informed that the wellness visit is to identify future health risk and educate and initiate measures that can reduce risk for increased disease through the lifespan.    NO ROS; Medicare Wellness Visit Last office visit 03/2016 Caregiver; multiple myeloma died; end of 2013/08/28 Now full timer of Aunt who is not doing well 4 children with spouse Had chemo and radiation; no issues now; very slight neuropathy   Patient came in thinking she was not going to get her labs and see Dr. Sarajane Jews this year. I educated regarding the AWV and still needs to make an apt with Dr. Sarajane Jews and did so when leaving.  Feels energy has dissipated;  Admits to the toll placed on her taking care of her spouse and now is POA for aunt.  Dtr has moved here with new infant which adds to her life.  Having some issues sleeping; doing yoga now 2 to 3 times a week  Describes health as good, fair or great? Feels health is pretty good now   Discussed taking care of herself;    Preventive Screening -Counseling & Management   Smoking history- former smoker; quit 8 over 63 yo 10 pack year hx    Smokeless tobacco - no Second Hand Smoke status; No Smokers in the home ETOH a glass of wine on the weekends reported   Medication adherence or issues?  Allergic to fosamax noted with osteopenia   RISK FACTORS Diet Has not cooked as much due to caregiving role Doing more raw vegetables; cucumbers; peppers, tomatoes Fresh spinach; Eggs; boiled Eats out on occasion; but getting back on track Eats out with church group; soup, salad bowl. Commits to doing more cooking    Regular exercise  Has to give credit to yoga for helping her stay calm and deal with the stress;  She go to Yoga classes during chemo; right now she goes 3 to 4 times a week; optimally she likes to go 5; Would originally do  weights and cardio and then yoga. Discussed moving in the exercise slowly as tolerated and rest as he needs too.   Cardiac Risk Factors:  Advanced aged > women over 36 Hyperlipidemia - cho 211; HDL 66; LDL 121; trig 115  Diabetes neg Family History no history  Obesity BMI 30 - now 29.  Educated on normal is 99 to 83   Fall risk  Given education on "Fall Prevention in the Home" for more safety tips the patient can apply as appropriate.  Long term goal is to "age in place" and is undecided at this time   Mobility of Functional changes this year? Just due to energy  Safety infor given via patient summary  Mental Health:  Any emotional problems? Anxious, depressed, irritable, sad or blue? Does not feel she is depressed  Discussed grief and changes in mood or red flags but has a great support system and reaches out to friends  Does have friends who have lost spouses whom she goes off with and other friends at church;  Denies feeling depressed or hopeless; voices pleasure in daily life Counseling with hospice   Eye exam- some damage in right eye and watching left for glaucoma; has regular eye exams   Activities of Daily Living - See functional screen   Cognitive testing; Ad8 score; 0 or less than 2  MMSE deferred  or completed if AD8 + 2 issues  Advanced Directives has planned funeral arrangements with spouse before he died; does not have her on 59. Defer HCPOA ; states she will change her will when she ready   Patient Care Team: Laurey Morale, MD as PCP - General Excell Seltzer, MD as Consulting Physician (General Surgery) Nicholas Lose, MD as Consulting Physician (Hematology and Oncology) Arloa Koh, MD as Consulting Physician (Radiation Oncology) Holley Bouche, NP as Nurse Practitioner (Nurse Practitioner) Sylvan Cheese, NP as Nurse Practitioner (Hematology and Oncology)   Immunization History  Administered Date(s) Administered  . Influenza Whole  02/16/2009, 02/21/2010  . Influenza,inj,Quad PF,36+ Mos 02/25/2013, 02/12/2014  . Influenza-Unspecified 02/28/2015, 03/13/2016  . Pneumococcal Conjugate-13 05/18/2015  . Tdap 02/12/2014, 11/01/2015   Screens Mammogram - hx of right breast cancer 03/2014 - tx 08/2014 Last mammogram 05/2016 Colon cancer and lung cancer in the family  Dexa completed 05/2015  -1.3 Colonoscopy 06/2011; repeat 06/2021   Screening test up to date or reviewed for plan of completion Health Maintenance Due  Topic Date Due  . Hepatitis C Screening  05/17/1945  . ZOSTAVAX  05/09/2007  . PNA vac Low Risk Adult (2 of 2 - PPSV23) 05/17/2016    Patient Care Team: Laurey Morale, MD as PCP - General Excell Seltzer, MD as Consulting Physician (General Surgery) Nicholas Lose, MD as Consulting Physician (Hematology and Oncology) Arloa Koh, MD as Consulting Physician (Radiation Oncology) Holley Bouche, NP as Nurse Practitioner (Nurse Practitioner) Sylvan Cheese, NP as Nurse Practitioner (Hematology and Oncology)        Objective:    Today's Vitals   06/19/16 0904  BP: 130/80  Pulse: 74  SpO2: 97%  Weight: 185 lb 7 oz (84.1 kg)  Height: '5\' 6"'  (1.676 m)   Body mass index is 29.93 kg/m.   Current Medications (verified) Outpatient Encounter Prescriptions as of 06/19/2016  Medication Sig  . Ascorbic Acid (VITAMIN C) 500 MG tablet Take 500 mg by mouth 2 (two) times daily.   Marland Kitchen aspirin EC 81 MG tablet Take 81 mg by mouth daily.  . Calcium Carb-Cholecalciferol (CALCIUM 600/VITAMIN D3) 600-800 MG-UNIT TABS Take 1 tablet by mouth 2 (two) times daily.   . cetirizine (ZYRTEC) 10 MG tablet Take 10 mg by mouth daily as needed (seasonal allergies).   . clonazePAM (KLONOPIN) 2 MG tablet Take 1 tablet (2 mg total) by mouth 2 (two) times daily as needed for anxiety.  . cyclobenzaprine (FLEXERIL) 10 MG tablet Take 1 tablet (10 mg total) by mouth 3 (three) times daily as needed for muscle spasms. For  spasms  . fish oil-omega-3 fatty acids 1000 MG capsule Take 1 g by mouth at bedtime.   . fluticasone (FLONASE) 50 MCG/ACT nasal spray INSTILL 2 SPRAYS INTO THE NOSE DAILY  . latanoprost (XALATAN) 0.005 % ophthalmic solution Place 1 drop into both eyes at bedtime.  . Magnesium 500 MG TABS Take 1 tablet by mouth at bedtime. Reported on 05/18/2015  . Multiple Vitamins-Minerals (PRESERVISION AREDS 2 PO) Take 1 capsule by mouth 2 (two) times daily.   . timolol (TIMOPTIC-XR) 0.5 % ophthalmic gel-forming Place 1 drop into both eyes daily.   Marland Kitchen HYDROcodone-homatropine (HYDROMET) 5-1.5 MG/5ML syrup Take 5 mLs by mouth every 4 (four) hours as needed. (Patient not taking: Reported on 06/19/2016)   No facility-administered encounter medications on file as of 06/19/2016.     Allergies (verified) Fosamax [alendronate sodium]   History: Past Medical History:  Diagnosis Date  . Allergy   . Anxiety   . Breast cancer of upper-outer quadrant of right female breast ([place]) 03/25/2014  . CHF (congestive heart failure) ([place])   . Chicken pox   . Dilated cardiomyopathy ([place])    sees Dr. Haroldine Laws   . Glaucoma    sees Dr. Sarita Haver   . Headache(784.0)   . Hypertension   . Neuromuscular disorder ([place])    peripheral neuropathy from chemo  . Osteopenia   . Osteoporosis    pt states has ostopenia not osteoporosis   . Rosacea   . S/P radiation therapy 11/11/2014 through 12/18/2014     Right breast 5040 cGy in 28 sessions    Past Surgical History:  Procedure Laterality Date  . APPENDECTOMY    . BREAST LUMPECTOMY WITH RADIOACTIVE SEED AND SENTINEL LYMPH NODE BIOPSY Right 09/29/2014   Procedure: RIGHT BREAST LUMPECTOMY WITH RADIOACTIVE SEED AND RIGHT AXILLARY SENTINEL LYMPH NODE BIOPSY;  Surgeon: Excell Seltzer, MD;  Location: [facility];  Service: General;  Laterality: Right;  . cataract surgery on both eyes     12  years ago  . COLONOSCOPY  07-24-11   per Dr. Earlean Shawl, adenomatous polyps,  repeat in 5 yrs  . EYE SURGERY     laser per right eye related to pressure relief; past cataract surgery bilat   . laser of left eye     1 week ago  . PORT-A-CATH REMOVAL Left 09/29/2014   Procedure: REMOVAL PORT-A-CATH;  Surgeon: Excell Seltzer, MD;  Location: [facility];  Service: General;  Laterality: Left;  . PORTACATH PLACEMENT N/A 04/07/2014   Procedure: INSERTION PORT-A-CATH;  Surgeon: Excell Seltzer, MD;  Location: [facility];  Service: General;  Laterality: N/A;   Family History  Problem Relation Age of Onset  . Lung cancer Maternal Uncle     heavy smoker  . Colon cancer Paternal Grandmother 72  . Lung cancer Cousin     smoker  . Leukemia Cousin 3  . Arthritis    . Coronary artery disease    . Hypertension    . Stroke    . Macular degeneration     Social History   Occupational History  . Not on file.   Social History Main Topics  . Smoking status: Former Smoker    Packs/day: 1.00    Years: 10.00    Quit date: 05/02/1975  . Smokeless tobacco: Never Used  . Alcohol use 1.2 oz/week    2 Standard drinks or equivalent per week     Comment: weekends-wine  . Drug use: No  . Sexual activity: Not on file    Tobacco Counseling Counseling given: Yes   Activities of Daily Living No flowsheet data found.  Immunizations and Health Maintenance Immunization History  Administered Date(s) Administered  . Influenza Whole 02/16/2009, 02/21/2010  . Influenza,inj,Quad PF,36+ Mos 02/25/2013, 02/12/2014  . Influenza-Unspecified 02/28/2015, 03/13/2016  . Pneumococcal Conjugate-13 05/18/2015  . Tdap 02/12/2014, 11/01/2015   Health Maintenance Due  Topic Date Due  . Hepatitis C Screening  11/24/1945  . ZOSTAVAX  05/09/2007  . PNA vac Low Risk Adult (2 of 2 - PPSV23) 05/17/2016    Patient Care Team: Laurey Morale, MD as PCP - General Excell Seltzer, MD as Consulting Physician  (General Surgery) Nicholas Lose, MD as Consulting Physician (Hematology and Oncology) Arloa Koh, MD as Consulting Physician (Radiation Oncology) Holley Bouche, NP as Nurse Practitioner (Nurse Practitioner) Sylvan Cheese, NP as Nurse  Practitioner (Hematology and Oncology) Ina Homes in [place] sees q other year    Indicate any recent Medical Services you may have received from other than [facility] providers in the past year (date may be approximate).     Assessment:   This is a routine wellness examination for Stephanie Olsen  Hearing/Vision screen  Hearing Screening   '125Hz'  '250Hz'  '500Hz'  '1000Hz'  '2000Hz'  '3000Hz'  '4000Hz'  '6000Hz'  '8000Hz'   Right ear:       100    Left ear:       100    Vision Screening Comments: Has glaucoma One qtt in am and one in pm Dr. Satira Sark; every 4 months   Dietary issues and exercise activities discussed    Goals    . Exercise 150 minutes per week (moderate activity)          Re-engage in exercise as tolerated Enjoy your yoga     . patient          May try to start cooking at home;  Will eat breakfast; fresh baked bread  Eat fresh; buy around the perimeter of the store      Depression Screen PHQ 2/9 Scores 06/19/2016 01/19/2015 10/22/2014  PHQ - 2 Score 0 0 0    Fall Risk Fall Risk  06/19/2016 01/19/2015 10/22/2014 05/22/2014 05/08/2014  Falls in the past year? Yes No No No No  Number falls in past yr: 1 - - - -        Screening Tests Health Maintenance  Topic Date Due  . Hepatitis C Screening  [birth year]  . ZOSTAVAX  05/09/2007  . PNA vac Low Risk Adult (2 of 2 - PPSV23) 05/17/2016  . MAMMOGRAM  05/15/2018  . COLONOSCOPY  07/23/2021  . TETANUS/TDAP  10/31/2025  . INFLUENZA VACCINE  Completed  . DEXA SCAN  Completed      Plan:    PCP Notes  Health Maintenance Hep c educated and ordered for future labs  Holding PSV 23 today; last pneumonia vaccine but c/o of fatigue and will hold for Dr. Barbie Banner annual review scheduled 3/14.    Stated she has the zoster many years ago due to her spouse getting shingles. Discussed the new vaccine coming out later this year.   Abnormal Screens none  Referrals none   Patient concerns; To note, she is caring for aunt with flu and pneumonia and is not doing well; Spouse expired in 2015 after 9 year battle with cancer.    Nurse Concerns; Denies fatigue but looks tired. Trying to care for Aunt who is in skilled care and not doing well; has a good support system.   Next PCP apt scheduled for March     During the course of the visit, Slyvia was educated and counseled about the following appropriate screening and preventive services:   Vaccines to include Pneumoccal, Influenza, Hepatitis B, Td, Zostavax, HCV  Electrocardiogram  Cardiovascular disease screening  Colorectal cancer screening  Bone density screening  Diabetes screening  Glaucoma screening  Mammography/PAP  Nutrition counseling  Smoking cessation counseling  Patient Instructions (the written plan) were given to the patient.    CVELF,YBOFB, RN   06/19/2016   I have reviewed this note and agree with its contents.  Alysia Penna, MD

## 2016-06-28 ENCOUNTER — Telehealth: Payer: Self-pay

## 2016-06-28 NOTE — Telephone Encounter (Signed)
Left VM that this patient had concerns and had discussed with Dr. Sarajane Jews. Messages were not getting back to [place] or was translated in abbreviated form.  Would suggest she call and ask for triage nurse

## 2016-06-29 HISTORY — PX: COLONOSCOPY: SHX174

## 2016-07-05 NOTE — Telephone Encounter (Signed)
Call and left a message that I fup on her issues. One delay was Wed prior to Thanksgiving and her message did not get cleared before the holiday.  The other was a lack of information transferred and the office has changed our workflows to accommodate better  Communication between the front staff and the nurses.  Left VM to fup if she chooses   [facility]

## 2016-07-12 ENCOUNTER — Encounter: Payer: Self-pay | Admitting: Family Medicine

## 2016-07-12 ENCOUNTER — Ambulatory Visit (INDEPENDENT_AMBULATORY_CARE_PROVIDER_SITE_OTHER): Payer: Medicare Other | Admitting: Family Medicine

## 2016-07-12 VITALS — BP 135/88 | HR 64 | Temp 98.4°F | Ht 66.0 in | Wt 186.0 lb

## 2016-07-12 DIAGNOSIS — I420 Dilated cardiomyopathy: Secondary | ICD-10-CM | POA: Diagnosis not present

## 2016-07-12 DIAGNOSIS — I10 Essential (primary) hypertension: Secondary | ICD-10-CM | POA: Diagnosis not present

## 2016-07-12 DIAGNOSIS — Z7289 Other problems related to lifestyle: Secondary | ICD-10-CM | POA: Diagnosis not present

## 2016-07-12 DIAGNOSIS — F411 Generalized anxiety disorder: Secondary | ICD-10-CM

## 2016-07-12 LAB — HEPATIC FUNCTION PANEL
ALBUMIN: 4.1 g/dL (ref 3.5–5.2)
ALT: 13 U/L (ref 0–35)
AST: 17 U/L (ref 0–37)
Alkaline Phosphatase: 64 U/L (ref 39–117)
Bilirubin, Direct: 0.1 mg/dL (ref 0.0–0.3)
TOTAL PROTEIN: 7 g/dL (ref 6.0–8.3)
Total Bilirubin: 0.7 mg/dL (ref 0.2–1.2)

## 2016-07-12 LAB — POC URINALSYSI DIPSTICK (AUTOMATED)
Bilirubin, UA: NEGATIVE
Glucose, UA: NEGATIVE
Ketones, UA: NEGATIVE
Leukocytes, UA: NEGATIVE
Nitrite, UA: NEGATIVE
PROTEIN UA: NEGATIVE
RBC UA: NEGATIVE
SPEC GRAV UA: 1.02
UROBILINOGEN UA: 0.2
pH, UA: 8.5 — AB

## 2016-07-12 LAB — CBC WITH DIFFERENTIAL/PLATELET
Basophils Absolute: 0 10*3/uL (ref 0.0–0.1)
Basophils Relative: 0.5 % (ref 0.0–3.0)
EOS ABS: 0.2 10*3/uL (ref 0.0–0.7)
Eosinophils Relative: 3.5 % (ref 0.0–5.0)
HCT: 43.1 % (ref 36.0–46.0)
HEMOGLOBIN: 14.6 g/dL (ref 12.0–15.0)
Lymphocytes Relative: 32.7 % (ref 12.0–46.0)
Lymphs Abs: 1.6 10*3/uL (ref 0.7–4.0)
MCHC: 33.8 g/dL (ref 30.0–36.0)
MCV: 90.6 fl (ref 78.0–100.0)
MONO ABS: 0.6 10*3/uL (ref 0.1–1.0)
Monocytes Relative: 11.8 % (ref 3.0–12.0)
Neutro Abs: 2.6 10*3/uL (ref 1.4–7.7)
Neutrophils Relative %: 51.5 % (ref 43.0–77.0)
Platelets: 260 10*3/uL (ref 150.0–400.0)
RBC: 4.76 Mil/uL (ref 3.87–5.11)
RDW: 14.7 % (ref 11.5–15.5)
WBC: 5 10*3/uL (ref 4.0–10.5)

## 2016-07-12 LAB — TSH: TSH: 2.04 u[IU]/mL (ref 0.35–4.50)

## 2016-07-12 LAB — BASIC METABOLIC PANEL
BUN: 13 mg/dL (ref 6–23)
CALCIUM: 10 mg/dL (ref 8.4–10.5)
CO2: 28 meq/L (ref 19–32)
CREATININE: 0.74 mg/dL (ref 0.40–1.20)
Chloride: 103 mEq/L (ref 96–112)
GFR: 82.66 mL/min (ref >60.00)
GLUCOSE: 95 mg/dL (ref 70–99)
Potassium: 4.4 mEq/L (ref 3.5–5.1)
Sodium: 139 mEq/L (ref 135–145)

## 2016-07-12 LAB — LIPID PANEL
Cholesterol: 243 mg/dL — ABNORMAL HIGH (ref 0–200)
HDL: 72.5 mg/dL (ref >39.00)
LDL Cholesterol: 146 mg/dL — ABNORMAL HIGH (ref 0–99)
NONHDL: 170.4
TRIGLYCERIDES: 120 mg/dL (ref 0.0–149.0)
Total CHOL/HDL Ratio: 3
VLDL: 24 mg/dL (ref 0.0–40.0)

## 2016-07-12 MED ORDER — FLUTICASONE PROPIONATE 50 MCG/ACT NA SUSP: NASAL | 5 refills | Status: DC

## 2016-07-12 MED ORDER — CLONAZEPAM 2 MG PO TABS: 2.0000 mg | ORAL_TABLET | Freq: Two times a day (BID) | ORAL | 1 refills | Status: DC | PRN

## 2016-07-12 NOTE — Patient Instructions (Signed)
WE NOW OFFER   [facility] FAST TRACK!!!  SAME DAY Appointments for ACUTE CARE  Such as: Sprains, Injuries, cuts, abrasions, rashes, muscle pain, joint pain, back pain Colds, flu, sore throats, headache, allergies, cough, fever  Ear pain, sinus and eye infections Abdominal pain, nausea, vomiting, diarrhea, upset stomach Animal/insect bites  3 Easy Ways to Schedule: Walk-In Scheduling Call in scheduling Mychart Sign-up: [email or web address]         

## 2016-07-12 NOTE — Progress Notes (Signed)
Pre visit review using our clinic review tool, if applicable. No additional management support is needed unless otherwise documented below in the visit note. 

## 2016-07-12 NOTE — Progress Notes (Signed)
   Subjective:    Patient ID: Stephanie Olsen, female    DOB: 1945-11-10, 71 y.o.   MRN: [id number]  HPI 71 yr old female to follow up with [place]. Her anxiety has been well controlled. Her BP has been well controlled. She has been busy settling the estate of her aunt, who recently died from pneumonia.    Review of Systems  Constitutional: Negative.   HENT: Negative.   Eyes: Negative.   Respiratory: Negative.   Cardiovascular: Negative.   Gastrointestinal: Negative.   Genitourinary: Negative for decreased urine volume, difficulty urinating, dyspareunia, dysuria, enuresis, flank pain, frequency, hematuria, pelvic pain and urgency.  Musculoskeletal: Negative.   Skin: Negative.   Neurological: Negative.   Psychiatric/Behavioral: Negative.        Objective:   Physical Exam  Constitutional: She is oriented to person, place, and time. She appears well-developed and well-nourished. No distress.  HENT:  Head: Normocephalic and atraumatic.  Right Ear: External ear normal.  Left Ear: External ear normal.  Nose: Nose normal.  Mouth/Throat: Oropharynx is clear and moist. No oropharyngeal exudate.  Eyes: Conjunctivae and EOM are normal. Pupils are equal, round, and reactive to light. No scleral icterus.  Neck: Normal range of motion. Neck supple. No JVD present. No thyromegaly present.  Cardiovascular: Normal rate, regular rhythm, normal heart sounds and intact distal pulses.  Exam reveals no gallop and no friction rub.   No murmur heard. Pulmonary/Chest: Effort normal and breath sounds normal. No respiratory distress. She has no wheezes. She has no rales. She exhibits no tenderness.  Abdominal: Soft. Bowel sounds are normal. She exhibits no distension and no mass. There is no tenderness. There is no rebound and no guarding.  Musculoskeletal: Normal range of motion. She exhibits no edema or tenderness.  Lymphadenopathy:    She has no cervical adenopathy.  Neurological: She is alert and  oriented to person, place, and time. She has normal reflexes. No cranial nerve deficit. She exhibits normal muscle tone. Coordination normal.  Skin: Skin is warm and dry. No rash noted. No erythema.  Psychiatric: She has a normal mood and affect. Her behavior is normal. Judgment and thought content normal.          Assessment & Plan:  Her HTN and anxiety are stable. She will get fasting labs today. She is due for another colonoscopy with Dr. Earlean Shawl soon.  Alysia Penna, MD

## 2016-07-13 LAB — HEPATITIS C ANTIBODY: HCV AB: NEGATIVE

## 2016-10-03 ENCOUNTER — Encounter: Payer: Self-pay | Admitting: Family Medicine

## 2016-10-03 DIAGNOSIS — Z800 Family history of malignant neoplasm of digestive organs: Secondary | ICD-10-CM | POA: Diagnosis not present

## 2016-10-03 DIAGNOSIS — K5730 Diverticulosis of large intestine without perforation or abscess without bleeding: Secondary | ICD-10-CM | POA: Diagnosis not present

## 2016-10-03 DIAGNOSIS — K635 Polyp of colon: Secondary | ICD-10-CM | POA: Diagnosis not present

## 2016-10-03 DIAGNOSIS — D122 Benign neoplasm of ascending colon: Secondary | ICD-10-CM | POA: Diagnosis not present

## 2016-10-03 DIAGNOSIS — Z86010 Personal history of colonic polyps: Secondary | ICD-10-CM | POA: Diagnosis not present

## 2016-10-03 DIAGNOSIS — Z1211 Encounter for screening for malignant neoplasm of colon: Secondary | ICD-10-CM | POA: Diagnosis not present

## 2016-10-03 LAB — HM COLONOSCOPY

## 2016-10-11 DIAGNOSIS — H401122 Primary open-angle glaucoma, left eye, moderate stage: Secondary | ICD-10-CM | POA: Diagnosis not present

## 2016-10-11 DIAGNOSIS — H401112 Primary open-angle glaucoma, right eye, moderate stage: Secondary | ICD-10-CM | POA: Diagnosis not present

## 2016-11-06 ENCOUNTER — Telehealth: Payer: Self-pay | Admitting: Hematology and Oncology

## 2016-11-06 ENCOUNTER — Encounter: Payer: Self-pay | Admitting: Hematology and Oncology

## 2016-11-06 ENCOUNTER — Ambulatory Visit (HOSPITAL_BASED_OUTPATIENT_CLINIC_OR_DEPARTMENT_OTHER): Payer: Medicare Other | Admitting: Hematology and Oncology

## 2016-11-06 DIAGNOSIS — M8580 Other specified disorders of bone density and structure, unspecified site: Secondary | ICD-10-CM | POA: Diagnosis not present

## 2016-11-06 DIAGNOSIS — I429 Cardiomyopathy, unspecified: Secondary | ICD-10-CM | POA: Diagnosis not present

## 2016-11-06 DIAGNOSIS — C50411 Malignant neoplasm of upper-outer quadrant of right female breast: Secondary | ICD-10-CM | POA: Diagnosis not present

## 2016-11-06 DIAGNOSIS — Z171 Estrogen receptor negative status [ER-]: Secondary | ICD-10-CM
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
## 2016-11-06 NOTE — Telephone Encounter (Signed)
Scheduled appt per DR. Gudena's Notes - RTC in 1 yr for continued surveillance No [place] in yet message sent to MD.

## 2016-11-06 NOTE — Assessment & Plan Note (Signed)
Right breast invasive ductal carcinoma, palpable mass, 5 cm by mammogram and 5.7 cm by MRI grade 3, ER PR HER-2 negative, associated skin dimpling, T3, N0, M0 stage IIB Ki-67 90%  Chemotherapy summary: Neoadjuvant chemotherapy with dose dense Adriamycin and Cytoxan 04/10/2014. followed by weekly Taxol x1 changed to Abraxane 11 from 06/17/2014 completed 08/26/2014 Rt Lumpectomy: 09/29/14: Path CR 0/2 LN Completed adjuvant radiation therapy 12/18/2014  Breast Cancer Surveillance: 1. Breast exam 11/06/2016: right breast upper quadrant feels tight in the muscle. No palpable nodularity. 2. Mammograms 05/15/2016: Benign , breast density category B 3. Bone density 05/14/2015: T score -1.3 osteopenia  Cardiomyopathy:follows with cardiology She continues to stay active and helps take care of her 69 yr old granddaughter as well as her 59 year old aunt.  RTC in 1 yr for continued surveillance

## 2017-01-09 DIAGNOSIS — R300 Dysuria: Secondary | ICD-10-CM | POA: Diagnosis not present

## 2017-02-16 DIAGNOSIS — H401132 Primary open-angle glaucoma, bilateral, moderate stage: Secondary | ICD-10-CM | POA: Diagnosis not present

## 2017-02-16 DIAGNOSIS — H1033 Unspecified acute conjunctivitis, bilateral: Secondary | ICD-10-CM | POA: Diagnosis not present

## 2017-02-21 DIAGNOSIS — Z23 Encounter for immunization: Secondary | ICD-10-CM | POA: Diagnosis not present

## 2017-04-11 ENCOUNTER — Telehealth: Payer: Self-pay

## 2017-04-11 NOTE — Telephone Encounter (Signed)
Sent to PCP        Copied from [place]. Topic: General - Other >> Apr 10, 2017  4:05 PM Conception Chancy, NT wrote: Reason for CRM: patient states she has been picking up children and crawling under christmas trees and believes to have pulled something in her back. Patient states in the past Dr. Sarajane Jews has given her a muscle relaxer and she would like to know if he can call that in for her. Also would like to know if she could take something during the day for it that does not make her sleepy. Notified patient the office was closed and would reopen 12/12 at 10am. Please contact patient back when you are able too. >> Apr 11, 2017  2:45 PM Boyd Kerbs wrote: Patient called again asking for prescriptions

## 2017-04-12 MED ORDER — METAXALONE 800 MG PO TABS: ORAL_TABLET | ORAL | 2 refills | Status: DC

## 2017-04-12 NOTE — Telephone Encounter (Signed)
Call in Skelaxin 800 mg to take every 6 hours prn muscle spasms, #40 with 2 rf. She may add Ibuprofen to this

## 2017-04-12 NOTE — Telephone Encounter (Signed)
Rx was called in to [facility]. Pt advised and wanted to know if Dr. Sarajane Jews would refill flexeril for her to take a night time?

## 2017-04-13 ENCOUNTER — Telehealth: Payer: Self-pay | Admitting: Family Medicine

## 2017-04-13 ENCOUNTER — Other Ambulatory Visit: Payer: Self-pay | Admitting: Family Medicine

## 2017-04-13 MED ORDER — TIZANIDINE HCL 4 MG PO CAPS: 4.0000 mg | ORAL_CAPSULE | Freq: Four times a day (QID) | ORAL | 2 refills | Status: DC | PRN

## 2017-04-13 MED ORDER — CYCLOBENZAPRINE HCL 10 MG PO TABS: 10.0000 mg | ORAL_TABLET | Freq: Three times a day (TID) | ORAL | 2 refills | Status: DC | PRN

## 2017-04-13 NOTE — Telephone Encounter (Signed)
Sent to PCP pt stated that she would like this addressed today. She has been trying to get this handled since Tuesday.

## 2017-04-13 NOTE — Telephone Encounter (Signed)
Copied from [place]. Topic: General - Other >> Apr 13, 2017  9:54 AM Darl Householder, RMA wrote: Reason for CRM: N[place] from [facility] called to notify Dr. Sarajane Jews that medication Skelaxin 800 mg that was prescribed on 04/12/2017 is not covered by insurance and the insurance is recommending Zanaflex, please call pharmacy to change medication

## 2017-04-13 NOTE — Telephone Encounter (Signed)
Rx was called in pt advised.

## 2017-04-13 NOTE — Telephone Encounter (Signed)
Noted. Cancel the Skelaxin. Call in Zanaflex 4 mg to take every 6 hours prn muscle spasms, #60 with 2 rf

## 2017-04-13 NOTE — Telephone Encounter (Signed)
Pt advised and voiced understanding.   

## 2017-04-13 NOTE — Telephone Encounter (Signed)
Okay. Call in [place] #90 with 2 rf

## 2017-04-16 NOTE — Telephone Encounter (Signed)
Sent to PCP for approval.  

## 2017-04-20 ENCOUNTER — Other Ambulatory Visit: Payer: Self-pay | Admitting: Hematology and Oncology

## 2017-04-20 DIAGNOSIS — Z139 Encounter for screening, unspecified: Secondary | ICD-10-CM

## 2017-05-07 DIAGNOSIS — H353132 Nonexudative age-related macular degeneration, bilateral, intermediate dry stage: Secondary | ICD-10-CM | POA: Diagnosis not present

## 2017-05-07 DIAGNOSIS — H401122 Primary open-angle glaucoma, left eye, moderate stage: Secondary | ICD-10-CM | POA: Diagnosis not present

## 2017-05-07 DIAGNOSIS — H401112 Primary open-angle glaucoma, right eye, moderate stage: Secondary | ICD-10-CM | POA: Diagnosis not present

## 2017-05-07 DIAGNOSIS — H43813 Vitreous degeneration, bilateral: Secondary | ICD-10-CM | POA: Diagnosis not present

## 2017-05-18 ENCOUNTER — Ambulatory Visit
Admission: RE | Admit: 2017-05-18 | Discharge: 2017-05-18 | Disposition: A | Discharge status: Home / Self Care | Payer: Medicare Other | Source: Ambulatory Visit | Attending: Hematology and Oncology | Admitting: Hematology and Oncology

## 2017-05-18 ENCOUNTER — Other Ambulatory Visit: Payer: Self-pay | Admitting: Hematology and Oncology

## 2017-05-18 DIAGNOSIS — Z853 Personal history of malignant neoplasm of breast: Secondary | ICD-10-CM

## 2017-05-18 DIAGNOSIS — Z139 Encounter for screening, unspecified: Secondary | ICD-10-CM

## 2017-05-18 DIAGNOSIS — R922 Inconclusive mammogram: Secondary | ICD-10-CM | POA: Diagnosis not present

## 2017-05-18 HISTORY — DX: Personal history of irradiation: Z92.3

## 2017-05-18 HISTORY — DX: Personal history of antineoplastic chemotherapy: Z92.21

## 2017-07-15 ENCOUNTER — Other Ambulatory Visit: Payer: Self-pay | Admitting: Family Medicine

## 2017-07-16 ENCOUNTER — Other Ambulatory Visit: Payer: Self-pay | Admitting: Family Medicine

## 2017-07-16 NOTE — Telephone Encounter (Signed)
Pt is due for an OV soon for more refills
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
## 2017-09-19 ENCOUNTER — Ambulatory Visit (INDEPENDENT_AMBULATORY_CARE_PROVIDER_SITE_OTHER): Payer: Medicare Other

## 2017-09-19 VITALS — BP 120/70 | HR 63 | Ht 66.3 in | Wt 188.4 lb

## 2017-09-19 DIAGNOSIS — Z0000 Encounter for general adult medical examination without abnormal findings: Secondary | ICD-10-CM | POA: Diagnosis not present

## 2017-09-19 NOTE — Patient Instructions (Addendum)
Stephanie Olsen , Thank you for taking time to come for your Medicare Wellness Visit. I appreciate your ongoing commitment to your health goals. Please review the following plan we discussed and let me know if I can assist you in the future.    The Centers for Disease Control are now recommending 2 pneumonia vaccinations after 70. The first is the Prevnar 13. This helps to boost your immunity to community acquired pneumonia as well as some protection from bacterial pneumonia  The 2nd is the pneumovax 23, which offers more broad protection!  Please consider taking these as this is your best protection against pneumonia.   You will see Dr. Sarajane Jews annually every year unless he dictates otherwise. The AWV is "extra"  Please schedule your apt with him when you leave today for an Office visit and labs. After you see Dr. Sarajane Jews, you can schedule with him and Manuela Schwartz in 2020   Shingrix is a vaccine for the prevention of Shingles in Adults 43 and older.  If you are on Medicare, the shingrix is covered under your Part D plan, so you will take both of the vaccines in the series at your pharmacy. Please check with your benefits regarding applicable copays or out of pocket expenses.  The Shingrix is given in 2 vaccines approx 8 weeks apart. You must receive the 2nd dose prior to 6 months from receipt of the first. Please have the pharmacist print out you Immunization  dates for our office records    These are the goals we discussed: Goals    . Weight (lb) < 200 lb (90.7 kg)     To lose weight around the waist, takes 10 lb weight loss   Check out  online nutrition programs as GumSearch.nl and http://vang.com/; fit61me; Look for foods with "whole" wheat; bran; oatmeal etc Shot at the farmer's markets in season for fresher choices  Watch for "hydrogenated" on the label of oils which are trans-fats.  Watch for "high fructose corn syrup" in snacks, yogurt or ketchup  Meats have less marbling; bright colored  fruits and vegetables;  Canned; dump out liquid and wash vegetables. Be mindful of what we are eating  Portion control is essential to a health weight! Sit down; take a break and enjoy your meal; take smaller bites; put the fork down between bites;  It takes 20 minutes to get full; so check in with your fullness cues and stop eating when you start to fill full              This is a list of the screening recommended for you and due dates:  Health Maintenance  Topic Date Due  . Pneumonia vaccines (2 of 2 - PPSV23) 05/17/2016  . Flu Shot  11/29/2017  . Mammogram  05/19/2019  . Tetanus Vaccine  10/31/2025  . Colon Cancer Screening  10/04/2026  . DEXA scan (bone density measurement)  Completed  .  Hepatitis C: One time screening is recommended by Center for Disease Control  (CDC) for  adults born from [birth year] through 1963.   Completed      Fat and Cholesterol Restricted Diet Getting too much fat and cholesterol in your diet may cause health problems. Following this diet helps keep your fat and cholesterol at normal levels. This can keep you from getting sick. What types of fat should I choose?  Choose monosaturated and polyunsaturated fats. These are found in foods such as olive oil, canola oil, flaxseeds, walnuts, almonds, and seeds.  Eat more omega-3 fats. Good choices include salmon, mackerel, sardines, tuna, flaxseed oil, and ground flaxseeds.  Limit saturated fats. These are in animal products such as meats, butter, and cream. They can also be in plant products such as palm oil, palm kernel oil, and coconut oil.  Avoid foods with partially hydrogenated oils in them. These contain trans fats. Examples of foods that have trans fats are stick margarine, some tub margarines, cookies, crackers, and other baked goods. What general guidelines do I need to follow?  Check food labels. Look for the words "trans fat" and "saturated fat."  When preparing a meal: ? Fill half of your  plate with vegetables and green salads. ? Fill one fourth of your plate with whole grains. Look for the word "whole" as the first word in the ingredient list. ? Fill one fourth of your plate with lean protein foods.  Eat more foods that have fiber, like apples, carrots, beans, peas, and barley.  Eat more home-cooked foods. Eat less at restaurants and buffets.  Limit or avoid alcohol.  Limit foods high in starch and sugar.  Limit fried foods.  Cook foods without frying them. Baking, boiling, grilling, and broiling are all great options.  Lose weight if you are overweight. Losing even a small amount of weight can help your overall health. It can also help prevent diseases such as diabetes and heart disease. What foods can I eat? Grains Whole grains, such as whole wheat or whole grain breads, crackers, cereals, and pasta. Unsweetened oatmeal, bulgur, barley, quinoa, or brown rice. Corn or whole wheat flour tortillas. Vegetables Fresh or frozen vegetables (raw, steamed, roasted, or grilled). Green salads. Fruits All fresh, canned (in natural juice), or frozen fruits. Meat and Other Protein Products Ground beef (85% or leaner), grass-fed beef, or beef trimmed of fat. Skinless chicken or [place]. Ground chicken or [place]. Pork trimmed of fat. All fish and seafood. Eggs. Dried beans, peas, or lentils. Unsalted nuts or seeds. Unsalted canned or dry beans. Dairy Low-fat dairy products, such as skim or 1% milk, 2% or reduced-fat cheeses, low-fat ricotta or cottage cheese, or plain low-fat yogurt. Fats and Oils Tub margarines without trans fats. Light or reduced-fat mayonnaise and salad dressings. Avocado. Olive, canola, sesame, or safflower oils. Natural peanut or almond butter (choose ones without added sugar and oil). The items listed above may not be a complete list of recommended foods or beverages. Contact your dietitian for more options. What foods are not recommended? Grains White bread.  White pasta. White rice. Cornbread. Bagels, pastries, and croissants. Crackers that contain trans fat. Vegetables White potatoes. Corn. Creamed or fried vegetables. Vegetables in a cheese sauce. Fruits Dried fruits. Canned fruit in light or heavy syrup. Fruit juice. Meat and Other Protein Products Fatty cuts of meat. Ribs, chicken wings, bacon, sausage, bologna, salami, chitterlings, fatback, hot dogs, bratwurst, and packaged luncheon meats. Liver and organ meats. Dairy Whole or 2% milk, cream, half-and-half, and cream cheese. Whole milk cheeses. Whole-fat or sweetened yogurt. Full-fat cheeses. Nondairy creamers and whipped toppings. Processed cheese, cheese spreads, or cheese curds. Sweets and Desserts Corn syrup, sugars, honey, and molasses. Candy. Jam and jelly. Syrup. Sweetened cereals. Cookies, pies, cakes, donuts, muffins, and ice cream. Fats and Oils Butter, stick margarine, lard, shortening, ghee, or bacon fat. Coconut, palm kernel, or palm oils. Beverages Alcohol. Sweetened drinks (such as sodas, lemonade, and fruit drinks or punches). The items listed above may not be a complete list of foods and beverages to  avoid. Contact your dietitian for more information. This information is not intended to replace advice given to you by your health care provider. Make sure you discuss any questions you have with your health care provider. Document Released: 10/17/2011 Document Revised: 12/23/2015 Document Reviewed: 07/17/2013 Elsevier Interactive Patient Education  2018 [place] in the Home Falls can cause injuries. They can happen to people of all ages. There are many things you can do to make your home safe and to help prevent falls. What can I do on the outside of my home?  Regularly fix the edges of walkways and driveways and fix any cracks.  Remove anything that might make you trip as you walk through a door, such as a raised step or threshold.  Trim any  bushes or trees on the path to your home.  Use bright outdoor lighting.  Clear any walking paths of anything that might make someone trip, such as rocks or tools.  Regularly check to see if handrails are loose or broken. Make sure that both sides of any steps have handrails.  Any raised decks and porches should have guardrails on the edges.  Have any leaves, snow, or ice cleared regularly.  Use sand or salt on walking paths during winter.  Clean up any spills in your garage right away. This includes oil or grease spills. What can I do in the bathroom?  Use night lights.  Install grab bars by the toilet and in the tub and shower. Do not use towel bars as grab bars.  Use non-skid mats or decals in the tub or shower.  If you need to sit down in the shower, use a plastic, non-slip stool.  Keep the floor dry. Clean up any water that spills on the floor as soon as it happens.  Remove soap buildup in the tub or shower regularly.  Attach bath mats securely with double-sided non-slip rug tape.  Do not have throw rugs and other things on the floor that can make you trip. What can I do in the bedroom?  Use night lights.  Make sure that you have a light by your bed that is easy to reach.  Do not use any sheets or blankets that are too big for your bed. They should not hang down onto the floor.  Have a firm chair that has side arms. You can use this for support while you get dressed.  Do not have throw rugs and other things on the floor that can make you trip. What can I do in the kitchen?  Clean up any spills right away.  Avoid walking on wet floors.  Keep items that you use a lot in easy-to-reach places.  If you need to reach something above you, use a strong step stool that has a grab bar.  Keep electrical cords out of the way.  Do not use floor polish or wax that makes floors slippery. If you must use wax, use non-skid floor wax.  Do not have throw rugs and other  things on the floor that can make you trip. What can I do with my stairs?  Do not leave any items on the stairs.  Make sure that there are handrails on both sides of the stairs and use them. Fix handrails that are broken or loose. Make sure that handrails are as long as the stairways.  Check any carpeting to make sure that it is firmly attached to the stairs. Fix any carpet  that is loose or worn.  Avoid having throw rugs at the top or bottom of the stairs. If you do have throw rugs, attach them to the floor with carpet tape.  Make sure that you have a light switch at the top of the stairs and the bottom of the stairs. If you do not have them, ask someone to add them for you. What else can I do to help prevent falls?  Wear shoes that: ? Do not have high heels. ? Have rubber bottoms. ? Are comfortable and fit you well. ? Are closed at the toe. Do not wear sandals.  If you use a stepladder: ? Make sure that it is fully opened. Do not climb a closed stepladder. ? Make sure that both sides of the stepladder are locked into place. ? Ask someone to hold it for you, if possible.  Clearly mark and make sure that you can see: ? Any grab bars or handrails. ? First and last steps. ? Where the edge of each step is.  Use tools that help you move around (mobility aids) if they are needed. These include: ? Canes. ? Walkers. ? Scooters. ? Crutches.  Turn on the lights when you go into a dark area. Replace any light bulbs as soon as they burn out.  Set up your furniture so you have a clear path. Avoid moving your furniture around.  If any of your floors are uneven, fix them.  If there are any pets around you, be aware of where they are.  Review your medicines with your doctor. Some medicines can make you feel dizzy. This can increase your chance of falling. Ask your doctor what other things that you can do to help prevent falls. This information is not intended to replace advice given to  you by your health care provider. Make sure you discuss any questions you have with your health care provider. Document Released: 02/11/2009 Document Revised: 09/23/2015 Document Reviewed: 05/22/2014 Elsevier Interactive Patient Education  2018 [place] A mammogram is an X-ray of the breasts that is done to check for abnormal changes. This procedure can screen for and detect any changes that may suggest breast cancer. A mammogram can also identify other changes and variations in the breast, such as:  Inflammation of the breast tissue (mastitis).  An infected area that contains a collection of pus (abscess).  A fluid-filled sac (cyst).  Fibrocystic changes. This is when breast tissue becomes denser, which can make the tissue feel rope-like or uneven under the skin.  Tumors that are not cancerous (benign).  Tell a health care provider about:  Any allergies you have.  If you have breast implants.  If you have had previous breast disease, biopsy, or surgery.  If you are breastfeeding.  Any possibility that you could be pregnant, if this applies.  If you are younger than age 105.  If you have a family history of breast cancer. What are the risks? Generally, this is a safe procedure. However, problems may occur, including:  Exposure to radiation. Radiation levels are very low with this test.  The results being misinterpreted.  The need for further tests.  The inability of the mammogram to detect certain cancers.  What happens before the procedure?  Schedule your test about 1-2 weeks after your menstrual period. This is usually when your breasts are the least tender.  If you have had a mammogram done at a different facility in the past, get the  mammogram X-rays or have them sent to your current exam facility in order to compare them.  Wash your breasts and under your arms the day of the test.  Do not wear deodorants, perfumes, lotions, or powders anywhere  on your body on the day of the test.  Remove any jewelry from your neck.  Wear clothes that you can change into and out of easily. What happens during the procedure?  You will undress from the waist up and put on a gown.  You will stand in front of the X-ray machine.  Each breast will be placed between two plastic or glass plates. The plates will compress your breast for a few seconds. Try to stay as relaxed as possible during the procedure. This does not cause any harm to your breasts and any discomfort you feel will be very brief.  X-rays will be taken from different angles of each breast. The procedure may vary among health care providers and hospitals. What happens after the procedure?  The mammogram will be examined by a specialist (radiologist).  You may need to repeat certain parts of the test, depending on the quality of the images. This is commonly done if the radiologist needs a better view of the breast tissue.  Ask when your test results will be ready. Make sure you get your test results.  You may resume your normal activities. This information is not intended to replace advice given to you by your health care provider. Make sure you discuss any questions you have with your health care provider. Document Released: 04/14/2000 Document Revised: 09/20/2015 Document Reviewed: 06/26/2014 Elsevier Interactive Patient Education  [employer].

## 2017-09-21 ENCOUNTER — Ambulatory Visit (INDEPENDENT_AMBULATORY_CARE_PROVIDER_SITE_OTHER): Payer: Medicare Other | Admitting: Family Medicine

## 2017-09-21 ENCOUNTER — Encounter: Payer: Self-pay | Admitting: Family Medicine

## 2017-09-21 ENCOUNTER — Other Ambulatory Visit: Payer: Medicare Other

## 2017-09-21 VITALS — BP 118/62 | HR 65 | Temp 98.5°F | Ht 66.25 in | Wt 189.4 lb

## 2017-09-21 DIAGNOSIS — R42 Dizziness and giddiness: Secondary | ICD-10-CM | POA: Diagnosis not present

## 2017-09-21 DIAGNOSIS — I10 Essential (primary) hypertension: Secondary | ICD-10-CM

## 2017-09-21 NOTE — Progress Notes (Signed)
   Subjective:    Patient ID: Stephanie Olsen, female    DOB: 08/23/1945, 72 y.o.   MRN: [id number]  HPI Here for 2 spells of dizziness in the past 2 weeks. The first occurred when she stood up from sitting in a chair, and the other occurred during a yoga class when she stood up. Each time lasted about 5 minutes and then resolved. She felt like the room was spinning. No headache or other neurologic deficits. She takes Zyrtec and Flonase every day, but she has still had a lot of sinus congestion this spring.    Review of Systems  Constitutional: Negative.   HENT: Positive for congestion and sinus pressure. Negative for ear pain, postnasal drip, sinus pain and sore throat.   Eyes: Negative.   Respiratory: Negative.   Cardiovascular: Negative.   Neurological: Positive for dizziness. Negative for tremors, seizures, syncope, facial asymmetry, speech difficulty, light-headedness, numbness and headaches.       Objective:   Physical Exam  Constitutional: She appears well-developed and well-nourished.  Neck: No thyromegaly present.  Cardiovascular: Normal rate, regular rhythm, normal heart sounds and intact distal pulses.  Pulmonary/Chest: Effort normal and breath sounds normal.          Assessment & Plan:  Vertigo. I encouraged her to drink fluids. Add Mucinex bid. We will follow up next week at her well exam.  Alysia Penna, MD

## 2017-09-25 ENCOUNTER — Other Ambulatory Visit (INDEPENDENT_AMBULATORY_CARE_PROVIDER_SITE_OTHER): Payer: Medicare Other

## 2017-09-25 DIAGNOSIS — I10 Essential (primary) hypertension: Secondary | ICD-10-CM | POA: Diagnosis not present

## 2017-09-25 LAB — BASIC METABOLIC PANEL
BUN: 12 mg/dL (ref 6–23)
CALCIUM: 9.5 mg/dL (ref 8.4–10.5)
CO2: 27 meq/L (ref 19–32)
Chloride: 101 mEq/L (ref 96–112)
Creatinine, Ser: 0.64 mg/dL (ref 0.40–1.20)
GFR: 97.4 mL/min (ref >60.00)
Glucose, Bld: 97 mg/dL (ref 70–99)
Potassium: 3.9 mEq/L (ref 3.5–5.1)
SODIUM: 136 meq/L (ref 135–145)

## 2017-09-25 LAB — POC URINALSYSI DIPSTICK (AUTOMATED)
Bilirubin, UA: NEGATIVE
Blood, UA: NEGATIVE
GLUCOSE UA: NEGATIVE
Ketones, UA: NEGATIVE
Nitrite, UA: NEGATIVE
PROTEIN UA: NEGATIVE
SPEC GRAV UA: 1.02 (ref 1.010–1.025)
UROBILINOGEN UA: 0.2 U/dL
pH, UA: 6 (ref 5.0–8.0)

## 2017-09-25 LAB — LIPID PANEL
CHOL/HDL RATIO: 3
CHOLESTEROL: 192 mg/dL (ref 0–200)
HDL: 71.6 mg/dL (ref >39.00)
LDL Cholesterol: 104 mg/dL — ABNORMAL HIGH (ref 0–99)
NonHDL: 120.67
TRIGLYCERIDES: 81 mg/dL (ref 0.0–149.0)
VLDL: 16.2 mg/dL (ref 0.0–40.0)

## 2017-09-25 LAB — TSH: TSH: 2.97 u[IU]/mL (ref 0.35–4.50)

## 2017-09-25 LAB — CBC WITH DIFFERENTIAL/PLATELET
BASOS PCT: 0.5 % (ref 0.0–3.0)
Basophils Absolute: 0 10*3/uL (ref 0.0–0.1)
EOS ABS: 0.2 10*3/uL (ref 0.0–0.7)
Eosinophils Relative: 3.9 % (ref 0.0–5.0)
HCT: 41 % (ref 36.0–46.0)
Hemoglobin: 14.1 g/dL (ref 12.0–15.0)
Lymphocytes Relative: 37.8 % (ref 12.0–46.0)
Lymphs Abs: 1.7 10*3/uL (ref 0.7–4.0)
MCHC: 34.5 g/dL (ref 30.0–36.0)
MCV: 88.2 fl (ref 78.0–100.0)
MONO ABS: 0.5 10*3/uL (ref 0.1–1.0)
Monocytes Relative: 12 % (ref 3.0–12.0)
NEUTROS ABS: 2 10*3/uL (ref 1.4–7.7)
Neutrophils Relative %: 45.8 % (ref 43.0–77.0)
Platelets: 233 10*3/uL (ref 150.0–400.0)
RBC: 4.65 Mil/uL (ref 3.87–5.11)
RDW: 13.6 % (ref 11.5–15.5)
WBC: 4.4 10*3/uL (ref 4.0–10.5)

## 2017-09-25 LAB — HEPATIC FUNCTION PANEL
ALBUMIN: 4 g/dL (ref 3.5–5.2)
ALT: 17 U/L (ref 0–35)
AST: 19 U/L (ref 0–37)
Alkaline Phosphatase: 55 U/L (ref 39–117)
Bilirubin, Direct: 0.1 mg/dL (ref 0.0–0.3)
TOTAL PROTEIN: 6.9 g/dL (ref 6.0–8.3)
Total Bilirubin: 0.6 mg/dL (ref 0.2–1.2)

## 2017-09-26 ENCOUNTER — Ambulatory Visit (INDEPENDENT_AMBULATORY_CARE_PROVIDER_SITE_OTHER): Payer: Medicare Other | Admitting: Family Medicine

## 2017-09-26 ENCOUNTER — Encounter: Payer: Self-pay | Admitting: Family Medicine

## 2017-09-26 VITALS — BP 114/82 | HR 74 | Temp 97.9°F | Ht 65.25 in | Wt 188.8 lb

## 2017-09-26 DIAGNOSIS — Z171 Estrogen receptor negative status [ER-]: Secondary | ICD-10-CM | POA: Diagnosis not present

## 2017-09-26 DIAGNOSIS — I420 Dilated cardiomyopathy: Secondary | ICD-10-CM

## 2017-09-26 DIAGNOSIS — C50411 Malignant neoplasm of upper-outer quadrant of right female breast: Secondary | ICD-10-CM | POA: Diagnosis not present

## 2017-09-26 DIAGNOSIS — I10 Essential (primary) hypertension: Secondary | ICD-10-CM

## 2017-09-26 DIAGNOSIS — E785 Hyperlipidemia, unspecified: Secondary | ICD-10-CM

## 2017-09-26 DIAGNOSIS — F411 Generalized anxiety disorder: Secondary | ICD-10-CM

## 2017-09-26 NOTE — Progress Notes (Signed)
   Subjective:    Patient ID: Stephanie Olsen, female    DOB: July 18, 1945, 72 y.o.   MRN: [id number]  HPI Here to follow up on issues. She feels good. Her HTN is stable. Her anxiety is controlled. She sees her Oncologist and GYN regularly.    Review of Systems  Constitutional: Negative.   HENT: Negative.   Eyes: Negative.   Respiratory: Negative.   Cardiovascular: Negative.   Gastrointestinal: Negative.   Genitourinary: Negative for decreased urine volume, difficulty urinating, dyspareunia, dysuria, enuresis, flank pain, frequency, hematuria, pelvic pain and urgency.  Musculoskeletal: Negative.   Skin: Negative.   Neurological: Negative.   Psychiatric/Behavioral: Negative.        Objective:   Physical Exam  Constitutional: She is oriented to person, place, and time. She appears well-developed and well-nourished. No distress.  HENT:  Head: Normocephalic and atraumatic.  Right Ear: External ear normal.  Left Ear: External ear normal.  Nose: Nose normal.  Mouth/Throat: Oropharynx is clear and moist. No oropharyngeal exudate.  Eyes: Pupils are equal, round, and reactive to light. Conjunctivae and EOM are normal. No scleral icterus.  Neck: Normal range of motion. Neck supple. No JVD present. No thyromegaly present.  Cardiovascular: Normal rate, regular rhythm, normal heart sounds and intact distal pulses. Exam reveals no gallop and no friction rub.  No murmur heard. Pulmonary/Chest: Effort normal and breath sounds normal. No respiratory distress. She has no wheezes. She has no rales. She exhibits no tenderness.  Abdominal: Soft. Bowel sounds are normal. She exhibits no distension and no mass. There is no tenderness. There is no rebound and no guarding.  Musculoskeletal: Normal range of motion. She exhibits no edema or tenderness.  Lymphadenopathy:    She has no cervical adenopathy.  Neurological: She is alert and oriented to person, place, and time. She has normal reflexes. She  displays normal reflexes. No cranial nerve deficit. She exhibits normal muscle tone. Coordination normal.  Skin: Skin is warm and dry. No rash noted. No erythema.  Psychiatric: She has a normal mood and affect. Her behavior is normal. Judgment and thought content normal.          Assessment & Plan:  Her HTN is stable. Her cardiomyopathy is stable. Her cholesterol looks great, and she has dropped her LDL 42 points by changing her diet. She also takes fish oil and garlic.  Alysia Penna, MD

## 2017-11-06 ENCOUNTER — Inpatient Hospital Stay: Payer: Medicare Other | Attending: Hematology and Oncology | Admitting: Hematology and Oncology

## 2017-11-06 ENCOUNTER — Telehealth: Payer: Self-pay | Admitting: Hematology and Oncology

## 2017-11-06 VITALS — BP 130/85 | HR 69 | Temp 98.1°F | Resp 18 | Ht 65.25 in | Wt 189.7 lb

## 2017-11-06 DIAGNOSIS — M8580 Other specified disorders of bone density and structure, unspecified site: Secondary | ICD-10-CM | POA: Diagnosis not present

## 2017-11-06 DIAGNOSIS — Z79899 Other long term (current) drug therapy: Secondary | ICD-10-CM | POA: Diagnosis not present

## 2017-11-06 DIAGNOSIS — Z171 Estrogen receptor negative status [ER-]: Secondary | ICD-10-CM | POA: Diagnosis not present

## 2017-11-06 DIAGNOSIS — Z7982 Long term (current) use of aspirin: Secondary | ICD-10-CM | POA: Diagnosis not present

## 2017-11-06 DIAGNOSIS — C50411 Malignant neoplasm of upper-outer quadrant of right female breast: Secondary | ICD-10-CM | POA: Insufficient documentation

## 2017-11-06 DIAGNOSIS — Z9221 Personal history of antineoplastic chemotherapy: Secondary | ICD-10-CM | POA: Diagnosis not present

## 2017-11-06 DIAGNOSIS — Z923 Personal history of irradiation: Secondary | ICD-10-CM | POA: Diagnosis not present

## 2017-11-06 DIAGNOSIS — Z780 Asymptomatic menopausal state: Secondary | ICD-10-CM | POA: Diagnosis not present

## 2017-11-06 DIAGNOSIS — I429 Cardiomyopathy, unspecified: Secondary | ICD-10-CM | POA: Insufficient documentation

## 2017-11-06 NOTE — Assessment & Plan Note (Signed)
Right breast invasive ductal carcinoma, palpable mass, 5 cm by mammogram and 5.7 cm by MRI grade 3, ER PR HER-2 negative, associated skin dimpling, T3, N0, M0 stage IIB Ki-67 90%  Chemotherapy summary: Neoadjuvant chemotherapy with dose dense Adriamycin and Cytoxan 04/10/2014. followed by weekly Taxol x1 changed to Abraxane 11 from 06/17/2014 completed 08/26/2014 Rt Lumpectomy: 09/29/14: Path CR 0/2 LN Completed adjuvant radiation therapy 12/18/2014  Breast Cancer Surveillance: 1. Breast exam 11/06/2017: right breast upper quadrant feels tight in the muscle. No palpable nodularity. 2. Mammograms 05/15/2017: Benign , breast density category B 3. Bone density 05/14/2015: T score -1.3 osteopenia  Cardiomyopathy:follows with cardiology She continues to stay active and helps take care of her 70 yr old granddaughter as well as her 68 year old aunt.  RTC in 1 yr for continued surveillance

## 2017-11-06 NOTE — Progress Notes (Signed)
Patient Care Team: Laurey Morale, MD as PCP - Haskell Riling, MD as Consulting Physician (General Surgery) Nicholas Lose, MD as Consulting Physician (Hematology and Oncology) Arloa Koh, MD as Consulting Physician (Radiation Oncology) Holley Bouche, NP as Nurse Practitioner (Nurse Practitioner) Sylvan Cheese, NP as Nurse Practitioner (Hematology and Oncology)  DIAGNOSIS:  Encounter Diagnosis  Name Primary?  . Malignant neoplasm of upper-outer quadrant of right breast in female, estrogen receptor negative ([place])     SUMMARY OF ONCOLOGIC HISTORY:   Breast cancer of upper-outer quadrant of right female breast ([place])   03/18/2014 Mammogram    Right breast: suspicious mass 10:00 position measuring 4.9 x 3 x 5 cm, 2 lower right axillary lymph nodes mildly thickened cortices      03/23/2014 Initial Biopsy    Right breast needle biopsy 9:00: Invasive ductal carcinoma grade 3, ER- (0%), PR- (0%), HER-2 negative (ratio 1.3), Ki67 95%      03/31/2014 Breast MRI    Right breast mass 9:00 upper outer quadrant with contiguous skin involvement by 5.7 cm, right axillary lymph nodes normal in size      04/01/2014 Clinical Stage    Stage IIB: T2 N0      04/10/2014 - 08/26/2014 Neo-Adjuvant Chemotherapy    Dose dense doxorubicin and cyclophosphamide 4 cycles followed by weekly paclitaxel 12. After week 1, paclitaxel changed to nab-paclitaxel       08/28/2014 Breast MRI    Decrease in the size of the right breast mass from 5.7 cm to 8 mm, skin enhancement is no longer seen, decreased in size of axillary lymph nodes, excellent response to chemotherapy      09/29/2014 Definitive Surgery    Right Lumpectomy / SLNB ([facility]): pathologic CR, 2 LN removed and negative for malignancy (0/2 LN)      11/11/2014 - 12/18/2014 Radiation Therapy    Adjuvant RT [facility]): Right breast 50.4 Gy over 28 fractions      02/24/2015 Survivorship    Survivorship care plan  completed and mailed to patient in lieu of in person visit.       CHIEF COMPLIANT: Follow-up of history of breast cancer  INTERVAL HISTORY: Stephanie Olsen is a 29 year old with above-mentioned history of right breast cancer treated with neoadjuvant chemotherapy followed by lumpectomy radiation is currently in surveillance.  She reports no major problems or concerns.  Denies any lumps or nodules in the breast.  REVIEW OF SYSTEMS:   Constitutional: Denies fevers, chills or abnormal weight loss Eyes: Denies blurriness of vision Ears, nose, mouth, throat, and face: Denies mucositis or sore throat Respiratory: Denies cough, dyspnea or wheezes Cardiovascular: Denies palpitation, chest discomfort Gastrointestinal:  Denies nausea, heartburn or change in bowel habits Skin: Denies abnormal skin rashes Lymphatics: Denies new lymphadenopathy or easy bruising Neurological:Denies numbness, tingling or new weaknesses Behavioral/Psych: Mood is stable, no new changes  Extremities: No lower extremity edema Breast:  denies any pain or lumps or nodules in either breasts All other systems were reviewed with the patient and are negative.  I have reviewed the past medical history, past surgical history, social history and family history with the patient and they are unchanged from previous note.  ALLERGIES:  is allergic to fosamax [alendronate sodium].  MEDICATIONS:  Current Outpatient Medications  Medication Sig Dispense Refill  . Ascorbic Acid (VITAMIN C) 500 MG tablet Take 500 mg by mouth 2 (two) times daily.     Marland Kitchen aspirin EC 81 MG tablet Take 81 mg by  mouth daily.    . Calcium Carb-Cholecalciferol (CALCIUM 600/VITAMIN D3) 600-800 MG-UNIT TABS Take 1 tablet by mouth 2 (two) times daily.     . cetirizine (ZYRTEC) 10 MG tablet Take 10 mg by mouth daily as needed (seasonal allergies).     . clonazePAM (KLONOPIN) 2 MG tablet Take 1 tablet (2 mg total) by mouth 2 (two) times daily as needed for anxiety.  180 tablet 1  . cyclobenzaprine (FLEXERIL) 10 MG tablet Take 1 tablet (10 mg total) by mouth 3 (three) times daily as needed for muscle spasms. For spasms 90 tablet 2  . fish oil-omega-3 fatty acids 1000 MG capsule Take 1 g by mouth at bedtime.     . fluticasone (FLONASE) 50 MCG/ACT nasal spray SHAKE LIQUID AND USE 2 SPRAYS IN EACH NOSTRIL EVERY DAY 48 g 0  . latanoprost (XALATAN) 0.005 % ophthalmic solution Place 1 drop into both eyes at bedtime.    . Multiple Vitamins-Minerals (PRESERVISION AREDS 2 PO) Take 1 capsule by mouth 2 (two) times daily.     . timolol (TIMOPTIC-XR) 0.5 % ophthalmic gel-forming Place 1 drop into both eyes daily.      No current facility-administered medications for this visit.     PHYSICAL EXAMINATION: ECOG PERFORMANCE STATUS: 1 - Symptomatic but completely ambulatory  Vitals:   11/06/17 1402  BP: 130/85  Pulse: 69  Resp: 18  Temp: 98.1 F (36.7 C)  SpO2: 98%   Filed Weights   11/06/17 1402  Weight: 189 lb 11.2 oz (86 kg)    GENERAL:alert, no distress and comfortable SKIN: skin color, texture, turgor are normal, no rashes or significant lesions EYES: normal, Conjunctiva are pink and non-injected, sclera clear OROPHARYNX:no exudate, no erythema and lips, buccal mucosa, and tongue normal  NECK: supple, thyroid normal size, non-tender, without nodularity LYMPH:  no palpable lymphadenopathy in the cervical, axillary or inguinal LUNGS: clear to auscultation and percussion with normal breathing effort HEART: regular rate & rhythm and no murmurs and no lower extremity edema ABDOMEN:abdomen soft, non-tender and normal bowel sounds MUSCULOSKELETAL:no cyanosis of digits and no clubbing  NEURO: alert & oriented x 3 with fluent speech, no focal motor/sensory deficits EXTREMITIES: No lower extremity edema BREAST: No palpable masses or nodules in either right or left breasts. No palpable axillary supraclavicular or infraclavicular adenopathy no breast tenderness  or nipple discharge. (exam performed in the presence of a chaperone)  LABORATORY DATA:  I have reviewed the data as listed CMP Latest Ref Rng & Units 09/25/2017 07/12/2016 05/18/2015  Glucose 70 - 99 mg/dL 97 95 98  BUN 6 - 23 mg/dL '12 13 8  ' Creatinine 0.40 - 1.20 mg/dL 0.64 0.74 0.67  Sodium 135 - 145 mEq/L 136 139 139  Potassium 3.5 - 5.1 mEq/L 3.9 4.4 5.2(H)  Chloride 96 - 112 mEq/L 101 103 104  CO2 19 - 32 mEq/L '27 28 29  ' Calcium 8.4 - 10.5 mg/dL 9.5 10.0 9.4  Total Protein 6.0 - 8.3 g/dL 6.9 7.0 6.8  Total Bilirubin 0.2 - 1.2 mg/dL 0.6 0.7 0.8  Alkaline Phos 39 - 117 U/L 55 64 88  AST 0 - 37 U/L '19 17 17  ' ALT 0 - 35 U/L '17 13 13    ' Lab Results  Component Value Date   WBC 4.4 09/25/2017   HGB 14.1 09/25/2017   HCT 41.0 09/25/2017   MCV 88.2 09/25/2017   PLT 233.0 09/25/2017   NEUTROABS 2.0 09/25/2017    ASSESSMENT & PLAN:  Breast  cancer of upper-outer quadrant of right female breast Right breast invasive ductal carcinoma, palpable mass, 5 cm by mammogram and 5.7 cm by MRI grade 3, ER PR HER-2 negative, associated skin dimpling, T3, N0, M0 stage IIB Ki-67 90%  Chemotherapy summary: Neoadjuvant chemotherapy with dose dense Adriamycin and Cytoxan 04/10/2014. followed by weekly Taxol x1 changed to Abraxane 11 from 06/17/2014 completed 08/26/2014 Rt Lumpectomy: 09/29/14: Path CR 0/2 LN Completed adjuvant radiation therapy 12/18/2014  Breast Cancer Surveillance: 1. Breast exam 11/06/2017: right breast upper quadrant feels tight in the muscle. No palpable nodularity. 2. Mammograms 05/15/2017: Benign , breast density category B 3. Bone density 05/14/2015: T score -1.3 osteopenia  Cardiomyopathy:follows with cardiology She continues to stay active and helps take care of her 32 yr old granddaughter as well as her 100 year old aunt.  RTC in 1 yr for continued surveillance    No orders of the defined types were placed in this encounter.  The patient has a good  understanding of the overall plan. she agrees with it. she will call with any problems that may develop before the next visit here.   Harriette Ohara, MD 11/06/17

## 2017-11-06 NOTE — Telephone Encounter (Signed)
Spoke to patient regarding upcoming July 2020 appts

## 2017-11-07 DIAGNOSIS — Z01419 Encounter for gynecological examination (general) (routine) without abnormal findings: Secondary | ICD-10-CM | POA: Diagnosis not present

## 2017-11-07 DIAGNOSIS — R350 Frequency of micturition: Secondary | ICD-10-CM | POA: Diagnosis not present

## 2017-11-16 ENCOUNTER — Other Ambulatory Visit: Payer: Self-pay | Admitting: Hematology and Oncology

## 2017-11-16 ENCOUNTER — Telehealth: Payer: Self-pay

## 2017-11-16 DIAGNOSIS — M810 Age-related osteoporosis without current pathological fracture: Secondary | ICD-10-CM

## 2017-11-16 NOTE — Telephone Encounter (Signed)
Received a call from pt regarding she has not been scheduled for her bone density test.  Called the [facility] center of  and spoke with Tonya.  She verbalized that she sees the order per Dr Lindi Adie and will be calling the pt today to set up appt . Called pt back and let her know to be expecting a call from [facility] to set up bone density.  Pt voiced understanding and no other needs per pt at this time.

## 2017-11-26 DIAGNOSIS — H401112 Primary open-angle glaucoma, right eye, moderate stage: Secondary | ICD-10-CM | POA: Diagnosis not present

## 2017-12-05 ENCOUNTER — Ambulatory Visit
Admission: RE | Admit: 2017-12-05 | Discharge: 2017-12-05 | Disposition: A | Discharge status: Home / Self Care | Payer: Medicare Other | Source: Ambulatory Visit | Attending: Hematology and Oncology | Admitting: Hematology and Oncology

## 2017-12-05 DIAGNOSIS — Z780 Asymptomatic menopausal state: Secondary | ICD-10-CM | POA: Diagnosis not present

## 2017-12-05 DIAGNOSIS — M85851 Other specified disorders of bone density and structure, right thigh: Secondary | ICD-10-CM | POA: Diagnosis not present

## 2017-12-05 DIAGNOSIS — M810 Age-related osteoporosis without current pathological fracture: Secondary | ICD-10-CM

## 2017-12-06 DIAGNOSIS — R350 Frequency of micturition: Secondary | ICD-10-CM | POA: Diagnosis not present

## 2017-12-06 DIAGNOSIS — L292 Pruritus vulvae: Secondary | ICD-10-CM | POA: Diagnosis not present

## 2018-02-13 ENCOUNTER — Other Ambulatory Visit: Payer: Self-pay | Admitting: Family Medicine

## 2018-04-13 ENCOUNTER — Other Ambulatory Visit: Payer: Self-pay | Admitting: Family Medicine

## 2018-04-16 NOTE — Telephone Encounter (Signed)
Dr. Fry please advise on refills. Thanks  

## 2018-05-24 ENCOUNTER — Ambulatory Visit
Admission: RE | Admit: 2018-05-24 | Discharge: 2018-05-24 | Disposition: A | Discharge status: Home / Self Care | Payer: Medicare Other | Source: Ambulatory Visit | Attending: Hematology and Oncology | Admitting: Hematology and Oncology

## 2018-05-24 DIAGNOSIS — Z171 Estrogen receptor negative status [ER-]: Principal | ICD-10-CM

## 2018-05-24 DIAGNOSIS — R922 Inconclusive mammogram: Secondary | ICD-10-CM | POA: Diagnosis not present

## 2018-05-24 DIAGNOSIS — C50411 Malignant neoplasm of upper-outer quadrant of right female breast: Secondary | ICD-10-CM

## 2018-05-24 DIAGNOSIS — Z853 Personal history of malignant neoplasm of breast: Secondary | ICD-10-CM | POA: Diagnosis not present

## 2018-05-24 HISTORY — DX: Malignant neoplasm of unspecified site of unspecified female breast: C50.919

## 2018-05-29 DIAGNOSIS — H353132 Nonexudative age-related macular degeneration, bilateral, intermediate dry stage: Secondary | ICD-10-CM | POA: Diagnosis not present

## 2018-05-29 DIAGNOSIS — H401132 Primary open-angle glaucoma, bilateral, moderate stage: Secondary | ICD-10-CM | POA: Diagnosis not present

## 2018-05-29 DIAGNOSIS — H43813 Vitreous degeneration, bilateral: Secondary | ICD-10-CM | POA: Diagnosis not present

## 2018-05-29 DIAGNOSIS — H524 Presbyopia: Secondary | ICD-10-CM | POA: Diagnosis not present

## 2018-07-30 DIAGNOSIS — H401132 Primary open-angle glaucoma, bilateral, moderate stage: Secondary | ICD-10-CM | POA: Diagnosis not present

## 2018-08-01 ENCOUNTER — Ambulatory Visit (INDEPENDENT_AMBULATORY_CARE_PROVIDER_SITE_OTHER): Payer: Medicare Other | Admitting: Family Medicine

## 2018-08-01 ENCOUNTER — Encounter: Payer: Self-pay | Admitting: Family Medicine

## 2018-08-01 ENCOUNTER — Other Ambulatory Visit: Payer: Self-pay

## 2018-08-01 DIAGNOSIS — J019 Acute sinusitis, unspecified: Secondary | ICD-10-CM | POA: Diagnosis not present

## 2018-08-01 MED ORDER — HYDROCODONE-HOMATROPINE 5-1.5 MG/5ML PO SYRP: 5.0000 mL | ORAL_SOLUTION | ORAL | 0 refills | Status: DC | PRN

## 2018-08-01 MED ORDER — AMOXICILLIN-POT CLAVULANATE 875-125 MG PO TABS: 1.0000 | ORAL_TABLET | Freq: Two times a day (BID) | ORAL | 0 refills | Status: DC

## 2018-08-01 MED ORDER — FLUCONAZOLE 150 MG PO TABS: 150.0000 mg | ORAL_TABLET | Freq: Once | ORAL | 5 refills | Status: AC

## 2018-08-01 NOTE — Progress Notes (Signed)
   Subjective:    Patient ID: Stephanie Olsen, female    DOB: 06-13-1945, 73 y.o.   MRN: [id number]  HPI Virtual Visit via Telephone Note  I connected with the patient on 08/01/18 at 11:30 AM EDT by telephone and verified that I am speaking with the correct person using two identifiers. We attempted to meet via Webex but we had technical difficulties with both the audio and video parts of it.    I discussed the limitations, risks, security and privacy concerns of performing an evaluation and management service by telephone and the availability of in person appointments. I also discussed with the patient that there may be a patient responsible charge related to this service. The patient expressed understanding and agreed to proceed.  Location patient: home Location provider: work or home office Participants present for the call: patient, provider Patient did not have a visit in the prior 7 days to address this/these issue(s).   History of Present Illness: For the past 4 days she has had sinus congestion, PND, blowing green mucus from the nose, and a dry cough. No fever or chest pain or SOB or NVD. Taking Mucinex.    Observations/Objective: Patient sounds cheerful and well on the phone. I do not appreciate any SOB. Speech and thought processing are grossly intact. Patient reported vitals:  Assessment and Plan: Sinusitis, treat with Augmentin. Use Hydromet for cough. Use Diflucan prn.  Alysia Penna, MD   Follow Up Instructions:     [phone] 5-10 [phone] 11-20 [id number] 21-30 I did not refer this patient for an OV in the next 24 hours for this/these issue(s).  I discussed the assessment and treatment plan with the patient. The patient was provided an opportunity to ask questions and all were answered. The patient agreed with the plan and demonstrated an understanding of the instructions.   The patient was advised to call back or seek an in-person evaluation if the symptoms worsen or if  the condition fails to improve as anticipated.  I provided 16 minutes of non-face-to-face time during this encounter.   Alysia Penna, MD    Review of Systems     Objective:   Physical Exam        Assessment & Plan:

## 2018-08-02 ENCOUNTER — Ambulatory Visit: Payer: Self-pay | Admitting: *Deleted

## 2018-08-02 MED ORDER — PROMETHAZINE HCL 25 MG PO TABS: 25.0000 mg | ORAL_TABLET | ORAL | 1 refills | Status: DC | PRN

## 2018-08-02 NOTE — Telephone Encounter (Signed)
  Reason for Disposition . [1] Fever (or feeling feverish) OR cough AND [2] within 14 Days of COVID-19 EXPOSURE (Close Contact)  Answer Assessment - Initial Assessment Questions 1. CLOSE CONTACT: "Who is the person with the confirmed or suspected COVID-19 infection that you were exposed to?"     Pt's daughter has been exposed to COVID 19 patients at work, and her grand ddaughter continues to go to day care 2. PLACE of CONTACT: "Where were you when you were exposed to COVID-19?" (e.g., home, school, medical waiting room; which city?)    home 3. TYPE of CONTACT: "How much contact was there?" (e.g., sitting next to, live in same house, work in same office, same building)     Same house 4. DURATION of CONTACT: "How long were you in contact with the COVID-19 patient?" (e.g., a few seconds, passed by person, a few minutes, live with the patient)     hours 5. DATE of CONTACT: "When did you have contact with a COVID-19 patient?" (e.g., how many days ago)     Numerous visits 6. TRAVEL: "Have you traveled out of the country recently?" If so, "When and where?"     * Also ask about out-of-state travel, since the CDC has identified some high risk cities for community spread in the [place].     * Note: Travel becomes less relevant if there is widespread community transmission where the patient lives.     no 7. COMMUNITY SPREAD: "Are there lots of cases or COVID-19 (community spread) where you live?" (See public health department website, if unsure)   * MAJOR community spread: high number of cases; numbers of cases are increasing; many people hospitalized.   * MINOR community spread: low number of cases; not increasing; few or no people hospitalized    major 8. SYMPTOMS: "Do you have any symptoms?" (e.g., fever, cough, breathing difficulty)     Cough, headache, vomiting, nausea 9. PREGNANCY OR POSTPARTUM: "Is there any chance you are pregnant?" "When was your last menstrual period?" "Did you deliver in the last  2 weeks?"     no 10. HIGH RISK: "Do you have any heart or lung problems? Do you have a weak immune system?" (e.g., CHF, COPD, asthma, HIV positive, chemotherapy, renal failure, diabetes mellitus, sickle cell anemia)       diabetic  Protocols used: CORONAVIRUS (COVID-19) EXPOSURE-A-AH

## 2018-08-02 NOTE — Telephone Encounter (Signed)
Pt called with multiple episodes (5-6 times) of vomiting on 08/02/2018 and 08/01/2018; she continues to have ongoing nausea; the pt said that a few of the episode was associated with her cough; the pt was started on antibiotic on 08/01/2018; she also has a runny nose, and her last temp was 100.0; the pt's daughter is a physical therapistwho has been exposed to COVID patients, and her 71 year old grand daughter goes to daycare;  but most concerned that she may have corona virus; the pt states that she is high risk because of her age, and she is diabetic; recommendations made per nurse triage protocol; the pt was seen on 08/01/2018 and treated for sinus infection; the pt also says that she originally attempted to have a virtual visit but it did not connect, so Dr Sarajane Jews spoke with her over the phone; the pt can be contacted at [phone] or [phone], and a message can be left at both numbers; pt is normally seen by Dr Sarajane Jews, [facility]; will route to office for final disposition.   Reason for Disposition . COVID -19, questions about  Protocols used: CORONAVIRUS (COVID-19) EXPOSURE-A-AH

## 2018-08-02 NOTE — Telephone Encounter (Signed)
Dr. Fry please advise. Thanks  

## 2018-08-02 NOTE — Telephone Encounter (Addendum)
Per Dr Sarajane Jews, please send in Phenergan 25mg  every 4 hrs prn nausea #30 x 1rf  Pt aware of recommendations.  Nothing further needed.

## 2018-08-02 NOTE — Addendum Note (Signed)
Addended by: Virl Cagey on: 08/02/2018 03:14 PM   Modules accepted: Orders

## 2018-08-02 NOTE — Telephone Encounter (Signed)
Notified Connie at [facility] [facility].

## 2018-09-06 ENCOUNTER — Ambulatory Visit (INDEPENDENT_AMBULATORY_CARE_PROVIDER_SITE_OTHER): Payer: Medicare Other | Admitting: Family Medicine

## 2018-09-06 ENCOUNTER — Encounter: Payer: Self-pay | Admitting: Family Medicine

## 2018-09-06 ENCOUNTER — Other Ambulatory Visit: Payer: Self-pay

## 2018-09-06 DIAGNOSIS — Z0184 Encounter for antibody response examination: Secondary | ICD-10-CM

## 2018-09-06 DIAGNOSIS — R6889 Other general symptoms and signs: Secondary | ICD-10-CM | POA: Diagnosis not present

## 2018-09-06 NOTE — Progress Notes (Signed)
Subjective:    Patient ID: Stephanie Olsen, female    DOB: 10/21/1945, 73 y.o.   MRN: [id number]  HPI Virtual Visit via Video Note  I connected with the patient on 09/06/18 at  9:45 AM EDT by a video enabled telemedicine application and verified that I am speaking with the correct person using two identifiers.  Location patient: home Location provider:work or home office Persons participating in the virtual visit: patient, provider  I discussed the limitations of evaluation and management by telemedicine and the availability of in person appointments. The patient expressed understanding and agreed to proceed.   HPI: Here asking to take the antibody test for the Covid-19 virus. We treated her several weeks ago for symptoms that were consistent with a sinusitis, and she totally recovered after a bout 2 weeks. Today she feels fine but she is very curious if she could have had the virus.    ROS: See pertinent positives and negatives per HPI.  Past Medical History:  Diagnosis Date  . Allergy   . Anxiety   . Breast cancer ([place]) 2016   Right Breast  . Breast cancer of upper-outer quadrant of right female breast ([place]) 03/25/2014  . CHF (congestive heart failure) ([place])   . Chicken pox   . Dilated cardiomyopathy ([place])    sees Dr. Haroldine Laws   . Glaucoma    sees Dr. Sarita Haver   . Headache(784.0)   . Hypertension   . Neuromuscular disorder ([place])    peripheral neuropathy from chemo  . Osteopenia   . Osteoporosis    pt states has ostopenia not osteoporosis   . Personal history of chemotherapy    2015-2016  . Personal history of radiation therapy    2016  . Rosacea   . S/P radiation therapy 11/11/2014 through 12/18/2014     Right breast 5040 cGy in 28 sessions     Past Surgical History:  Procedure Laterality Date  . APPENDECTOMY    . BREAST LUMPECTOMY Right 09/28/2014   Malignant  . BREAST LUMPECTOMY  WITH RADIOACTIVE SEED AND SENTINEL LYMPH NODE BIOPSY Right 09/29/2014   Procedure: RIGHT BREAST LUMPECTOMY WITH RADIOACTIVE SEED AND RIGHT AXILLARY SENTINEL LYMPH NODE BIOPSY;  Surgeon: Excell Seltzer, MD;  Location: [facility];  Service: General;  Laterality: Right;  . cataract surgery on both eyes     12 years ago  . COLONOSCOPY  06/2016   per Dr. Earlean Shawl, adenomatous polyps,  repeat in 5 yrs  . EYE SURGERY     laser per right eye related to pressure relief; past cataract surgery bilat   . laser of left eye     1 week ago  . PORT-A-CATH REMOVAL Left 09/29/2014   Procedure: REMOVAL PORT-A-CATH;  Surgeon: Excell Seltzer, MD;  Location: [facility];  Service: General;  Laterality: Left;  . PORTACATH PLACEMENT N/A 04/07/2014   Procedure: INSERTION PORT-A-CATH;  Surgeon: Excell Seltzer, MD;  Location: [facility];  Service: General;  Laterality: N/A;    Family History  Problem Relation Age of Onset  . Lung cancer Maternal Uncle        heavy smoker  . Colon cancer Paternal Grandmother 33  . Lung cancer Cousin        smoker  . Leukemia Cousin 3  . Arthritis Other   . Coronary artery disease Other   . Hypertension Other   . Stroke Other   . Macular degeneration Other      Current Outpatient  Medications:  .  Ascorbic Acid (VITAMIN C) 500 MG tablet, Take 500 mg by mouth 2 (two) times daily. , Disp: , Rfl:  .  aspirin EC 81 MG tablet, Take 81 mg by mouth daily., Disp: , Rfl:  .  Calcium Carb-Cholecalciferol (CALCIUM 600/VITAMIN D3) 600-800 MG-UNIT TABS, Take 1 tablet by mouth 2 (two) times daily. , Disp: , Rfl:  .  cetirizine (ZYRTEC) 10 MG tablet, Take 10 mg by mouth daily as needed (seasonal allergies). , Disp: , Rfl:  .  clonazePAM (KLONOPIN) 2 MG tablet, Take 1 tablet (2 mg total) by mouth 2 (two) times daily as needed for anxiety., Disp: 180 tablet, Rfl: 1 .  cyclobenzaprine (FLEXERIL) 10 MG tablet, TAKE 1 TABLET BY MOUTH THREE TIMES DAILY AS NEEDED FOR  MUSCLE SPASMS, Disp: 90 tablet, Rfl: 5 .  fish oil-omega-3 fatty acids 1000 MG capsule, Take 1 g by mouth at bedtime. , Disp: , Rfl:  .  fluticasone (FLONASE) 50 MCG/ACT nasal spray, USE 2 SPRAYS IN EACH NOSTRIL EVERY DAY, SHAKE LIQUID, Disp: 48 g, Rfl: 1 .  latanoprost (XALATAN) 0.005 % ophthalmic solution, Place 1 drop into both eyes at bedtime., Disp: , Rfl:  .  Multiple Vitamins-Minerals (PRESERVISION AREDS 2 PO), Take 1 capsule by mouth 2 (two) times daily. , Disp: , Rfl:  .  timolol (TIMOPTIC-XR) 0.5 % ophthalmic gel-forming, Place 1 drop into both eyes daily. , Disp: , Rfl:  .  amoxicillin-clavulanate (AUGMENTIN) 875-125 MG tablet, Take 1 tablet by mouth 2 (two) times daily. (Patient not taking: Reported on 09/06/2018), Disp: 20 tablet, Rfl: 0 .  HYDROcodone-homatropine (HYDROMET) 5-1.5 MG/5ML syrup, Take 5 mLs by mouth every 4 (four) hours as needed. (Patient not taking: Reported on 09/06/2018), Disp: 240 mL, Rfl: 0 .  promethazine (PHENERGAN) 25 MG tablet, Take 1 tablet (25 mg total) by mouth every 4 (four) hours as needed for nausea or vomiting. (Patient not taking: Reported on 09/06/2018), Disp: 30 tablet, Rfl: 1  EXAM:  VITALS per patient if applicable:  GENERAL: alert, oriented, appears well and in no acute distress  HEENT: atraumatic, conjunttiva clear, no obvious abnormalities on inspection of external nose and ears  NECK: normal movements of the head and neck  LUNGS: on inspection no signs of respiratory distress, breathing rate appears normal, no obvious gross SOB, gasping or wheezing  CV: no obvious cyanosis  MS: moves all visible extremities without noticeable abnormality  PSYCH/NEURO: pleasant and cooperative, no obvious depression or anxiety, speech and thought processing grossly intact  ASSESSMENT AND PLAN: S/P sinusitis. She will come in today for the Covid-19 IgG test.  Alysia Penna, MD  Discussed the following assessment and plan:  Flu-like symptoms - Plan: SAR  CoV2 Serology (COVID 19)AB(IGG)IA     I discussed the assessment and treatment plan with the patient. The patient was provided an opportunity to ask questions and all were answered. The patient agreed with the plan and demonstrated an understanding of the instructions.   The patient was advised to call back or seek an in-person evaluation if the symptoms worsen or if the condition fails to improve as anticipated.     Review of Systems     Objective:   Physical Exam        Assessment & Plan:

## 2018-09-07 LAB — SAR COV2 SEROLOGY (COVID19)AB(IGG),IA: SARS CoV2 AB IGG: NEGATIVE

## 2018-09-17 ENCOUNTER — Telehealth: Payer: Self-pay | Admitting: *Deleted

## 2018-09-17 NOTE — Telephone Encounter (Signed)
Called and spoke with pt and she is aware of results.

## 2018-09-17 NOTE — Telephone Encounter (Signed)
Copied from [place] [id number]. Topic: General - Inquiry >> Sep 17, 2018 11:13 AM Richardo Priest, NT wrote: Reason for CRM: Patient is requesting a call back to go over her lab results. Call back is [phone].

## 2018-09-24 ENCOUNTER — Ambulatory Visit: Payer: Medicare Other

## 2018-10-29 ENCOUNTER — Other Ambulatory Visit: Payer: Self-pay | Admitting: Family Medicine

## 2018-10-29 ENCOUNTER — Telehealth: Payer: Self-pay

## 2018-10-29 ENCOUNTER — Telehealth: Payer: Self-pay | Admitting: Hematology and Oncology

## 2018-10-29 NOTE — Telephone Encounter (Signed)
I talk with patient she has many questions about her health. I did not convert

## 2018-10-29 NOTE — Telephone Encounter (Signed)
RN spoke with patient per request.  Pt will prefer to have visit on 7/7 in person.

## 2018-10-29 NOTE — Assessment & Plan Note (Signed)
Right breast invasive ductal carcinoma, palpable mass, 5 cm by mammogram and 5.7 cm by MRI grade 3, ER PR HER-2 negative, associated skin dimpling, T3, N0, M0 stage IIB Ki-67 90%  Chemotherapy summary: Neoadjuvant chemotherapy with dose dense Adriamycin and Cytoxan 04/10/2014. followed by weekly Taxol x1 changed to Abraxane 11 from 06/17/2014 completed 08/26/2014 Rt Lumpectomy: 09/29/14: Path CR 0/2 LN Completed adjuvant radiation therapy 12/18/2014  Breast Cancer Surveillance: 1. Breast exam 11/06/2017: right breast upper quadrant feels tight in the muscle. No palpable nodularity. 2. Mammograms 05/24/2018: Benign , breast density category B 3. Bone density 05/14/2015: T score -1.3 osteopenia  Cardiomyopathy:follows with cardiology She continues to stay active and helps take care of her 25 yr old granddaughter as well as her 88 year old aunt.  RTC in 1 yr for continued surveillance

## 2018-11-04 ENCOUNTER — Ambulatory Visit: Payer: Medicare Other

## 2018-11-04 NOTE — Progress Notes (Signed)
Patient Care Team: Laurey Morale, MD as PCP - Haskell Riling, MD as Consulting Physician (General Surgery) Nicholas Lose, MD as Consulting Physician (Hematology and Oncology) Arloa Koh, MD (Inactive) as Consulting Physician (Radiation Oncology) Holley Bouche, NP (Inactive) as Nurse Practitioner (Nurse Practitioner) Sylvan Cheese, NP as Nurse Practitioner (Hematology and Oncology)  DIAGNOSIS:    ICD-10-CM   1. Malignant neoplasm of upper-outer quadrant of right breast in female, estrogen receptor negative ([place])  C50.411    Z17.1     SUMMARY OF ONCOLOGIC HISTORY: Oncology History  Breast cancer of upper-outer quadrant of right female breast ([place])  03/18/2014 Mammogram   Right breast: suspicious mass 10:00 position measuring 4.9 x 3 x 5 cm, 2 lower right axillary lymph nodes mildly thickened cortices   03/23/2014 Initial Biopsy   Right breast needle biopsy 9:00: Invasive ductal carcinoma grade 3, ER- (0%), PR- (0%), HER-2 negative (ratio 1.3), Ki67 95%   03/31/2014 Breast MRI   Right breast mass 9:00 upper outer quadrant with contiguous skin involvement by 5.7 cm, right axillary lymph nodes normal in size   04/01/2014 Clinical Stage   Stage IIB: T2 N0   04/10/2014 - 08/26/2014 Neo-Adjuvant Chemotherapy   Dose dense doxorubicin and cyclophosphamide 4 cycles followed by weekly paclitaxel 12. After week 1, paclitaxel changed to nab-paclitaxel    08/28/2014 Breast MRI   Decrease in the size of the right breast mass from 5.7 cm to 8 mm, skin enhancement is no longer seen, decreased in size of axillary lymph nodes, excellent response to chemotherapy   09/29/2014 Definitive Surgery   Right Lumpectomy / SLNB ([facility]): pathologic CR, 2 LN removed and negative for malignancy (0/2 LN)   11/11/2014 - 12/18/2014 Radiation Therapy   Adjuvant RT [facility]): Right breast 50.4 Gy over 28 fractions   02/24/2015 Survivorship   Survivorship care plan completed and  mailed to patient in lieu of in person visit.     CHIEF COMPLIANT: Surveillance of right breast cancer  INTERVAL HISTORY: Stephanie Olsen is a 73 y.o. with above-mentioned history of right breast cancer treated with neoadjuvant chemotherapy followed by lumpectomy and radiation and is currently in surveillance. I last saw her a year ago. Bone density scan on 12/05/16 showed a T-score of -1.2. Mammogram on 05/24/18 showed no evidence of malignancy bilaterally. She presents to the clinic today for annual follow-up.   REVIEW OF SYSTEMS:   Constitutional: Denies fevers, chills or abnormal weight loss Eyes: Denies blurriness of vision Ears, nose, mouth, throat, and face: Denies mucositis or sore throat Respiratory: Denies cough, dyspnea or wheezes Cardiovascular: Denies palpitation, chest discomfort Gastrointestinal: Denies nausea, heartburn or change in bowel habits Skin: Denies abnormal skin rashes Lymphatics: Denies new lymphadenopathy or easy bruising Neurological: Denies numbness, tingling or new weaknesses Behavioral/Psych: Mood is stable, no new changes  Extremities: No lower extremity edema Breast: denies any pain or lumps or nodules in either breasts All other systems were reviewed with the patient and are negative.  I have reviewed the past medical history, past surgical history, social history and family history with the patient and they are unchanged from previous note.  ALLERGIES:  is allergic to fosamax [alendronate sodium].  MEDICATIONS:  Current Outpatient Medications  Medication Sig Dispense Refill  . Ascorbic Acid (VITAMIN C) 500 MG tablet Take 500 mg by mouth 2 (two) times daily.     Marland Kitchen aspirin EC 81 MG tablet Take 81 mg by mouth daily.    . Calcium Carb-Cholecalciferol (  CALCIUM 600/VITAMIN D3) 600-800 MG-UNIT TABS Take 1 tablet by mouth 2 (two) times daily.     . cetirizine (ZYRTEC) 10 MG tablet Take 10 mg by mouth daily as needed (seasonal allergies).     . clonazePAM  (KLONOPIN) 2 MG tablet Take 1 tablet (2 mg total) by mouth 2 (two) times daily as needed for anxiety. 180 tablet 1  . cyclobenzaprine (FLEXERIL) 10 MG tablet TAKE 1 TABLET BY MOUTH THREE TIMES DAILY AS NEEDED FOR MUSCLE SPASMS 90 tablet 5  . fish oil-omega-3 fatty acids 1000 MG capsule Take 1 g by mouth at bedtime.     . fluticasone (FLONASE) 50 MCG/ACT nasal spray USE 2 SPRAYS IN EACH NOSTRIL EVERY DAY; SHAKE LIQUID 48 g 1  . latanoprost (XALATAN) 0.005 % ophthalmic solution Place 1 drop into both eyes at bedtime.    . Multiple Vitamins-Minerals (PRESERVISION AREDS 2 PO) Take 1 capsule by mouth 2 (two) times daily.     . timolol (TIMOPTIC-XR) 0.5 % ophthalmic gel-forming Place 1 drop into both eyes daily.      No current facility-administered medications for this visit.     PHYSICAL EXAMINATION: ECOG PERFORMANCE STATUS: 1 - Symptomatic but completely ambulatory  Vitals:   11/05/18 1002  BP: 105/80  Pulse: 75  Resp: 17  Temp: 98.9 F (37.2 C)   Filed Weights   11/05/18 1002  Weight: 194 lb 1.6 oz (88 kg)    GENERAL: alert, no distress and comfortable SKIN: skin color, texture, turgor are normal, no rashes or significant lesions EYES: normal, Conjunctiva are pink and non-injected, sclera clear OROPHARYNX: no exudate, no erythema and lips, buccal mucosa, and tongue normal  NECK: supple, thyroid normal size, non-tender, without nodularity LYMPH: no palpable lymphadenopathy in the cervical, axillary or inguinal LUNGS: clear to auscultation and percussion with normal breathing effort HEART: regular rate & rhythm and no murmurs and no lower extremity edema ABDOMEN: abdomen soft, non-tender and normal bowel sounds MUSCULOSKELETAL: no cyanosis of digits and no clubbing  NEURO: alert & oriented x 3 with fluent speech, no focal motor/sensory deficits EXTREMITIES: No lower extremity edema BREAST: No palpable masses or nodules in either right or left breasts. No palpable axillary  supraclavicular or infraclavicular adenopathy no breast tenderness or nipple discharge. (exam performed in the presence of a chaperone)  LABORATORY DATA:  I have reviewed the data as listed CMP Latest Ref Rng & Units 09/25/2017 07/12/2016 05/18/2015  Glucose 70 - 99 mg/dL 97 95 98  BUN 6 - 23 mg/dL '12 13 8  ' Creatinine 0.40 - 1.20 mg/dL 0.64 0.74 0.67  Sodium 135 - 145 mEq/L 136 139 139  Potassium 3.5 - 5.1 mEq/L 3.9 4.4 5.2(H)  Chloride 96 - 112 mEq/L 101 103 104  CO2 19 - 32 mEq/L '27 28 29  ' Calcium 8.4 - 10.5 mg/dL 9.5 10.0 9.4  Total Protein 6.0 - 8.3 g/dL 6.9 7.0 6.8  Total Bilirubin 0.2 - 1.2 mg/dL 0.6 0.7 0.8  Alkaline Phos 39 - 117 U/L 55 64 88  AST 0 - 37 U/L '19 17 17  ' ALT 0 - 35 U/L '17 13 13    ' Lab Results  Component Value Date   WBC 4.4 09/25/2017   HGB 14.1 09/25/2017   HCT 41.0 09/25/2017   MCV 88.2 09/25/2017   PLT 233.0 09/25/2017   NEUTROABS 2.0 09/25/2017    ASSESSMENT & PLAN:  Breast cancer of upper-outer quadrant of right female breast Right breast invasive ductal carcinoma, palpable mass,  5 cm by mammogram and 5.7 cm by MRI grade 3, ER PR HER-2 negative, associated skin dimpling, T3, N0, M0 stage IIB Ki-67 90%  Chemotherapy summary: Neoadjuvant chemotherapy with dose dense Adriamycin and Cytoxan 04/10/2014. followed by weekly Taxol x1 changed to Abraxane 11 from 06/17/2014 completed 08/26/2014 Rt Lumpectomy: 09/29/14: Path CR 0/2 LN Completed adjuvant radiation therapy 12/18/2014  Breast Cancer Surveillance: 1. Breast exam 11/06/2017: right breast upper quadrant feels tight in the muscle. No palpable nodularity. 2. Mammograms 05/24/2018: Benign , breast density category B 3. Bone density 05/14/2015: T score -1.3 osteopenia  Cardiomyopathy:follows with cardiology She continues to stay active and helps take care of her 74 yr old granddaughter as well as her 72 year old aunt.  RTC in 1 yr for continued surveillance    No orders of the defined types  were placed in this encounter.  The patient has a good understanding of the overall plan. she agrees with it. she will call with any problems that may develop before the next visit here.  Nicholas Lose, MD 11/05/2018  Julious Oka Dorshimer am acting as scribe for Dr. Nicholas Lose.  I have reviewed the above documentation for accuracy and completeness, and I agree with the above.

## 2018-11-05 ENCOUNTER — Other Ambulatory Visit: Payer: Self-pay

## 2018-11-05 ENCOUNTER — Inpatient Hospital Stay: Payer: Medicare Other | Attending: Hematology and Oncology | Admitting: Hematology and Oncology

## 2018-11-05 DIAGNOSIS — Z853 Personal history of malignant neoplasm of breast: Secondary | ICD-10-CM

## 2018-11-05 DIAGNOSIS — Z79899 Other long term (current) drug therapy: Secondary | ICD-10-CM | POA: Insufficient documentation

## 2018-11-05 DIAGNOSIS — Z923 Personal history of irradiation: Secondary | ICD-10-CM | POA: Insufficient documentation

## 2018-11-05 DIAGNOSIS — Z9221 Personal history of antineoplastic chemotherapy: Secondary | ICD-10-CM | POA: Diagnosis not present

## 2018-11-05 DIAGNOSIS — M8580 Other specified disorders of bone density and structure, unspecified site: Secondary | ICD-10-CM | POA: Diagnosis not present

## 2018-11-05 DIAGNOSIS — Z7982 Long term (current) use of aspirin: Secondary | ICD-10-CM | POA: Insufficient documentation

## 2018-11-05 DIAGNOSIS — C50411 Malignant neoplasm of upper-outer quadrant of right female breast: Secondary | ICD-10-CM

## 2018-11-05 DIAGNOSIS — I429 Cardiomyopathy, unspecified: Secondary | ICD-10-CM | POA: Insufficient documentation

## 2018-11-06 ENCOUNTER — Telehealth: Payer: Self-pay | Admitting: Hematology and Oncology

## 2018-11-06 NOTE — Telephone Encounter (Signed)
I talk with patient regarding schedule  

## 2018-11-27 DIAGNOSIS — H353132 Nonexudative age-related macular degeneration, bilateral, intermediate dry stage: Secondary | ICD-10-CM | POA: Diagnosis not present

## 2018-11-27 DIAGNOSIS — H401132 Primary open-angle glaucoma, bilateral, moderate stage: Secondary | ICD-10-CM | POA: Diagnosis not present

## 2018-12-27 ENCOUNTER — Other Ambulatory Visit: Payer: Self-pay | Admitting: Family Medicine

## 2018-12-27 MED ORDER — CLONAZEPAM 2 MG PO TABS: 2.0000 mg | ORAL_TABLET | Freq: Two times a day (BID) | ORAL | 1 refills | Status: DC | PRN

## 2018-12-27 NOTE — Telephone Encounter (Signed)
Done

## 2018-12-27 NOTE — Telephone Encounter (Signed)
Last filled 07/12/16 Last OV 09/06/18  Ok to fill?

## 2018-12-27 NOTE — Addendum Note (Signed)
Addended by: Alysia Penna A on: 12/27/2018 01:10 PM   Modules accepted: Orders

## 2018-12-27 NOTE — Telephone Encounter (Signed)
Copied from [place] [id number]. Topic: Quick Communication - Rx Refill/Question >> Dec 27, 2018  9:55 AM Rainey Pines A wrote: Medication: clonazePAM (KLONOPIN) 2 MG tablet (Patient is requesting 90 day supply and new prescription to be sent to pharmacy.)  Has the patient contacted their pharmacy? Yes (Agent: If no, request that the patient contact the pharmacy for the refill.) (Agent: If yes, when and what did the pharmacy advise?)Contact PCP  Preferred Pharmacy (with phone number or street name):[facility] [place], [address] AT [address] [phone] (Phone) [phone] (Fax)    Agent: Please be advised that RX refills may take up to 3 business days. We ask that you follow-up with your pharmacy.

## 2018-12-27 NOTE — Telephone Encounter (Signed)
Requested medication (s) are due for refill today: no Requested medication (s) are on the active medication list: yes  Last refill:  06/2016 Future visit scheduled: no  Notes to clinic:  This refill cannot be delegated   Requested Prescriptions  Pending Prescriptions Disp Refills   clonazePAM (KLONOPIN) 2 MG tablet 180 tablet 1    Sig: Take 1 tablet (2 mg total) by mouth 2 (two) times daily as needed for anxiety.     Not Delegated - Psychiatry:  Anxiolytics/Hypnotics Failed - 12/27/2018  9:59 AM      Failed - This refill cannot be delegated      Failed - Urine Drug Screen completed in last 360 days.      Passed - Valid encounter within last 6 months    Recent Outpatient Visits          3 months ago Flu-like symptoms   [facility] at [place], [place]   4 months ago Acute sinusitis, recurrence not specified, unspecified location   [facility], Ishmael Holter, MD   1 year ago Essential hypertension   [facility]rap[facility] at [facility], Ishmael Holter, MD   1 year ago [facility], Ishmael Holter, MD   2 years ago [facility] at [place], Ishmael Holter, MD

## 2019-02-06 DIAGNOSIS — Z23 Encounter for immunization: Secondary | ICD-10-CM | POA: Diagnosis not present

## 2019-04-01 DIAGNOSIS — H52203 Unspecified astigmatism, bilateral: Secondary | ICD-10-CM | POA: Diagnosis not present

## 2019-04-01 DIAGNOSIS — H353132 Nonexudative age-related macular degeneration, bilateral, intermediate dry stage: Secondary | ICD-10-CM | POA: Diagnosis not present

## 2019-04-01 DIAGNOSIS — H401132 Primary open-angle glaucoma, bilateral, moderate stage: Secondary | ICD-10-CM | POA: Diagnosis not present

## 2019-04-01 DIAGNOSIS — H04123 Dry eye syndrome of bilateral lacrimal glands: Secondary | ICD-10-CM | POA: Diagnosis not present

## 2019-04-28 ENCOUNTER — Other Ambulatory Visit: Payer: Self-pay | Admitting: Hematology and Oncology

## 2019-04-28 DIAGNOSIS — Z853 Personal history of malignant neoplasm of breast: Secondary | ICD-10-CM

## 2019-05-06 ENCOUNTER — Ambulatory Visit (INDEPENDENT_AMBULATORY_CARE_PROVIDER_SITE_OTHER): Payer: Medicare Other

## 2019-05-06 VITALS — Ht 65.0 in | Wt 190.0 lb

## 2019-05-06 DIAGNOSIS — Z13820 Encounter for screening for osteoporosis: Secondary | ICD-10-CM | POA: Diagnosis not present

## 2019-05-06 DIAGNOSIS — Z0000 Encounter for general adult medical examination without abnormal findings: Secondary | ICD-10-CM

## 2019-05-06 DIAGNOSIS — Z780 Asymptomatic menopausal state: Secondary | ICD-10-CM | POA: Diagnosis not present

## 2019-05-06 NOTE — Patient Instructions (Addendum)
[name] , Thank you for taking time to come participate in your Medicare Wellness Visit. I appreciate your ongoing commitment to your health goals. Please review the following plan we discussed and let me know if I can assist you in the future.   Screening recommendations/referrals: Colorectal Screening: colonoscopy 10/03/2016; next one due 10/03/2021.  Mammogram: scheduled 05/26/2019 Bone Density: last one completed 12/05/2017; next one due 12/07/2019. Order sent to [facility] and they will contact you to schedule.   Vision and Dental Exams: Recommended annual ophthalmology exams for early detection of glaucoma and other disorders of the eye Recommended annual dental exams for proper oral hygiene. Patient states she has these done as recommended.   Diabetic Exams: Diabetic Eye Exam: N/A Diabetic Foot Exam: N/A  Vaccinations: Influenza vaccine: completed 01/2019 at [facility] per patient. Chart updated.  Pneumococcal vaccine: completed 05/18/2015. Needs PPV23 and can have at office visit later this month. Tdap vaccine: completed 11/01/2015; due again 10/31/2025.  Shingles vaccine: Please call your pharmacy to determine your out of pocket expense for the Shingrix vaccine. You may receive this vaccine at your local pharmacy. This is a series of two injections to be given 2-6 months apart.   Advanced directives: Advance directives discussed with you today. I have provided a copy for you to complete at home and have notarized. Once this is complete please bring a copy in to our office so we can scan it into your chart.  Goals: Recommend to drink at least 6-8 8oz glasses of water per day. Continue to exercise for at least 150 minutes per week. Recommend to decrease portion sizes by eating 3 small healthy meals and at least 2 healthy snacks per day.  Next appointment: 05/07/2020 9:00am with Nurse Health Advisor Preventive Care 65 Years and Older, Female Preventive care refers  to lifestyle choices and visits with your health care provider that can promote health and wellness. What does preventive care include?  A yearly physical exam. This is also called an annual well check.  Dental exams once or twice a year.  Routine eye exams. Ask your health care provider how often you should have your eyes checked.  Personal lifestyle choices, including:  Daily care of your teeth and gums.  Regular physical activity.  Eating a healthy diet.  Avoiding tobacco and drug use.  Limiting alcohol use.  Practicing safe sex.  Taking low-dose aspirin every day if recommended by your health care provider.  Taking vitamin and mineral supplements as recommended by your health care provider. What happens during an annual well check? The services and screenings done by your health care provider during your annual well check will depend on your age, overall health, lifestyle risk factors, and family history of disease. Counseling  Your health care provider may ask you questions about your:  Alcohol use.  Tobacco use.  Drug use.  Emotional well-being.  Home and relationship well-being.  Sexual activity.  Eating habits.  History of falls.  Memory and ability to understand (cognition).  Work and work Statistician.  Reproductive health. Screening  You may have the following tests or measurements:  Height, weight, and BMI.  Blood pressure.  Lipid and cholesterol levels. These may be checked every 5 years, or more frequently if you are over 10 years old.  Skin check.  Lung cancer screening. You may have this screening every year starting at age 41 if you have a 30-pack-year history of smoking and currently smoke or have quit within  the past 15 years.  Fecal occult blood test (FOBT) of the stool. You may have this test every year starting at age 77.  Flexible sigmoidoscopy or colonoscopy. You may have a sigmoidoscopy every 5 years or a colonoscopy every 10  years starting at age 3.  Hepatitis C blood test.  Hepatitis B blood test.  Sexually transmitted disease (STD) testing.  Diabetes screening. This is done by checking your blood sugar (glucose) after you have not eaten for a while (fasting). You may have this done every 1-3 years.  Bone density scan. This is done to screen for osteoporosis. You may have this done starting at age 27.  Mammogram. This may be done every 1-2 years. Talk to your health care provider about how often you should have regular mammograms. Talk with your health care provider about your test results, treatment options, and if necessary, the need for more tests. Vaccines  Your health care provider may recommend certain vaccines, such as:  Influenza vaccine. This is recommended every year.  Tetanus, diphtheria, and acellular pertussis (Tdap, Td) vaccine. You may need a Td booster every 10 years.  Zoster vaccine. You may need this after age 90.  Pneumococcal 13-valent conjugate (PCV13) vaccine. One dose is recommended after age 21.  Pneumococcal polysaccharide (PPSV23) vaccine. One dose is recommended after age 75. Talk to your health care provider about which screenings and vaccines you need and how often you need them. This information is not intended to replace advice given to you by your health care provider. Make sure you discuss any questions you have with your health care provider. Document Released: 05/14/2015 Document Revised: 01/05/2016 Document Reviewed: 02/16/2015 Elsevier Interactive Patient Education  2017 [place] Prevention in the Home Falls can cause injuries. They can happen to people of all ages. There are many things you can do to make your home safe and to help prevent falls. What can I do on the outside of my home?  Regularly fix the edges of walkways and driveways and fix any cracks.  Remove anything that might make you trip as you walk through a door, such as a raised step or  threshold.  Trim any bushes or trees on the path to your home.  Use bright outdoor lighting.  Clear any walking paths of anything that might make someone trip, such as rocks or tools.  Regularly check to see if handrails are loose or broken. Make sure that both sides of any steps have handrails.  Any raised decks and porches should have guardrails on the edges.  Have any leaves, snow, or ice cleared regularly.  Use sand or salt on walking paths during winter.  Clean up any spills in your garage right away. This includes oil or grease spills. What can I do in the bathroom?  Use night lights.  Install grab bars by the toilet and in the tub and shower. Do not use towel bars as grab bars.  Use non-skid mats or decals in the tub or shower.  If you need to sit down in the shower, use a plastic, non-slip stool.  Keep the floor dry. Clean up any water that spills on the floor as soon as it happens.  Remove soap buildup in the tub or shower regularly.  Attach bath mats securely with double-sided non-slip rug tape.  Do not have throw rugs and other things on the floor that can make you trip. What can I do in the bedroom?  Use night  lights.  Make sure that you have a light by your bed that is easy to reach.  Do not use any sheets or blankets that are too big for your bed. They should not hang down onto the floor.  Have a firm chair that has side arms. You can use this for support while you get dressed.  Do not have throw rugs and other things on the floor that can make you trip. What can I do in the kitchen?  Clean up any spills right away.  Avoid walking on wet floors.  Keep items that you use a lot in easy-to-reach places.  If you need to reach something above you, use a strong step stool that has a grab bar.  Keep electrical cords out of the way.  Do not use floor polish or wax that makes floors slippery. If you must use wax, use non-skid floor wax.  Do not have  throw rugs and other things on the floor that can make you trip. What can I do with my stairs?  Do not leave any items on the stairs.  Make sure that there are handrails on both sides of the stairs and use them. Fix handrails that are broken or loose. Make sure that handrails are as long as the stairways.  Check any carpeting to make sure that it is firmly attached to the stairs. Fix any carpet that is loose or worn.  Avoid having throw rugs at the top or bottom of the stairs. If you do have throw rugs, attach them to the floor with carpet tape.  Make sure that you have a light switch at the top of the stairs and the bottom of the stairs. If you do not have them, ask someone to add them for you. What else can I do to help prevent falls?  Wear shoes that:  Do not have high heels.  Have rubber bottoms.  Are comfortable and fit you well.  Are closed at the toe. Do not wear sandals.  If you use a stepladder:  Make sure that it is fully opened. Do not climb a closed stepladder.  Make sure that both sides of the stepladder are locked into place.  Ask someone to hold it for you, if possible.  Clearly mark and make sure that you can see:  Any grab bars or handrails.  First and last steps.  Where the edge of each step is.  Use tools that help you move around (mobility aids) if they are needed. These include:  Canes.  Walkers.  Scooters.  Crutches.  Turn on the lights when you go into a dark area. Replace any light bulbs as soon as they burn out.  Set up your furniture so you have a clear path. Avoid moving your furniture around.  If any of your floors are uneven, fix them.  If there are any pets around you, be aware of where they are.  Review your medicines with your doctor. Some medicines can make you feel dizzy. This can increase your chance of falling. Ask your doctor what other things that you can do to help prevent falls. This information is not intended to  replace advice given to you by your health care provider. Make sure you discuss any questions you have with your health care provider. Document Released: 02/11/2009 Document Revised: 09/23/2015 Document Reviewed: 05/22/2014 Elsevier Interactive Patient Education  2017 [employer].
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
## 2019-05-22 ENCOUNTER — Ambulatory Visit: Payer: Medicare Other | Attending: Internal Medicine

## 2019-05-22 DIAGNOSIS — Z23 Encounter for immunization: Secondary | ICD-10-CM | POA: Insufficient documentation

## 2019-05-22 NOTE — Progress Notes (Signed)
   Covid-19 Vaccination Clinic  Name:  Stephanie Olsen    MRN: [id number] DOB: 1945-07-06  05/22/2019  [name] was observed post Covid-19 immunization for 15 minutes without incidence. She was provided with Vaccine Information Sheet and instruction to access the V-Safe system.   [name] was instructed to call 911 with any severe reactions post vaccine: Marland Kitchen Difficulty breathing  . Swelling of your face and throat  . A fast heartbeat  . A bad rash all over your body  . Dizziness and weakness    Immunizations Administered    Name Date Dose VIS Date Route   Pfizer COVID-19 Vaccine 05/22/2019  6:22 PM 0.3 mL 04/11/2019 Intramuscular   Manufacturer: [place]   Lot: [id number]   [place]: [id number]

## 2019-05-26 ENCOUNTER — Other Ambulatory Visit: Payer: Self-pay

## 2019-05-26 ENCOUNTER — Ambulatory Visit
Admission: RE | Admit: 2019-05-26 | Discharge: 2019-05-26 | Disposition: A | Discharge status: Home / Self Care | Payer: Medicare Other | Source: Ambulatory Visit | Attending: Hematology and Oncology | Admitting: Hematology and Oncology

## 2019-05-26 DIAGNOSIS — Z853 Personal history of malignant neoplasm of breast: Secondary | ICD-10-CM

## 2019-05-26 DIAGNOSIS — R928 Other abnormal and inconclusive findings on diagnostic imaging of breast: Secondary | ICD-10-CM | POA: Diagnosis not present

## 2019-05-28 ENCOUNTER — Other Ambulatory Visit: Payer: Self-pay

## 2019-05-28 ENCOUNTER — Encounter: Payer: Self-pay | Admitting: Family Medicine

## 2019-05-28 ENCOUNTER — Ambulatory Visit (INDEPENDENT_AMBULATORY_CARE_PROVIDER_SITE_OTHER): Payer: Medicare Other | Admitting: Family Medicine

## 2019-05-28 VITALS — BP 130/80 | HR 98 | Temp 97.8°F | Ht 65.0 in | Wt 194.0 lb

## 2019-05-28 DIAGNOSIS — I10 Essential (primary) hypertension: Secondary | ICD-10-CM | POA: Diagnosis not present

## 2019-05-28 DIAGNOSIS — F411 Generalized anxiety disorder: Secondary | ICD-10-CM

## 2019-05-28 DIAGNOSIS — E785 Hyperlipidemia, unspecified: Secondary | ICD-10-CM | POA: Diagnosis not present

## 2019-05-28 DIAGNOSIS — M810 Age-related osteoporosis without current pathological fracture: Secondary | ICD-10-CM | POA: Diagnosis not present

## 2019-05-28 DIAGNOSIS — I420 Dilated cardiomyopathy: Secondary | ICD-10-CM

## 2019-05-28 DIAGNOSIS — C50411 Malignant neoplasm of upper-outer quadrant of right female breast: Secondary | ICD-10-CM | POA: Diagnosis not present

## 2019-05-28 DIAGNOSIS — Z171 Estrogen receptor negative status [ER-]: Secondary | ICD-10-CM

## 2019-05-28 LAB — LIPID PANEL
Cholesterol: 206 mg/dL — ABNORMAL HIGH (ref 0–200)
HDL: 74 mg/dL (ref >39.00)
LDL Cholesterol: 110 mg/dL — ABNORMAL HIGH (ref 0–99)
NonHDL: 131.6
Total CHOL/HDL Ratio: 3
Triglycerides: 107 mg/dL (ref 0.0–149.0)
VLDL: 21.4 mg/dL (ref 0.0–40.0)

## 2019-05-28 LAB — TSH: TSH: 2.64 u[IU]/mL (ref 0.35–4.50)

## 2019-05-28 LAB — CBC WITH DIFFERENTIAL/PLATELET
Basophils Absolute: 0 10*3/uL (ref 0.0–0.1)
Basophils Relative: 0.6 % (ref 0.0–3.0)
Eosinophils Absolute: 0.3 10*3/uL (ref 0.0–0.7)
Eosinophils Relative: 5 % (ref 0.0–5.0)
HCT: 43.4 % (ref 36.0–46.0)
Hemoglobin: 14.5 g/dL (ref 12.0–15.0)
Lymphocytes Relative: 28 % (ref 12.0–46.0)
Lymphs Abs: 1.4 10*3/uL (ref 0.7–4.0)
MCHC: 33.5 g/dL (ref 30.0–36.0)
MCV: 90.5 fl (ref 78.0–100.0)
Monocytes Absolute: 0.5 10*3/uL (ref 0.1–1.0)
Monocytes Relative: 10.4 % (ref 3.0–12.0)
Neutro Abs: 2.9 10*3/uL (ref 1.4–7.7)
Neutrophils Relative %: 56 % (ref 43.0–77.0)
Platelets: 243 10*3/uL (ref 150.0–400.0)
RBC: 4.8 Mil/uL (ref 3.87–5.11)
RDW: 13.8 % (ref 11.5–15.5)
WBC: 5.1 10*3/uL (ref 4.0–10.5)

## 2019-05-28 LAB — HEPATIC FUNCTION PANEL
ALT: 29 U/L (ref 0–35)
AST: 26 U/L (ref 0–37)
Albumin: 3.9 g/dL (ref 3.5–5.2)
Alkaline Phosphatase: 70 U/L (ref 39–117)
Bilirubin, Direct: 0.2 mg/dL (ref 0.0–0.3)
Total Bilirubin: 0.9 mg/dL (ref 0.2–1.2)
Total Protein: 6.8 g/dL (ref 6.0–8.3)

## 2019-05-28 LAB — BASIC METABOLIC PANEL
BUN: 12 mg/dL (ref 6–23)
CO2: 26 mEq/L (ref 19–32)
Calcium: 9.5 mg/dL (ref 8.4–10.5)
Chloride: 104 mEq/L (ref 96–112)
Creatinine, Ser: 0.77 mg/dL (ref 0.40–1.20)
GFR: 73.68 mL/min (ref >60.00)
Glucose, Bld: 95 mg/dL (ref 70–99)
Potassium: 4.7 mEq/L (ref 3.5–5.1)
Sodium: 137 mEq/L (ref 135–145)

## 2019-05-28 MED ORDER — FLUTICASONE PROPIONATE 50 MCG/ACT NA SUSP: NASAL | 5 refills | Status: DC

## 2019-05-28 NOTE — Progress Notes (Signed)
   Subjective:    Patient ID: Stephanie Olsen, female    DOB: 1945/08/02, 74 y.o.   MRN: [id number]  HPI Here to follow up on issues. She feels well. She is approaching her 5 year anniversary of her breast cancer treatment, and she is excited about this. She goes to her gym to work out several days a week. Her anxiety is stable.    Review of Systems  Constitutional: Negative.   HENT: Negative.   Eyes: Negative.   Respiratory: Negative.   Cardiovascular: Negative.   Gastrointestinal: Negative.   Genitourinary: Negative for decreased urine volume, difficulty urinating, dyspareunia, dysuria, enuresis, flank pain, frequency, hematuria, pelvic pain and urgency.  Musculoskeletal: Negative.   Skin: Negative.   Neurological: Negative.   Psychiatric/Behavioral: Negative.        Objective:   Physical Exam Constitutional:      General: She is not in acute distress.    Appearance: She is well-developed.  HENT:     Head: Normocephalic and atraumatic.     Right Ear: External ear normal.     Left Ear: External ear normal.     Nose: Nose normal.     Mouth/Throat:     Pharynx: No oropharyngeal exudate.  Eyes:     General: No scleral icterus.    Conjunctiva/sclera: Conjunctivae normal.     Pupils: Pupils are equal, round, and reactive to light.  Neck:     Thyroid: No thyromegaly.     Vascular: No JVD.  Cardiovascular:     Rate and Rhythm: Normal rate and regular rhythm.     Heart sounds: Normal heart sounds. No murmur. No friction rub. No gallop.   Pulmonary:     Effort: Pulmonary effort is normal. No respiratory distress.     Breath sounds: Normal breath sounds. No wheezing or rales.  Chest:     Chest wall: No tenderness.  Abdominal:     General: Bowel sounds are normal. There is no distension.     Palpations: Abdomen is soft. There is no mass.     Tenderness: There is no abdominal tenderness. There is no guarding or rebound.  Musculoskeletal:        General: No tenderness.  Normal range of motion.     Cervical back: Normal range of motion and neck supple.  Lymphadenopathy:     Cervical: No cervical adenopathy.  Skin:    General: Skin is warm and dry.     Findings: No erythema or rash.  Neurological:     Mental Status: She is alert and oriented to person, place, and time.     Cranial Nerves: No cranial nerve deficit.     Motor: No abnormal muscle tone.     Coordination: Coordination normal.     Deep Tendon Reflexes: Reflexes are normal and symmetric. Reflexes normal.  Psychiatric:        Behavior: Behavior normal.        Thought Content: Thought content normal.        Judgment: Judgment normal.           Assessment & Plan:  Her HTN and anxiety and allergies and cardiomyopathy are all stable. We will get fasting labs to check lipids, etc. After she completes her Covid vaccine series, we will plan on giving her a pneumoccocal 23 vaccine. Alysia Penna, MD

## 2019-06-11 ENCOUNTER — Ambulatory Visit: Payer: Medicare Other | Attending: Internal Medicine

## 2019-06-11 DIAGNOSIS — Z23 Encounter for immunization: Secondary | ICD-10-CM

## 2019-06-11 NOTE — Progress Notes (Signed)
   Covid-19 Vaccination Clinic  Name:  Stephanie Olsen    MRN: [id number] DOB: 1945-11-12  06/11/2019  Stephanie Olsen was observed post Covid-19 immunization for 15 minutes without incidence. She was provided with Vaccine Information Sheet and instruction to access the V-Safe system.   Stephanie Olsen was instructed to call 911 with any severe reactions post vaccine: Marland Kitchen Difficulty breathing  . Swelling of your face and throat  . A fast heartbeat  . A bad rash all over your body  . Dizziness and weakness    Immunizations Administered    Name Date Dose VIS Date Route   Pfizer COVID-19 Vaccine 06/11/2019  3:39 PM 0.3 mL 04/11/2019 Intramuscular   Manufacturer: [place]   Lot: [id number]   [place]: [id number]

## 2019-06-17 ENCOUNTER — Ambulatory Visit: Payer: Medicare Other

## 2019-07-02 ENCOUNTER — Ambulatory Visit: Payer: Medicare Other

## 2019-07-29 ENCOUNTER — Other Ambulatory Visit: Payer: Self-pay

## 2019-07-30 ENCOUNTER — Ambulatory Visit (INDEPENDENT_AMBULATORY_CARE_PROVIDER_SITE_OTHER): Payer: Medicare Other

## 2019-07-30 DIAGNOSIS — Z23 Encounter for immunization: Secondary | ICD-10-CM

## 2019-08-05 DIAGNOSIS — H04123 Dry eye syndrome of bilateral lacrimal glands: Secondary | ICD-10-CM | POA: Diagnosis not present

## 2019-08-05 DIAGNOSIS — H353132 Nonexudative age-related macular degeneration, bilateral, intermediate dry stage: Secondary | ICD-10-CM | POA: Diagnosis not present

## 2019-08-05 DIAGNOSIS — H401132 Primary open-angle glaucoma, bilateral, moderate stage: Secondary | ICD-10-CM | POA: Diagnosis not present

## 2019-10-13 ENCOUNTER — Telehealth: Payer: Self-pay | Admitting: Family Medicine

## 2019-10-13 NOTE — Telephone Encounter (Signed)
Pt's grand daughter who is 72 years old has been diagnosed with bacterial pneumonia this Saturday.  Pt would like to know if she can be around her granddaughter since she has been cancer free for 5 years.  Pt was transferred to Triage Nurse to ask this question but looks like they are having phone issues and we can't transfer any pts. Thanks

## 2019-10-14 NOTE — Telephone Encounter (Signed)
Yes she can visit her granddaughter, but I would suggest she wear a mask

## 2019-10-14 NOTE — Telephone Encounter (Signed)
Spoke with the patient. She is aware that she can be around her granddaughter with a mask.

## 2019-11-04 NOTE — Progress Notes (Signed)
Patient Care Team: Laurey Morale, MD as PCP - Haskell Riling, MD (Inactive) as Consulting Physician (General Surgery) Nicholas Lose, MD as Consulting Physician (Hematology and Oncology) Arloa Koh, MD (Inactive) as Consulting Physician (Radiation Oncology) Holley Bouche, NP (Inactive) as Nurse Practitioner (Nurse Practitioner) Sylvan Cheese, NP as Nurse Practitioner (Hematology and Oncology)  DIAGNOSIS:    ICD-10-CM   1. Fatigue, unspecified type  R53.83   2. Malignant neoplasm of upper-outer quadrant of right breast in female, estrogen receptor negative ([place])  C50.411    Z17.1   3. Heart failure as complication of care ([place])  I50.9 ECHOCARDIOGRAM COMPLETE    SUMMARY OF ONCOLOGIC HISTORY: Oncology History  Breast cancer of upper-outer quadrant of right female breast ([place])  03/18/2014 Mammogram   Right breast: suspicious mass 10:00 position measuring 4.9 x 3 x 5 cm, 2 lower right axillary lymph nodes mildly thickened cortices   03/23/2014 Initial Biopsy   Right breast needle biopsy 9:00: Invasive ductal carcinoma grade 3, ER- (0%), PR- (0%), HER-2 negative (ratio 1.3), Ki67 95%   03/31/2014 Breast MRI   Right breast mass 9:00 upper outer quadrant with contiguous skin involvement by 5.7 cm, right axillary lymph nodes normal in size   04/01/2014 Clinical Stage   Stage IIB: T2 N0   04/10/2014 - 08/26/2014 Neo-Adjuvant Chemotherapy   Dose dense doxorubicin and cyclophosphamide 4 cycles followed by weekly paclitaxel 12. After week 1, paclitaxel changed to nab-paclitaxel    08/28/2014 Breast MRI   Decrease in the size of the right breast mass from 5.7 cm to 8 mm, skin enhancement is no longer seen, decreased in size of axillary lymph nodes, excellent response to chemotherapy   09/29/2014 Definitive Surgery   Right Lumpectomy / SLNB ([facility]): pathologic CR, 2 LN removed and negative for malignancy (0/2 LN)   11/11/2014 - 12/18/2014 Radiation Therapy     Adjuvant RT [facility]): Right breast 50.4 Gy over 28 fractions   02/24/2015 Survivorship   Survivorship care plan completed and mailed to patient in lieu of in person visit.     CHIEF COMPLIANT: Surveillance of right breast cancer  INTERVAL HISTORY: Stephanie Olsen is a 74 y.o. with above-mentioned history of right breast cancer treated with neoadjuvant chemotherapy, lumpectomy, radiation and is currently on surveillance. Mammogram on 05/26/19 showed no evidence of malignancy bilaterally. She presents to the clinic today for annual follow-up. Her major complaint is fatigue with exertion.  She had extensive work-up including a thyroid check which was normal.  ALLERGIES:  is allergic to fosamax [alendronate sodium].  MEDICATIONS:  Current Outpatient Medications  Medication Sig Dispense Refill  . Ascorbic Acid (VITAMIN C) 500 MG tablet Take 500 mg by mouth 2 (two) times daily.     Marland Kitchen aspirin EC 81 MG tablet Take 81 mg by mouth daily.    . Calcium Carb-Cholecalciferol (CALCIUM 600/VITAMIN D3) 600-800 MG-UNIT TABS Take 1 tablet by mouth 2 (two) times daily.     . cetirizine (ZYRTEC) 10 MG tablet Take 10 mg by mouth daily as needed (seasonal allergies).     . clonazePAM (KLONOPIN) 2 MG tablet Take 1 tablet (2 mg total) by mouth 2 (two) times daily as needed for anxiety. 180 tablet 1  . cyclobenzaprine (FLEXERIL) 10 MG tablet TAKE 1 TABLET BY MOUTH THREE TIMES DAILY AS NEEDED FOR MUSCLE SPASMS 90 tablet 5  . dorzolamide-timolol (COSOPT) 22.3-6.8 MG/ML ophthalmic solution Place 1 drop into both eyes 2 (two) times daily. 10 mL   .  fish oil-omega-3 fatty acids 1000 MG capsule Take 1 g by mouth at bedtime.     . fluticasone (FLONASE) 50 MCG/ACT nasal spray 2 puffs daily 48 g 5  . latanoprost (XALATAN) 0.005 % ophthalmic solution Place 1 drop into both eyes at bedtime.    . Multiple Vitamins-Minerals (PRESERVISION AREDS 2 PO) Take 1 capsule by mouth 2 (two) times daily.      No current  facility-administered medications for this visit.    PHYSICAL EXAMINATION: ECOG PERFORMANCE STATUS: 1 - Symptomatic but completely ambulatory  Vitals:   11/05/19 1050  BP: 138/75  Pulse: 73  Resp: 17  Temp: 98.5 F (36.9 C)  SpO2: 99%   Filed Weights   11/05/19 1050  Weight: 191 lb 1.6 oz (86.7 kg)    BREAST: No palpable masses or nodules in either right or left breasts. No palpable axillary supraclavicular or infraclavicular adenopathy no breast tenderness or nipple discharge. (exam performed in the presence of a chaperone)  LABORATORY DATA:  I have reviewed the data as listed CMP Latest Ref Rng & Units 05/28/2019 09/25/2017 07/12/2016  Glucose 70 - 99 mg/dL 95 97 95  BUN 6 - 23 mg/dL '12 12 13  ' Creatinine 0.40 - 1.20 mg/dL 0.77 0.64 0.74  Sodium 135 - 145 mEq/L 137 136 139  Potassium 3.5 - 5.1 mEq/L 4.7 3.9 4.4  Chloride 96 - 112 mEq/L 104 101 103  CO2 19 - 32 mEq/L '26 27 28  ' Calcium 8.4 - 10.5 mg/dL 9.5 9.5 10.0  Total Protein 6.0 - 8.3 g/dL 6.8 6.9 7.0  Total Bilirubin 0.2 - 1.2 mg/dL 0.9 0.6 0.7  Alkaline Phos 39 - 117 U/L 70 55 64  AST 0 - 37 U/L '26 19 17  ' ALT 0 - 35 U/L '29 17 13    ' Lab Results  Component Value Date   WBC 5.1 05/28/2019   HGB 14.5 05/28/2019   HCT 43.4 05/28/2019   MCV 90.5 05/28/2019   PLT 243.0 05/28/2019   NEUTROABS 2.9 05/28/2019    ASSESSMENT & PLAN:  Breast cancer of upper-outer quadrant of right female breast Right breast invasive ductal carcinoma, palpable mass, 5 cm by mammogram and 5.7 cm by MRI grade 3, ER PR HER-2 negative, associated skin dimpling, T3, N0, M0 stage IIB Ki-67 90%  Chemotherapy summary: Neoadjuvant chemotherapy with dose dense Adriamycin and Cytoxan 04/10/2014. followed by weekly Taxol x1 changed to Abraxane 11 from 06/17/2014 completed 08/26/2014 Rt Lumpectomy: 09/29/14: Path CR 0/2 LN Completed adjuvant radiation therapy 12/18/2014  Breast Cancer Surveillance: 1. Breast exam 11/05/2019: right breast upper  quadrant feels tight in the muscle. No palpable nodularity. 2. Mammograms  05/26/2019: Benign , breast density category B 3. Bone density 05/14/2015: T score -1.3 osteopenia (scheduled in August 2021)  Fatigue: Patient exercises regularly but she feels very tired most often. I recommended that she get an echocardiogram We will call her with the result of this test.  She continues to stay active and helps take care of her 72 yr old granddaughter.  RTC in 1 yr for continued surveillance    Orders Placed This Encounter  Procedures  . ECHOCARDIOGRAM COMPLETE    Standing Status:   Future    Standing Expiration Date:   11/04/2020    Order Specific Question:   Where should this test be performed    Answer:   [facility]    Order Specific Question:   Perflutren DEFINITY (image enhancing agent) should be administered unless hypersensitivity or  allergy exist    Answer:   Administer Perflutren    Order Specific Question:   Reason for exam-Echo    Answer:   Chemo  V67.2 / Z09    Order Specific Question:   Release to patient    Answer:   Immediate   The patient has a good understanding of the overall plan. she agrees with it. she will call with any problems that may develop before the next visit here.  Total time spent: 20 mins including face to face time and time spent for planning, charting and coordination of care  Nicholas Lose, MD 11/05/2019  I, Cloyde Reams Dorshimer, am acting as scribe for Dr. Nicholas Lose.  I have reviewed the above documentation for accuracy and completeness, and I agree with the above.

## 2019-11-05 ENCOUNTER — Other Ambulatory Visit: Payer: Self-pay

## 2019-11-05 ENCOUNTER — Telehealth: Payer: Self-pay | Admitting: Hematology and Oncology

## 2019-11-05 ENCOUNTER — Inpatient Hospital Stay: Payer: Medicare Other | Attending: Hematology and Oncology | Admitting: Hematology and Oncology

## 2019-11-05 VITALS — BP 138/75 | HR 73 | Temp 98.5°F | Resp 17 | Ht 65.0 in | Wt 191.1 lb

## 2019-11-05 DIAGNOSIS — Z923 Personal history of irradiation: Secondary | ICD-10-CM | POA: Insufficient documentation

## 2019-11-05 DIAGNOSIS — I509 Heart failure, unspecified: Secondary | ICD-10-CM | POA: Diagnosis not present

## 2019-11-05 DIAGNOSIS — Z9221 Personal history of antineoplastic chemotherapy: Secondary | ICD-10-CM | POA: Diagnosis not present

## 2019-11-05 DIAGNOSIS — Z171 Estrogen receptor negative status [ER-]: Secondary | ICD-10-CM | POA: Insufficient documentation

## 2019-11-05 DIAGNOSIS — R5383 Other fatigue: Secondary | ICD-10-CM | POA: Diagnosis not present

## 2019-11-05 DIAGNOSIS — Z7982 Long term (current) use of aspirin: Secondary | ICD-10-CM | POA: Diagnosis not present

## 2019-11-05 DIAGNOSIS — C50411 Malignant neoplasm of upper-outer quadrant of right female breast: Secondary | ICD-10-CM | POA: Diagnosis not present

## 2019-11-05 DIAGNOSIS — Z79899 Other long term (current) drug therapy: Secondary | ICD-10-CM | POA: Diagnosis not present

## 2019-11-05 MED ORDER — DORZOLAMIDE HCL-TIMOLOL MAL 2-0.5 % OP SOLN: 1.0000 [drp] | Freq: Two times a day (BID) | OPHTHALMIC | Status: AC

## 2019-11-05 NOTE — Telephone Encounter (Signed)
Scheduled appts per 7/7 los. Gave pt a print out of AVS.

## 2019-11-05 NOTE — Assessment & Plan Note (Signed)
Right breast invasive ductal carcinoma, palpable mass, 5 cm by mammogram and 5.7 cm by MRI grade 3, ER PR HER-2 negative, associated skin dimpling, T3, N0, M0 stage IIB Ki-67 90%  Chemotherapy summary: Neoadjuvant chemotherapy with dose dense Adriamycin and Cytoxan 04/10/2014. followed by weekly Taxol x1 changed to Abraxane 11 from 06/17/2014 completed 08/26/2014 Rt Lumpectomy: 09/29/14: Path CR 0/2 LN Completed adjuvant radiation therapy 12/18/2014  Breast Cancer Surveillance: 1. Breast exam 11/05/2019: right breast upper quadrant feels tight in the muscle. No palpable nodularity. 2. Mammograms  05/26/2019: Benign , breast density category B 3. Bone density 05/14/2015: T score -1.3 osteopenia (scheduled in August 2021)  Cardiomyopathy:follows with cardiology She continues to stay active and helps take care of her 79 yr old granddaughter as well as her 56 year old aunt.  RTC in 1 yr for continued surveillance

## 2019-11-13 DIAGNOSIS — Z124 Encounter for screening for malignant neoplasm of cervix: Secondary | ICD-10-CM | POA: Diagnosis not present

## 2019-11-13 DIAGNOSIS — N9089 Other specified noninflammatory disorders of vulva and perineum: Secondary | ICD-10-CM | POA: Diagnosis not present

## 2019-11-14 ENCOUNTER — Ambulatory Visit (HOSPITAL_COMMUNITY)
Admission: RE | Admit: 2019-11-14 | Discharge: 2019-11-14 | Disposition: A | Discharge status: Home / Self Care | Payer: Medicare Other | Source: Ambulatory Visit | Attending: Hematology and Oncology | Admitting: Hematology and Oncology

## 2019-11-14 ENCOUNTER — Other Ambulatory Visit: Payer: Self-pay

## 2019-11-14 DIAGNOSIS — I509 Heart failure, unspecified: Secondary | ICD-10-CM | POA: Insufficient documentation

## 2019-11-14 DIAGNOSIS — I110 Hypertensive heart disease with heart failure: Secondary | ICD-10-CM | POA: Insufficient documentation

## 2019-11-14 LAB — ECHOCARDIOGRAM COMPLETE
Area-P 1/2: 4.29 cm2
S' Lateral: 2.6 cm

## 2019-11-14 NOTE — Progress Notes (Signed)
  Echocardiogram 2D Echocardiogram has been performed.  Jennette Dubin 11/14/2019, 11:26 AM

## 2019-12-08 DIAGNOSIS — H401132 Primary open-angle glaucoma, bilateral, moderate stage: Secondary | ICD-10-CM | POA: Diagnosis not present

## 2019-12-09 ENCOUNTER — Other Ambulatory Visit: Payer: Self-pay

## 2019-12-09 ENCOUNTER — Ambulatory Visit
Admission: RE | Admit: 2019-12-09 | Discharge: 2019-12-09 | Disposition: A | Discharge status: Home / Self Care | Payer: Medicare Other | Source: Ambulatory Visit | Attending: Family Medicine | Admitting: Family Medicine

## 2019-12-09 DIAGNOSIS — Z780 Asymptomatic menopausal state: Secondary | ICD-10-CM | POA: Diagnosis not present

## 2019-12-09 DIAGNOSIS — M85851 Other specified disorders of bone density and structure, right thigh: Secondary | ICD-10-CM | POA: Diagnosis not present

## 2019-12-09 DIAGNOSIS — Z13820 Encounter for screening for osteoporosis: Secondary | ICD-10-CM

## 2020-02-10 DIAGNOSIS — Z23 Encounter for immunization: Secondary | ICD-10-CM | POA: Diagnosis not present

## 2020-03-04 ENCOUNTER — Other Ambulatory Visit: Payer: Self-pay | Admitting: Family Medicine

## 2020-03-05 NOTE — Telephone Encounter (Signed)
LAST ov 07-30-19 LAST REFILL--1 YEAR AGO  Please advise

## 2020-03-18 DIAGNOSIS — Z23 Encounter for immunization: Secondary | ICD-10-CM | POA: Diagnosis not present

## 2020-04-06 DIAGNOSIS — H04123 Dry eye syndrome of bilateral lacrimal glands: Secondary | ICD-10-CM | POA: Diagnosis not present

## 2020-04-06 DIAGNOSIS — H401132 Primary open-angle glaucoma, bilateral, moderate stage: Secondary | ICD-10-CM | POA: Diagnosis not present

## 2020-04-06 DIAGNOSIS — H524 Presbyopia: Secondary | ICD-10-CM | POA: Diagnosis not present

## 2020-04-06 DIAGNOSIS — H353132 Nonexudative age-related macular degeneration, bilateral, intermediate dry stage: Secondary | ICD-10-CM | POA: Diagnosis not present

## 2020-05-04 ENCOUNTER — Other Ambulatory Visit: Payer: Self-pay | Admitting: Hematology and Oncology

## 2020-05-04 DIAGNOSIS — Z9889 Other specified postprocedural states: Secondary | ICD-10-CM

## 2020-05-07 ENCOUNTER — Other Ambulatory Visit: Payer: Self-pay

## 2020-05-07 ENCOUNTER — Ambulatory Visit (INDEPENDENT_AMBULATORY_CARE_PROVIDER_SITE_OTHER): Payer: Medicare Other

## 2020-05-07 DIAGNOSIS — Z0000 Encounter for general adult medical examination without abnormal findings: Secondary | ICD-10-CM | POA: Diagnosis not present

## 2020-05-07 NOTE — Patient Instructions (Signed)
Stephanie Olsen , Thank you for taking time to come for your Medicare Wellness Visit. I appreciate your ongoing commitment to your health goals. Please review the following plan we discussed and let me know if I can assist you in the future.   Screening recommendations/referrals: Colonoscopy: Up to date, next due 10/04/2026 Mammogram: Up to date, next due 73/25/2022 Bone Density: UP to date, next due 12/08/2024 Recommended yearly ophthalmology/optometry visit for glaucoma screening and checkup Recommended yearly dental visit for hygiene and checkup  Vaccinations: Influenza vaccine: Up to date, next due fall 75  Pneumococcal vaccine: Up to date, next due 73/31/2022 Tdap vaccine: Up to date next due 10/31/2025 Shingles vaccine: Currently due for Shingirx, if you wish to receive we recommend that you do so at your local pharmacy.    Advanced directives: Please bring copies of your advanced medical directives so that we may scan them into your chart.   Conditions/risks identified: None   Next appointment: 05/29/71 @ 10:00 am with Dr. Sarajane Jews   Preventive Care 73 Years and Older, Female Preventive care refers to lifestyle choices and visits with your health care provider that can promote health and wellness. What does preventive care include?  A yearly physical exam. This is also called an annual well check.  Dental exams once or twice a year.  Routine eye exams. Ask your health care provider how often you should have your eyes checked.  Personal lifestyle choices, including:  Daily care of your teeth and gums.  Regular physical activity.  Eating a healthy diet.  Avoiding tobacco and drug use.  Limiting alcohol use.  Practicing safe sex.  Taking low-dose aspirin every day.  Taking vitamin and mineral supplements as recommended by your health care provider. What happens during an annual well check? The services and screenings done by your health care provider during your  annual well check will depend on your age, overall health, lifestyle risk factors, and family history of disease. Counseling  Your health care provider may ask you questions about your:  Alcohol use.  Tobacco use.  Drug use.  Emotional well-being.  Home and relationship well-being.  Sexual activity.  Eating habits.  History of falls.  Memory and ability to understand (cognition).  Work and work Statistician.  Reproductive health. Screening  You may have the following tests or measurements:  Height, weight, and BMI.  Blood pressure.  Lipid and cholesterol levels. These may be checked every 5 years, or more frequently if you are over 63 years old.  Skin check.  Lung cancer screening. You may have this screening every year starting at age 73 if you have a 30-pack-year history of smoking and currently smoke or have quit within the past 15 years.  Fecal occult blood test (FOBT) of the stool. You may have this test every year starting at age 73.  Flexible sigmoidoscopy or colonoscopy. You may have a sigmoidoscopy every 5 years or a colonoscopy every 10 years starting at age 73.  Hepatitis C blood test.  Hepatitis B blood test.  Sexually transmitted disease (STD) testing.  Diabetes screening. This is done by checking your blood sugar (glucose) after you have not eaten for a while (fasting). You may have this done every 1-3 years.  Bone density scan. This is done to screen for osteoporosis. You may have this done starting at age 73.  Mammogram. This may be done every 1-2 years. Talk to your health care provider about how often you should have regular mammograms. Talk  with your health care provider about your test results, treatment options, and if necessary, the need for more tests. Vaccines  Your health care provider may recommend certain vaccines, such as:  Influenza vaccine. This is recommended every year.  Tetanus, diphtheria, and acellular pertussis (Tdap, Td)  vaccine. You may need a Td booster every 10 years.  Zoster vaccine. You may need this after age 21.  Pneumococcal 13-valent conjugate (PCV13) vaccine. One dose is recommended after age 44.  Pneumococcal polysaccharide (PPSV23) vaccine. One dose is recommended after age 27. Talk to your health care provider about which screenings and vaccines you need and how often you need them. This information is not intended to replace advice given to you by your health care provider. Make sure you discuss any questions you have with your health care provider. Document Released: 05/14/2015 Document Revised: 01/05/2016 Document Reviewed: 02/16/2015 Elsevier Interactive Patient Education  2017 [place] Prevention in the Home Falls can cause injuries. They can happen to people of all ages. There are many things you can do to make your home safe and to help prevent falls. What can I do on the outside of my home?  Regularly fix the edges of walkways and driveways and fix any cracks.  Remove anything that might make you trip as you walk through a door, such as a raised step or threshold.  Trim any bushes or trees on the path to your home.  Use bright outdoor lighting.  Clear any walking paths of anything that might make someone trip, such as rocks or tools.  Regularly check to see if handrails are loose or broken. Make sure that both sides of any steps have handrails.  Any raised decks and porches should have guardrails on the edges.  Have any leaves, snow, or ice cleared regularly.  Use sand or salt on walking paths during winter.  Clean up any spills in your garage right away. This includes oil or grease spills. What can I do in the bathroom?  Use night lights.  Install grab bars by the toilet and in the tub and shower. Do not use towel bars as grab bars.  Use non-skid mats or decals in the tub or shower.  If you need to sit down in the shower, use a plastic, non-slip  stool.  Keep the floor dry. Clean up any water that spills on the floor as soon as it happens.  Remove soap buildup in the tub or shower regularly.  Attach bath mats securely with double-sided non-slip rug tape.  Do not have throw rugs and other things on the floor that can make you trip. What can I do in the bedroom?  Use night lights.  Make sure that you have a light by your bed that is easy to reach.  Do not use any sheets or blankets that are too big for your bed. They should not hang down onto the floor.  Have a firm chair that has side arms. You can use this for support while you get dressed.  Do not have throw rugs and other things on the floor that can make you trip. What can I do in the kitchen?  Clean up any spills right away.  Avoid walking on wet floors.  Keep items that you use a lot in easy-to-reach places.  If you need to reach something above you, use a strong step stool that has a grab bar.  Keep electrical cords out of the way.  Do  not use floor polish or wax that makes floors slippery. If you must use wax, use non-skid floor wax.  Do not have throw rugs and other things on the floor that can make you trip. What can I do with my stairs?  Do not leave any items on the stairs.  Make sure that there are handrails on both sides of the stairs and use them. Fix handrails that are broken or loose. Make sure that handrails are as long as the stairways.  Check any carpeting to make sure that it is firmly attached to the stairs. Fix any carpet that is loose or worn.  Avoid having throw rugs at the top or bottom of the stairs. If you do have throw rugs, attach them to the floor with carpet tape.  Make sure that you have a light switch at the top of the stairs and the bottom of the stairs. If you do not have them, ask someone to add them for you. What else can I do to help prevent falls?  Wear shoes that:  Do not have high heels.  Have rubber bottoms.  Are  comfortable and fit you well.  Are closed at the toe. Do not wear sandals.  If you use a stepladder:  Make sure that it is fully opened. Do not climb a closed stepladder.  Make sure that both sides of the stepladder are locked into place.  Ask someone to hold it for you, if possible.  Clearly mark and make sure that you can see:  Any grab bars or handrails.  First and last steps.  Where the edge of each step is.  Use tools that help you move around (mobility aids) if they are needed. These include:  Canes.  Walkers.  Scooters.  Crutches.  Turn on the lights when you go into a dark area. Replace any light bulbs as soon as they burn out.  Set up your furniture so you have a clear path. Avoid moving your furniture around.  If any of your floors are uneven, fix them.  If there are any pets around you, be aware of where they are.  Review your medicines with your doctor. Some medicines can make you feel dizzy. This can increase your chance of falling. Ask your doctor what other things that you can do to help prevent falls. This information is not intended to replace advice given to you by your health care provider. Make sure you discuss any questions you have with your health care provider. Document Released: 02/11/2009 Document Revised: 09/23/2015 Document Reviewed: 05/22/2014 Elsevier Interactive Patient Education  2017 [employer].
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
## 2020-05-27 ENCOUNTER — Other Ambulatory Visit: Payer: Self-pay

## 2020-05-28 ENCOUNTER — Ambulatory Visit (INDEPENDENT_AMBULATORY_CARE_PROVIDER_SITE_OTHER): Payer: Medicare Other | Admitting: Family Medicine

## 2020-05-28 ENCOUNTER — Encounter: Payer: Self-pay | Admitting: Family Medicine

## 2020-05-28 VITALS — BP 160/70 | HR 52 | Temp 98.4°F | Resp 20 | Wt 185.2 lb

## 2020-05-28 DIAGNOSIS — I10 Essential (primary) hypertension: Secondary | ICD-10-CM | POA: Diagnosis not present

## 2020-05-28 DIAGNOSIS — N3281 Overactive bladder: Secondary | ICD-10-CM

## 2020-05-28 DIAGNOSIS — E785 Hyperlipidemia, unspecified: Secondary | ICD-10-CM

## 2020-05-28 LAB — HEPATIC FUNCTION PANEL
ALT: 19 U/L (ref 0–35)
AST: 25 U/L (ref 0–37)
Albumin: 4.3 g/dL (ref 3.5–5.2)
Alkaline Phosphatase: 71 U/L (ref 39–117)
Bilirubin, Direct: 0.1 mg/dL (ref 0.0–0.3)
Total Bilirubin: 0.8 mg/dL (ref 0.2–1.2)
Total Protein: 7.7 g/dL (ref 6.0–8.3)

## 2020-05-28 LAB — CBC WITH DIFFERENTIAL/PLATELET
Basophils Absolute: 0 10*3/uL (ref 0.0–0.1)
Basophils Relative: 0.7 % (ref 0.0–3.0)
Eosinophils Absolute: 0.3 10*3/uL (ref 0.0–0.7)
Eosinophils Relative: 6.1 % — ABNORMAL HIGH (ref 0.0–5.0)
HCT: 43.2 % (ref 36.0–46.0)
Hemoglobin: 14.6 g/dL (ref 12.0–15.0)
Lymphocytes Relative: 33.4 % (ref 12.0–46.0)
Lymphs Abs: 1.8 10*3/uL (ref 0.7–4.0)
MCHC: 33.8 g/dL (ref 30.0–36.0)
MCV: 89.1 fl (ref 78.0–100.0)
Monocytes Absolute: 0.5 10*3/uL (ref 0.1–1.0)
Monocytes Relative: 8.7 % (ref 3.0–12.0)
Neutro Abs: 2.7 10*3/uL (ref 1.4–7.7)
Neutrophils Relative %: 51.1 % (ref 43.0–77.0)
Platelets: 247 10*3/uL (ref 150.0–400.0)
RBC: 4.85 Mil/uL (ref 3.87–5.11)
RDW: 13.8 % (ref 11.5–15.5)
WBC: 5.3 10*3/uL (ref 4.0–10.5)

## 2020-05-28 LAB — BASIC METABOLIC PANEL
BUN: 10 mg/dL (ref 6–23)
CO2: 27 mEq/L (ref 19–32)
Calcium: 10.3 mg/dL (ref 8.4–10.5)
Chloride: 101 mEq/L (ref 96–112)
Creatinine, Ser: 0.68 mg/dL (ref 0.40–1.20)
GFR: 86.62 mL/min (ref >60.00)
Glucose, Bld: 81 mg/dL (ref 70–99)
Potassium: 4.6 mEq/L (ref 3.5–5.1)
Sodium: 135 mEq/L (ref 135–145)

## 2020-05-28 LAB — LIPID PANEL
Cholesterol: 224 mg/dL — ABNORMAL HIGH (ref 0–200)
HDL: 75.5 mg/dL (ref >39.00)
LDL Cholesterol: 129 mg/dL — ABNORMAL HIGH (ref 0–99)
NonHDL: 148.54
Total CHOL/HDL Ratio: 3
Triglycerides: 97 mg/dL (ref 0.0–149.0)
VLDL: 19.4 mg/dL (ref 0.0–40.0)

## 2020-05-28 LAB — TSH: TSH: 2.04 u[IU]/mL (ref 0.35–4.50)

## 2020-05-28 MED ORDER — SOLIFENACIN SUCCINATE 5 MG PO TABS: 5.0000 mg | ORAL_TABLET | Freq: Every day | ORAL | 2 refills | Status: DC

## 2020-05-28 NOTE — Addendum Note (Signed)
Addended by: Marrion Coy on: 05/28/2020 12:09 PM   Modules accepted: Orders

## 2020-05-28 NOTE — Progress Notes (Signed)
   Subjective:    Patient ID: Stephanie Olsen, female    DOB: 04-09-1946, 75 y.o.   MRN: [id number]  HPI Here to ask about urine incontinence and urgency. She often leaks a little urine before she can make it to the bathroom. No burning. No leakage in bed at night. Her BP has been stable at home. She is fasting for labs.  Review of Systems  Constitutional: Negative.   Respiratory: Negative.   Cardiovascular: Negative.        Objective:   Physical Exam Constitutional:      Appearance: Normal appearance.  Cardiovascular:     Rate and Rhythm: Normal rate and regular rhythm.     Pulses: Normal pulses.     Heart sounds: Normal heart sounds.  Pulmonary:     Effort: Pulmonary effort is normal.     Breath sounds: Normal breath sounds.  Neurological:     Mental Status: She is alert.           Assessment & Plan:  OAB, she will try Vesicare 5 mg daily. The HTN is stable. Get fasting labs for lipids, etc.  Alysia Penna, MD

## 2020-06-07 ENCOUNTER — Other Ambulatory Visit: Payer: Self-pay

## 2020-06-07 ENCOUNTER — Ambulatory Visit (INDEPENDENT_AMBULATORY_CARE_PROVIDER_SITE_OTHER): Payer: Medicare Other | Admitting: Family Medicine

## 2020-06-07 ENCOUNTER — Encounter: Payer: Self-pay | Admitting: Family Medicine

## 2020-06-07 VITALS — BP 120/72 | HR 76 | Temp 98.3°F | Ht 65.0 in | Wt 187.0 lb

## 2020-06-07 DIAGNOSIS — I10 Essential (primary) hypertension: Secondary | ICD-10-CM

## 2020-06-07 DIAGNOSIS — Z171 Estrogen receptor negative status [ER-]: Secondary | ICD-10-CM

## 2020-06-07 DIAGNOSIS — E785 Hyperlipidemia, unspecified: Secondary | ICD-10-CM

## 2020-06-07 DIAGNOSIS — F411 Generalized anxiety disorder: Secondary | ICD-10-CM | POA: Diagnosis not present

## 2020-06-07 DIAGNOSIS — N3281 Overactive bladder: Secondary | ICD-10-CM

## 2020-06-07 DIAGNOSIS — C50411 Malignant neoplasm of upper-outer quadrant of right female breast: Secondary | ICD-10-CM | POA: Diagnosis not present

## 2020-06-07 DIAGNOSIS — I420 Dilated cardiomyopathy: Secondary | ICD-10-CM | POA: Diagnosis not present

## 2020-06-07 MED ORDER — FLUTICASONE PROPIONATE 50 MCG/ACT NA SUSP: NASAL | 3 refills | Status: DC

## 2020-06-07 MED ORDER — SOLIFENACIN SUCCINATE 5 MG PO TABS: 10.0000 mg | ORAL_TABLET | Freq: Every day | ORAL | 2 refills | Status: DC

## 2020-06-07 NOTE — Progress Notes (Signed)
Subjective:    Patient ID: Stephanie Olsen, female    DOB: July 14, 1945, 75 y.o.   MRN: [id number]  HPI Here to follow up on issues. For the past few weeks she has taken Vesicare 5 mg daily to help with OAB. This has mostly been successful, although she has had 3 episodes of urge incontinence. Her BP is stable. She sees Oncology regularly for her hx of breast cancer. Last July she had an ECHO which showed her EF is back up to 55-60%.    Review of Systems  Constitutional: Negative.   HENT: Negative.   Eyes: Negative.   Respiratory: Negative.   Cardiovascular: Negative.   Gastrointestinal: Negative.   Genitourinary: Negative for decreased urine volume, difficulty urinating, dyspareunia, dysuria, enuresis, flank pain, frequency, hematuria, pelvic pain and urgency.  Musculoskeletal: Negative.   Skin: Negative.   Neurological: Negative.   Psychiatric/Behavioral: Negative.        Objective:   Physical Exam Constitutional:      General: She is not in acute distress.    Appearance: Normal appearance. She is well-developed and well-nourished. She is not diaphoretic.  HENT:     Head: Normocephalic and atraumatic.     Right Ear: External ear normal.     Left Ear: External ear normal.     Nose: Nose normal.     Mouth/Throat:     Mouth: Oropharynx is clear and moist.     Pharynx: No oropharyngeal exudate.  Eyes:     General: No scleral icterus.       Right eye: No discharge.        Left eye: No discharge.     Extraocular Movements: EOM normal.     Conjunctiva/sclera: Conjunctivae normal.     Pupils: Pupils are equal, round, and reactive to light.  Neck:     Thyroid: No thyromegaly.     Vascular: No JVD.  Cardiovascular:     Rate and Rhythm: Normal rate and regular rhythm.     Pulses: Intact distal pulses.     Heart sounds: Normal heart sounds. No murmur heard. No friction rub. No gallop.   Pulmonary:     Effort: Pulmonary effort is normal. No respiratory distress.     Breath  sounds: Normal breath sounds. No stridor. No wheezing or rales.  Chest:     Chest wall: No tenderness.  Breasts: No discharge from either breast. No tenderness and bleeding.    Abdominal:     General: Bowel sounds are normal. There is no distension, ascites or abdominal bruit.     Palpations: Abdomen is soft. Abdomen is not rigid. There is no hepatosplenomegaly or mass.     Tenderness: There is no abdominal tenderness. There is no guarding or rebound.     Hernia: No hernia is present.  Genitourinary:    Vagina: No erythema, tenderness or bleeding.     Cervix: No cervical motion tenderness, discharge or friability.     Uterus: Normal.      Adnexa:        Right: No mass, tenderness or fullness.         Left: No mass, tenderness or fullness.    Musculoskeletal:        General: No tenderness or edema. Normal range of motion.     Cervical back: Normal range of motion and neck supple.  Lymphadenopathy:     Cervical: No cervical adenopathy.  Skin:    General: Skin is warm and dry.  Coloration: Skin is not pale.     Findings: No erythema or rash.  Neurological:     Mental Status: She is alert and oriented to person, place, and time.     Cranial Nerves: No cranial nerve deficit.     Motor: No abnormal muscle tone.     Coordination: Coordination normal.     Deep Tendon Reflexes: Reflexes are normal and symmetric. Reflexes normal.  Psychiatric:        Mood and Affect: Mood and affect normal.        Behavior: Behavior normal.        Thought Content: Thought content normal.        Judgment: Judgment normal.           Assessment & Plan:  She seems to be doing well. Her recent labs were acceptable. Her anxiety is stable. For the OAB, she will increase the Vesicare to 10 mg daily and then report back to [place] in 2 weeks.  Alysia Penna, MD

## 2020-06-09 ENCOUNTER — Other Ambulatory Visit: Payer: Self-pay

## 2020-06-09 ENCOUNTER — Ambulatory Visit
Admission: RE | Admit: 2020-06-09 | Discharge: 2020-06-09 | Disposition: A | Discharge status: Home / Self Care | Payer: Medicare Other | Source: Ambulatory Visit | Attending: Hematology and Oncology | Admitting: Hematology and Oncology

## 2020-06-09 ENCOUNTER — Other Ambulatory Visit: Payer: Self-pay | Admitting: Hematology and Oncology

## 2020-06-09 DIAGNOSIS — Z1231 Encounter for screening mammogram for malignant neoplasm of breast: Secondary | ICD-10-CM | POA: Diagnosis not present

## 2020-06-09 DIAGNOSIS — Z9889 Other specified postprocedural states: Secondary | ICD-10-CM

## 2020-06-11 ENCOUNTER — Encounter: Payer: Medicare Other | Admitting: Family Medicine

## 2020-07-09 ENCOUNTER — Telehealth: Payer: Self-pay | Admitting: Family Medicine

## 2020-07-09 NOTE — Telephone Encounter (Signed)
Pt is calling in to let Dr. Sarajane Jews know Rx solifenacin (VESICARE) 5 MG worked very well and would like to have a new script for 10 MG.  [facility] on [address]  Pt is wanting to remind Dr. Sarajane Jews that she had glaucoma and in the leaflet it stated something about glaucoma.

## 2020-07-12 NOTE — Telephone Encounter (Signed)
This can be a rare side effect. Tell her to ask her eye doctor about this before we prescribe any more and then let us know

## 2020-07-12 NOTE — Telephone Encounter (Signed)
Spoke with pt verbalized Dr Sarajane Jews advise, state that she will call our office back when she speaks with her Eye Dr

## 2020-07-13 NOTE — Telephone Encounter (Signed)
Patient called and stated that spoke to her eye doctor and he stated that taking the generic Vesicare will not cause any damage to pt's glaucoma and it is ok to take. CB is [phone]

## 2020-07-14 ENCOUNTER — Other Ambulatory Visit: Payer: Self-pay

## 2020-07-14 MED ORDER — SOLIFENACIN SUCCINATE 10 MG PO TABS
10.0000 mg | ORAL_TABLET | Freq: Every day | ORAL | 3 refills | Status: DC
Start: 2020-07-14 — End: 2021-06-30

## 2020-07-14 NOTE — Telephone Encounter (Signed)
Very good. Call in Vesicare 10 mg daily, #90 with 3 rf

## 2020-07-14 NOTE — Telephone Encounter (Signed)
Medication sent to pharmacy  

## 2020-09-03 ENCOUNTER — Other Ambulatory Visit: Payer: Self-pay | Admitting: Family Medicine

## 2020-09-07 ENCOUNTER — Other Ambulatory Visit: Payer: Self-pay

## 2020-09-07 ENCOUNTER — Telehealth: Payer: Self-pay | Admitting: Family Medicine

## 2020-09-07 NOTE — Telephone Encounter (Signed)
pt needs to know which dosage she is supposed to take  solifenacin (VESICARE) 10 MG tablet  solifenacin (VESICARE) 5 MG tablet Please call patient on her cell phone number

## 2020-09-07 NOTE — Telephone Encounter (Signed)
Spoke with pt regarding the changes on her Vesicare, verbalized understanding that Dr Sarajane Jews recommends to continue with Vesicare 10 mg since its working best for pt

## 2020-09-17 DIAGNOSIS — H04123 Dry eye syndrome of bilateral lacrimal glands: Secondary | ICD-10-CM | POA: Diagnosis not present

## 2020-09-17 DIAGNOSIS — H35373 Puckering of macula, bilateral: Secondary | ICD-10-CM | POA: Diagnosis not present

## 2020-09-17 DIAGNOSIS — H401132 Primary open-angle glaucoma, bilateral, moderate stage: Secondary | ICD-10-CM | POA: Diagnosis not present

## 2020-09-17 DIAGNOSIS — H353132 Nonexudative age-related macular degeneration, bilateral, intermediate dry stage: Secondary | ICD-10-CM | POA: Diagnosis not present

## 2020-10-19 ENCOUNTER — Encounter: Payer: Self-pay | Admitting: Family Medicine

## 2020-10-19 ENCOUNTER — Telehealth (INDEPENDENT_AMBULATORY_CARE_PROVIDER_SITE_OTHER): Payer: Medicare Other | Admitting: Family Medicine

## 2020-10-19 VITALS — Temp 98.5°F | Wt 187.0 lb

## 2020-10-19 DIAGNOSIS — J019 Acute sinusitis, unspecified: Secondary | ICD-10-CM

## 2020-10-19 DIAGNOSIS — Z20822 Contact with and (suspected) exposure to covid-19: Secondary | ICD-10-CM | POA: Diagnosis not present

## 2020-10-19 MED ORDER — AZITHROMYCIN 250 MG PO TABS: ORAL_TABLET | ORAL | 0 refills | Status: DC

## 2020-10-19 NOTE — Progress Notes (Signed)
Subjective:    Patient ID: Stephanie Olsen, female    DOB: Oct 16, 1945, 75 y.o.   MRN: [id number]  HPI Virtual Visit via Video Note  I connected with the patient on 10/19/20 at  2:00 PM EDT by a video enabled telemedicine application and verified that I am speaking with the correct person using two identifiers.  Location patient: home Location provider:work or home office Persons participating in the virtual visit: patient, provider  I discussed the limitations of evaluation and management by telemedicine and the availability of in person appointments. The patient expressed understanding and agreed to proceed.   HPI: Here for 5 days of stuffy head, PND, ST and a dry cough. No fever or body aches. No SOB or NVD. Drinking fluids and taking Mucinex.    ROS: See pertinent positives and negatives per HPI.  Past Medical History:  Diagnosis Date   Allergy    Anxiety    Breast cancer ([place]) 2016   Right Breast   Breast cancer of upper-outer quadrant of right female breast ([place]) 03/25/2014   CHF (congestive heart failure) ([place])    Chicken pox    Dilated cardiomyopathy ([place])    sees Dr. Haroldine Laws    Glaucoma    sees Dr. Sarita Haver    [id number](008.6)    Hypertension    Neuromuscular disorder [facility])    peripheral neuropathy from chemo   Osteopenia    Osteoporosis    pt states has ostopenia not osteoporosis    Personal history of chemotherapy    2015-2016   Personal history of radiation therapy    2016   Rosacea    S/P radiation therapy 11/11/2014 through 12/18/2014                                                      Right breast 5040 cGy in 28 sessions                          Past Surgical History:  Procedure Laterality Date   APPENDECTOMY     BREAST LUMPECTOMY Right 09/28/2014   Malignant   BREAST LUMPECTOMY WITH RADIOACTIVE SEED AND SENTINEL LYMPH NODE BIOPSY Right 09/29/2014   Procedure: RIGHT BREAST LUMPECTOMY WITH RADIOACTIVE SEED AND RIGHT AXILLARY SENTINEL  LYMPH NODE BIOPSY;  Surgeon: Excell Seltzer, MD;  Location: [place];  Service: General;  Laterality: Right;   cataract surgery on both eyes     12 years ago   COLONOSCOPY  06/2016   per Dr. Earlean Shawl, adenomatous polyps,  repeat in 5 yrs   EYE SURGERY     laser per right eye related to pressure relief; past cataract surgery bilat    laser of left eye     1 week ago   PORT-A-CATH REMOVAL Left 09/29/2014   Procedure: REMOVAL PORT-A-CATH;  Surgeon: Excell Seltzer, MD;  Location: [place];  Service: General;  Laterality: Left;   PORTACATH PLACEMENT N/A 04/07/2014   Procedure: INSERTION PORT-A-CATH;  Surgeon: Excell Seltzer, MD;  Location: [facility];  Service: General;  Laterality: N/A;    Family History  Problem Relation Age of Onset   Lung cancer Maternal Uncle        heavy smoker   Colon cancer Paternal Grandmother 50   Lung cancer Cousin  smoker   Leukemia Cousin 3   Arthritis Other    Coronary artery disease Other    Hypertension Other    Stroke Other    Macular degeneration Other      Current Outpatient Medications:    Ascorbic Acid (VITAMIN C) 500 MG tablet, Take 500 mg by mouth 2 (two) times daily., Disp: , Rfl:    Calcium Carb-Cholecalciferol 600-800 MG-UNIT TABS, Take 1 tablet by mouth 2 (two) times daily. , Disp: , Rfl:    cetirizine (ZYRTEC) 10 MG tablet, Take 10 mg by mouth daily as needed (seasonal allergies)., Disp: , Rfl:    clonazePAM (KLONOPIN) 2 MG tablet, TAKE 1 TABLET(2 MG) BY MOUTH TWICE DAILY AS NEEDED FOR ANXIETY, Disp: 180 tablet, Rfl: 1   cyclobenzaprine (FLEXERIL) 10 MG tablet, TAKE 1 TABLET BY MOUTH THREE TIMES DAILY AS NEEDED FOR MUSCLE SPASMS, Disp: 90 tablet, Rfl: 5   dorzolamide-timolol (COSOPT) 22.3-6.8 MG/ML ophthalmic solution, Place 1 drop into both eyes 2 (two) times daily., Disp: 10 mL, Rfl:    fish oil-omega-3 fatty acids 1000 MG capsule, Take 1 g by mouth at bedtime., Disp: , Rfl:    fluticasone  (FLONASE) 50 MCG/ACT nasal spray, 2 puffs daily, Disp: 48 g, Rfl: 3   latanoprost (XALATAN) 0.005 % ophthalmic solution, Place 1 drop into both eyes at bedtime., Disp: , Rfl:    Multiple Vitamins-Minerals (PRESERVISION AREDS 2 PO), Take 1 capsule by mouth 2 (two) times daily. , Disp: , Rfl:    solifenacin (VESICARE) 10 MG tablet, Take 1 tablet (10 mg total) by mouth daily., Disp: 90 tablet, Rfl: 3   azithromycin (ZITHROMAX Z-PAK) 250 MG tablet, As directed, Disp: 6 each, Rfl: 0  EXAM:  VITALS per patient if applicable:  GENERAL: alert, oriented, appears well and in no acute distress  HEENT: atraumatic, conjunttiva clear, no obvious abnormalities on inspection of external nose and ears  NECK: normal movements of the head and neck  LUNGS: on inspection no signs of respiratory distress, breathing rate appears normal, no obvious gross SOB, gasping or wheezing  CV: no obvious cyanosis  MS: moves all visible extremities without noticeable abnormality  PSYCH/NEURO: pleasant and cooperative, no obvious depression or anxiety, speech and thought processing grossly intact  ASSESSMENT AND PLAN: Sinusitis, treat with a Zpack. I also asked her to get tested for the Covid-19 virus and she agreed.  Stephanie Penna, MD  Discussed the following assessment and plan:  No diagnosis found.     I discussed the assessment and treatment plan with the patient. The patient was provided an opportunity to ask questions and all were answered. The patient agreed with the plan and demonstrated an understanding of the instructions.   The patient was advised to call back or seek an in-person evaluation if the symptoms worsen or if the condition fails to improve as anticipated.      Review of Systems     Objective:   Physical Exam        Assessment & Plan:

## 2020-10-20 ENCOUNTER — Telehealth: Payer: Self-pay | Admitting: Family Medicine

## 2020-10-20 NOTE — Telephone Encounter (Signed)
Pt call and stated dr.Fry wanted her to take a covid test and let he know when it come back and it was negative.

## 2020-10-20 NOTE — Telephone Encounter (Signed)
FYI

## 2020-10-20 NOTE — Telephone Encounter (Signed)
Noted  

## 2020-11-03 NOTE — Progress Notes (Signed)
Patient Care Team: Laurey Morale, MD as PCP - Haskell Riling, MD (Inactive) as Consulting Physician (General Surgery) Stephanie Lose, MD as Consulting Physician (Hematology and Oncology) Arloa Koh, MD (Inactive) as Consulting Physician (Radiation Oncology) Holley Bouche, NP (Inactive) as Nurse Practitioner (Nurse Practitioner) Sylvan Cheese, NP as Nurse Practitioner (Hematology and Oncology)  DIAGNOSIS:    ICD-10-CM   1. Malignant neoplasm of upper-outer quadrant of right breast in female, estrogen receptor negative ([place])  C50.411    Z17.1       SUMMARY OF ONCOLOGIC HISTORY: Oncology History  Breast cancer of upper-outer quadrant of right female breast ([place])  03/18/2014 Mammogram   Right breast: suspicious mass 10:00 position measuring 4.9 x 3 x 5 cm, 2 lower right axillary lymph nodes mildly thickened cortices    03/23/2014 Initial Biopsy   Right breast needle biopsy 9:00: Invasive ductal carcinoma grade 3, ER- (0%), PR- (0%), HER-2 negative (ratio 1.3), Ki67 95%    03/31/2014 Breast MRI   Right breast mass 9:00 upper outer quadrant with contiguous skin involvement by 5.7 cm, right axillary lymph nodes normal in size    04/01/2014 Clinical Stage   Stage IIB: T2 N0    04/10/2014 - 08/26/2014 Neo-Adjuvant Chemotherapy   Dose dense doxorubicin and cyclophosphamide 4 cycles followed by weekly paclitaxel 12. After week 1, paclitaxel changed to nab-paclitaxel     08/28/2014 Breast MRI   Decrease in the size of the right breast mass from 5.7 cm to 8 mm, skin enhancement is no longer seen, decreased in size of axillary lymph nodes, excellent response to chemotherapy    09/29/2014 Definitive Surgery   Right Lumpectomy / SLNB ([facility]): pathologic CR, 2 LN removed and negative for malignancy (0/2 LN)    11/11/2014 - 12/18/2014 Radiation Therapy   Adjuvant RT [facility]): Right breast 50.4 Gy over 28 fractions    02/24/2015 Survivorship    Survivorship care plan completed and mailed to patient in lieu of in person visit.      CHIEF COMPLIANT: Follow-up of right breast cancer  INTERVAL HISTORY: Stephanie Olsen is a 75 y.o. with above-mentioned history of right breast cancer treated with neoadjuvant chemotherapy, lumpectomy, radiation and is currently on surveillance. Mammogram on 06/09/20 showed no evidence of malignancy bilaterally. She presents to the clinic today for annual follow-up.  Her major complaint today is fatigue and lack of interest in doing a lot of activities.  She takes care of her granddaughter who is 18 years old.  Her daughter had gone through a divorce and therefore she is having to step up and take care of her granddaughter.  ALLERGIES:  is allergic to fosamax [alendronate sodium].  MEDICATIONS:  Current Outpatient Medications  Medication Sig Dispense Refill   Ascorbic Acid (VITAMIN C) 500 MG tablet Take 500 mg by mouth 2 (two) times daily.     azithromycin (ZITHROMAX Z-PAK) 250 MG tablet As directed 6 each 0   Calcium Carb-Cholecalciferol 600-800 MG-UNIT TABS Take 1 tablet by mouth 2 (two) times daily.      cetirizine (ZYRTEC) 10 MG tablet Take 10 mg by mouth daily as needed (seasonal allergies).     clonazePAM (KLONOPIN) 2 MG tablet TAKE 1 TABLET(2 MG) BY MOUTH TWICE DAILY AS NEEDED FOR ANXIETY 180 tablet 1   cyclobenzaprine (FLEXERIL) 10 MG tablet TAKE 1 TABLET BY MOUTH THREE TIMES DAILY AS NEEDED FOR MUSCLE SPASMS 90 tablet 5   dorzolamide-timolol (COSOPT) 22.3-6.8 MG/ML ophthalmic solution Place 1 drop into  both eyes 2 (two) times daily. 10 mL    fish oil-omega-3 fatty acids 1000 MG capsule Take 1 g by mouth at bedtime.     fluticasone (FLONASE) 50 MCG/ACT nasal spray 2 puffs daily 48 g 3   latanoprost (XALATAN) 0.005 % ophthalmic solution Place 1 drop into both eyes at bedtime.     Multiple Vitamins-Minerals (PRESERVISION AREDS 2 PO) Take 1 capsule by mouth 2 (two) times daily.      solifenacin  (VESICARE) 10 MG tablet Take 1 tablet (10 mg total) by mouth daily. 90 tablet 3   No current facility-administered medications for this visit.    PHYSICAL EXAMINATION: ECOG PERFORMANCE STATUS: 1 - Symptomatic but completely ambulatory  Vitals:   11/04/20 1427  BP: (!) 156/72  Pulse: 68  Resp: 18  Temp: 97.6 F (36.4 C)  SpO2: 100%   Filed Weights   11/04/20 1427  Weight: 183 lb 8 oz (83.2 kg)    BREAST: No palpable masses or nodules in either right or left breasts. No palpable axillary supraclavicular or infraclavicular adenopathy no breast tenderness or nipple discharge. (exam performed in the presence of a chaperone)  LABORATORY DATA:  I have reviewed the data as listed CMP Latest Ref Rng & Units 05/28/2020 05/28/2019 09/25/2017  Glucose 70 - 99 mg/dL 81 95 97  BUN 6 - 23 mg/dL _0 Creatinine 0.40 - 1.20 mg/dL 0.68 0.77 0.64  Sodium 135 - 145 mEq/L 135 137 136  Potassium 3.5 - 5.1 mEq/L 4.6 4.7 3.9  Chloride 96 - 112 mEq/L 101 104 101  CO2 19 - 32 mEq/L _1 Calcium 8.4 - 10.5 mg/dL 10.3 9.5 9.5  Total Protein 6.0 - 8.3 g/dL 7.7 6.8 6.9  Total Bilirubin 0.2 - 1.2 mg/dL 0.8 0.9 0.6  Alkaline Phos 39 - 117 U/L 71 70 55  AST 0 - 37 U/L _2 ALT 0 - 35 U/L _3 Lab Results  Component Value Date   WBC 5.3 05/28/2020   HGB 14.6 05/28/2020   HCT 43.2 05/28/2020   MCV 89.1 05/28/2020   PLT 247.0 05/28/2020   NEUTROABS 2.7 05/28/2020    ASSESSMENT & PLAN:  Breast cancer of upper-outer quadrant of right female breast Right breast invasive ductal carcinoma, palpable mass, 5 cm by mammogram and 5.7 cm by MRI grade 3, ER PR HER-2 negative, associated skin dimpling, T3, N0, M0 stage IIB Ki-67 90%   Chemotherapy summary: Neoadjuvant chemotherapy with dose dense Adriamycin and Cytoxan 04/10/2014. followed by weekly Taxol x1 changed to Abraxane 11 from 06/17/2014 completed 08/26/2014 Rt Lumpectomy: 09/29/14: Path CR 0/2 LN Completed adjuvant radiation  therapy 12/18/2014   Breast Cancer Surveillance: 1. Breast exam 11/04/2020: right breast upper quadrant feels tight in the muscle. No palpable nodularity. 2. Mammograms 06/09/2020: Benign , breast density category B 3. Bone density 12/09/2019: T score -1.3 osteopenia (unchanged from before)   Fatigue: Work-up done by her PCP did not reveal any etiology.  I believe part of it is deconditioning and part of it is prior treatment related fatigue  Echocardiogram 11/14/2018: EF 55 to 60%   She continues to stay active and helps take care of her 40 yr old granddaughter.   RTC in 1 yr for continued surveillance    No orders of the defined types were placed in this encounter.  The patient has a good understanding of the overall plan. she agrees with it. she will  call with any problems that may develop before the next visit here.  Total time spent: 20 mins including face to face time and time spent for planning, charting and coordination of care  Rulon Eisenmenger, MD, MPH 11/04/2020  I, Thana Ates, am acting as scribe for Dr. Nicholas Olsen.  I have reviewed the above documentation for accuracy and completeness, and I agree with the above.

## 2020-11-04 ENCOUNTER — Inpatient Hospital Stay: Payer: Medicare Other | Attending: Hematology and Oncology | Admitting: Hematology and Oncology

## 2020-11-04 ENCOUNTER — Other Ambulatory Visit: Payer: Self-pay

## 2020-11-04 DIAGNOSIS — R5383 Other fatigue: Secondary | ICD-10-CM | POA: Diagnosis not present

## 2020-11-04 DIAGNOSIS — Z923 Personal history of irradiation: Secondary | ICD-10-CM | POA: Diagnosis not present

## 2020-11-04 DIAGNOSIS — Z171 Estrogen receptor negative status [ER-]: Secondary | ICD-10-CM | POA: Diagnosis not present

## 2020-11-04 DIAGNOSIS — C50411 Malignant neoplasm of upper-outer quadrant of right female breast: Secondary | ICD-10-CM | POA: Diagnosis not present

## 2020-11-04 DIAGNOSIS — Z79899 Other long term (current) drug therapy: Secondary | ICD-10-CM | POA: Insufficient documentation

## 2020-11-04 NOTE — Assessment & Plan Note (Signed)
Right breast invasive ductal carcinoma, palpable mass, 5 cm by mammogram and 5.7 cm by MRI grade 3, ER PR HER-2 negative, associated skin dimpling, T3, N0, M0 stage IIB Ki-67 90%  Chemotherapy summary: Neoadjuvant chemotherapy with dose dense Adriamycin and Cytoxan 04/10/2014. followed by weekly Taxol x1 changed to Abraxane 11 from 06/17/2014 completed 08/26/2014 Rt Lumpectomy: 09/29/14: Path CR 0/2 LN Completed adjuvant radiation therapy 12/18/2014  Breast Cancer Surveillance: 1. Breast exam 11/04/2020: right breast upper quadrant feels tight in the muscle. No palpable nodularity. 2. Mammograms2/12/2020: Benign , breast density category B 3. Bone density 12/09/2019: T score -1.3 osteopenia (unchanged from before)  Fatigue: Patient exercises regularly but she feels very tired most often. Echocardiogram 11/14/2018: EF 55 to 60%  She continues to stay active and helps take care of her 58 yr old granddaughter.  RTC in 1 yr for continued surveillance

## 2020-12-28 ENCOUNTER — Encounter: Payer: Self-pay | Admitting: Family Medicine

## 2020-12-28 ENCOUNTER — Telehealth (INDEPENDENT_AMBULATORY_CARE_PROVIDER_SITE_OTHER): Payer: Medicare Other | Admitting: Family Medicine

## 2020-12-28 VITALS — Temp 98.7°F

## 2020-12-28 DIAGNOSIS — J40 Bronchitis, not specified as acute or chronic: Secondary | ICD-10-CM | POA: Diagnosis not present

## 2020-12-28 MED ORDER — FLUCONAZOLE 150 MG PO TABS: 150.0000 mg | ORAL_TABLET | Freq: Once | ORAL | 5 refills | Status: AC

## 2020-12-28 MED ORDER — AMOXICILLIN-POT CLAVULANATE 875-125 MG PO TABS
1.0000 | ORAL_TABLET | Freq: Two times a day (BID) | ORAL | 0 refills | Status: DC
Start: 2020-12-28 — End: 2020-12-29

## 2020-12-28 NOTE — Progress Notes (Signed)
Subjective:    Patient ID: Stephanie Olsen, female    DOB: 29-Mar-1946, 75 y.o.   MRN: [id number]  HPI Virtual Visit via Video Note  I connected with the patient on 12/28/20 at  9:00 AM EDT by a video enabled telemedicine application and verified that I am speaking with the correct person using two identifiers.  Location patient: home Location provider:work or home office Persons participating in the virtual visit: patient, provider  I discussed the limitations of evaluation and management by telemedicine and the availability of in person appointments. The patient expressed understanding and agreed to proceed.   HPI: Here for 5 days of chest congestion, a dry cough, and wheezing. No chest pain or SOB. No fever. She has tested negative for the Covid-19 virus. She is drinking fluids and taking Mucinex.    ROS: See pertinent positives and negatives per HPI.  Past Medical History:  Diagnosis Date   Allergy    Anxiety    Breast cancer ([place]) 2016   Right Breast   Breast cancer of upper-outer quadrant of right female breast ([place]) 03/25/2014   CHF (congestive heart failure) ([place])    Chicken pox    Dilated cardiomyopathy ([place])    sees Dr. Haroldine Laws    Glaucoma    sees Dr. Sarita Haver    [id number])    Hypertension    Neuromuscular disorder [facility])    peripheral neuropathy from chemo   Osteopenia    Osteoporosis    pt states has ostopenia not osteoporosis    Personal history of chemotherapy    2015-2016   Personal history of radiation therapy    2016   Rosacea    S/P radiation therapy 11/11/2014 through 12/18/2014                                                      Right breast 5040 cGy in 28 sessions                          Past Surgical History:  Procedure Laterality Date   APPENDECTOMY     BREAST LUMPECTOMY Right 09/28/2014   Malignant   BREAST LUMPECTOMY WITH RADIOACTIVE SEED AND SENTINEL LYMPH NODE BIOPSY Right 09/29/2014   Procedure: RIGHT BREAST LUMPECTOMY  WITH RADIOACTIVE SEED AND RIGHT AXILLARY SENTINEL LYMPH NODE BIOPSY;  Surgeon: Excell Seltzer, MD;  Location: [place];  Service: General;  Laterality: Right;   cataract surgery on both eyes     12 years ago   COLONOSCOPY  06/2016   per Dr. Earlean Shawl, adenomatous polyps,  repeat in 5 yrs   EYE SURGERY     laser per right eye related to pressure relief; past cataract surgery bilat    laser of left eye     1 week ago   PORT-A-CATH REMOVAL Left 09/29/2014   Procedure: REMOVAL PORT-A-CATH;  Surgeon: Excell Seltzer, MD;  Location: [place];  Service: General;  Laterality: Left;   PORTACATH PLACEMENT N/A 04/07/2014   Procedure: INSERTION PORT-A-CATH;  Surgeon: Excell Seltzer, MD;  [facility];  Service: General;  Laterality: N/A;    Family History  Problem Relation Age of Onset   Lung cancer Maternal Uncle        heavy smoker   Colon cancer Paternal  Grandmother 10   Lung cancer Cousin        smoker   Leukemia Cousin 3   Arthritis Other    Coronary artery disease Other    Hypertension Other    Stroke Other    Macular degeneration Other      Current Outpatient Medications:    Ascorbic Acid (VITAMIN C) 500 MG tablet, Take 500 mg by mouth 2 (two) times daily., Disp: , Rfl:    Calcium Carb-Cholecalciferol 600-800 MG-UNIT TABS, Take 1 tablet by mouth 2 (two) times daily. , Disp: , Rfl:    cetirizine (ZYRTEC) 10 MG tablet, Take 10 mg by mouth daily as needed (seasonal allergies)., Disp: , Rfl:    clonazePAM (KLONOPIN) 2 MG tablet, TAKE 1 TABLET(2 MG) BY MOUTH TWICE DAILY AS NEEDED FOR ANXIETY, Disp: 180 tablet, Rfl: 1   cyclobenzaprine (FLEXERIL) 10 MG tablet, TAKE 1 TABLET BY MOUTH THREE TIMES DAILY AS NEEDED FOR MUSCLE SPASMS, Disp: 90 tablet, Rfl: 5   dorzolamide-timolol (COSOPT) 22.3-6.8 MG/ML ophthalmic solution, Place 1 drop into both eyes 2 (two) times daily., Disp: 10 mL, Rfl:    fish oil-omega-3 fatty acids 1000 MG capsule, Take 1 g by  mouth at bedtime., Disp: , Rfl:    fluticasone (FLONASE) 50 MCG/ACT nasal spray, 2 puffs daily, Disp: 48 g, Rfl: 3   latanoprost (XALATAN) 0.005 % ophthalmic solution, Place 1 drop into both eyes at bedtime., Disp: , Rfl:    Multiple Vitamins-Minerals (PRESERVISION AREDS 2 PO), Take 1 capsule by mouth 2 (two) times daily. , Disp: , Rfl:    solifenacin (VESICARE) 10 MG tablet, Take 1 tablet (10 mg total) by mouth daily., Disp: 90 tablet, Rfl: 3  EXAM:  VITALS per patient if applicable:  GENERAL: alert, oriented, appears well and in no acute distress  HEENT: atraumatic, conjunttiva clear, no obvious abnormalities on inspection of external nose and ears  NECK: normal movements of the head and neck  LUNGS: on inspection no signs of respiratory distress, breathing rate appears normal, no obvious gross SOB, gasping or wheezing  CV: no obvious cyanosis  MS: moves all visible extremities without noticeable abnormality  PSYCH/NEURO: pleasant and cooperative, no obvious depression or anxiety, speech and thought processing grossly intact  ASSESSMENT AND PLAN: Bronchitis, treat with 10 days of Augmentin.  Alysia Penna, MD  Discussed the following assessment and plan:  No diagnosis found.     I discussed the assessment and treatment plan with the patient. The patient was provided an opportunity to ask questions and all were answered. The patient agreed with the plan and demonstrated an understanding of the instructions.   The patient was advised to call back or seek an in-person evaluation if the symptoms worsen or if the condition fails to improve as anticipated.      Review of Systems     Objective:   Physical Exam        Assessment & Plan:

## 2020-12-29 ENCOUNTER — Ambulatory Visit: Payer: Medicare Other | Admitting: Family Medicine

## 2020-12-29 ENCOUNTER — Telehealth: Payer: Self-pay | Admitting: Family Medicine

## 2020-12-29 ENCOUNTER — Other Ambulatory Visit: Payer: Self-pay

## 2020-12-29 ENCOUNTER — Telehealth: Payer: Self-pay

## 2020-12-29 MED ORDER — CEFUROXIME AXETIL 500 MG PO TABS
500.0000 mg | ORAL_TABLET | Freq: Two times a day (BID) | ORAL | 0 refills | Status: AC
Start: 2020-12-29 — End: 2021-01-08

## 2020-12-29 NOTE — Telephone Encounter (Signed)
Spoke with pt advised that she does not need an office visit since Dr Sarajane Jews has already sent in a different antibiotic to her pharmacy,pt advised to call the office if not feeling better.

## 2020-12-29 NOTE — Telephone Encounter (Signed)
It's probably from the Augmentin. Stop this and call in Cefuroxime 500 mg BID for 10 days

## 2020-12-29 NOTE — Telephone Encounter (Signed)
Please advise 

## 2020-12-29 NOTE — Telephone Encounter (Signed)
Patient is currently taking Amoxicillin '875mg'$ . Please advise

## 2020-12-29 NOTE — Telephone Encounter (Signed)
Patient called stated that the antibiotic may be to strong she is unable to hold anything down and diarrhea she is not sure if it is the antibiotic or a viral bug pt would like to have a call back

## 2020-12-29 NOTE — Telephone Encounter (Signed)
Pt is aware that Rx for Ceftin 500 mg was sent to her pharmacy

## 2020-12-29 NOTE — Telephone Encounter (Signed)
Patient is concerned about not getting her antibiotics. She stated that the drug store said they didn't have it. She just wants to know when she will receive it.  Patient can be contacted at [phone].  Please advise.

## 2020-12-29 NOTE — Telephone Encounter (Signed)
Patient called again because she hasn't heard anything back from Dr.Fry. I sent her to triage and let her know he was still seeing patients.

## 2020-12-31 ENCOUNTER — Other Ambulatory Visit: Payer: Self-pay

## 2020-12-31 ENCOUNTER — Telehealth: Payer: Self-pay

## 2020-12-31 MED ORDER — ONDANSETRON HCL 8 MG PO TABS: 8.0000 mg | ORAL_TABLET | Freq: Three times a day (TID) | ORAL | 0 refills | Status: DC | PRN

## 2020-12-31 NOTE — Telephone Encounter (Signed)
Patient called stating she has an upset stomach and hasn't been able to hold anything down for days patient requesting a call back and was transferred to a triage nurse

## 2020-12-31 NOTE — Telephone Encounter (Signed)
Medication sent in per Dr Sarajane Jews

## 2020-12-31 NOTE — Telephone Encounter (Signed)
Call in Zofran 8 mg to take every 6 hours as needed for nausea, #30 with one rf

## 2020-12-31 NOTE — Telephone Encounter (Signed)
Please advise 

## 2020-12-31 NOTE — Telephone Encounter (Signed)
New Rx for Zofran 8 mg was sent to pt pharmacy per Dr Sarajane Jews, pt is aware to pick up Rx

## 2021-01-17 DIAGNOSIS — H524 Presbyopia: Secondary | ICD-10-CM | POA: Diagnosis not present

## 2021-01-17 DIAGNOSIS — H401132 Primary open-angle glaucoma, bilateral, moderate stage: Secondary | ICD-10-CM | POA: Diagnosis not present

## 2021-01-17 DIAGNOSIS — H353132 Nonexudative age-related macular degeneration, bilateral, intermediate dry stage: Secondary | ICD-10-CM | POA: Diagnosis not present

## 2021-01-17 DIAGNOSIS — H35371 Puckering of macula, right eye: Secondary | ICD-10-CM | POA: Diagnosis not present

## 2021-02-15 ENCOUNTER — Ambulatory Visit (INDEPENDENT_AMBULATORY_CARE_PROVIDER_SITE_OTHER): Payer: Medicare Other | Admitting: Family Medicine

## 2021-02-15 ENCOUNTER — Other Ambulatory Visit: Payer: Self-pay

## 2021-02-15 ENCOUNTER — Encounter: Payer: Self-pay | Admitting: Family Medicine

## 2021-02-15 VITALS — BP 120/80 | HR 71 | Temp 98.3°F | Wt 178.0 lb

## 2021-02-15 DIAGNOSIS — R058 Other specified cough: Secondary | ICD-10-CM

## 2021-02-15 MED ORDER — METHYLPREDNISOLONE 4 MG PO TBPK: ORAL_TABLET | ORAL | 0 refills | Status: DC

## 2021-02-15 MED ORDER — ALBUTEROL SULFATE HFA 108 (90 BASE) MCG/ACT IN AERS
2.0000 | INHALATION_SPRAY | RESPIRATORY_TRACT | 0 refills | Status: DC | PRN
Start: 2021-02-15 — End: 2021-05-12

## 2021-02-15 NOTE — Progress Notes (Signed)
   Subjective:    Patient ID: Stephanie Olsen, female    DOB: [birth year], 75 y.o.   MRN: [id number]  HPI Here for a lingering cough and wheezing. She was seen here on 12-28-20 for a bronchitis, and she was treated with Ceftin. She felt better in most ways after that, but the cough and occasional wheezing has remained. No fever or chest pain. She has tested negative for the Covid-19 virus several times.   Review of Systems  Constitutional: Negative.   HENT: Negative.    Eyes: Negative.   Respiratory:  Positive for cough and wheezing. Negative for shortness of breath.   Cardiovascular: Negative.       Objective:   Physical Exam Constitutional:      Appearance: Normal appearance. She is not ill-appearing.  Cardiovascular:     Rate and Rhythm: Normal rate and regular rhythm.     Pulses: Normal pulses.     Heart sounds: Normal heart sounds.  Pulmonary:     Effort: Pulmonary effort is normal. No respiratory distress.     Breath sounds: Normal breath sounds. No wheezing, rhonchi or rales.  Lymphadenopathy:     Cervical: No cervical adenopathy.  Neurological:     Mental Status: She is alert.          Assessment & Plan:  She has some lingering reactive airways after the bronchitis, so we will treat these with a Medrol dose pack and an albuterol inhaler. Recheck as needed.  Alysia Penna, MD

## 2021-03-18 DIAGNOSIS — Z23 Encounter for immunization: Secondary | ICD-10-CM | POA: Diagnosis not present

## 2021-04-08 DIAGNOSIS — Z23 Encounter for immunization: Secondary | ICD-10-CM | POA: Diagnosis not present

## 2021-04-19 ENCOUNTER — Telehealth: Payer: Self-pay | Admitting: Family Medicine

## 2021-04-19 NOTE — Telephone Encounter (Signed)
Patient calling in with respiratory symptoms: Shortness of breath, chest pain, palpitations or other red words send to Triage  Does the patient have a fever over 100, cough, congestion, sore throat, runny nose, lost of taste/smell (please list symptoms that patient has)?head congestion, cough, sore throat   What date did symptoms start?04-15-2021 (If over 5 days ago, pt may be scheduled for in person visit)  Have you tested for Covid in the last 5 days? Yes   If yes, was it positive []  OR negative [] ? If positive in the last 5 days, please schedule virtual visit now. If negative, schedule for an in person OV with the next available provider if PCP has no openings. Please also let patient know they will be tested again (follow the script below)  "you will have to arrive 5mins prior to your appt time to be Covid tested. Please [facility] in back [facility] & call [phone] to let the staff know you have arrived. A staff member will meet you at your car to do a rapid covid test. Once the test has resulted you will be notified by phone of your results to determine if appt will remain an in person visit or be converted to a virtual/phone visit. If you arrive less than 75mins before your appt time, your visit will be automatically converted to virtual & any recommended testing will happen AFTER the visit."  Pt has virtual with dr fry 04-20-2021 THINGS TO REMEMBER  If no availability for virtual visit in office,  please schedule another [place] office  If no availability at another [place] office, please instruct patient that they can schedule an evisit or virtual visit through their mychart account. Visits up to 8pm  patients can be seen in office 5 days after positive COVID test

## 2021-04-20 ENCOUNTER — Telehealth (INDEPENDENT_AMBULATORY_CARE_PROVIDER_SITE_OTHER): Payer: Medicare Other | Admitting: Family Medicine

## 2021-04-20 ENCOUNTER — Encounter: Payer: Self-pay | Admitting: Family Medicine

## 2021-04-20 DIAGNOSIS — J019 Acute sinusitis, unspecified: Secondary | ICD-10-CM

## 2021-04-20 MED ORDER — DOXYCYCLINE HYCLATE 100 MG PO CAPS: 100.0000 mg | ORAL_CAPSULE | Freq: Two times a day (BID) | ORAL | 0 refills | Status: AC

## 2021-04-20 MED ORDER — BENZONATATE 200 MG PO CAPS
200.0000 mg | ORAL_CAPSULE | Freq: Four times a day (QID) | ORAL | 1 refills | Status: DC | PRN
Start: 2021-04-20 — End: 2021-05-12

## 2021-04-20 NOTE — Progress Notes (Signed)
° °  Subjective:    Patient ID: Stephanie Olsen, female    DOB: 09-28-1945, 75 y.o.   MRN: [id number]  HPI Virtual Visit via Telephone Note  I connected with the patient on 04/20/21 at  2:45 PM EST by telephone and verified that I am speaking with the correct person using two identifiers.   I discussed the limitations, risks, security and privacy concerns of performing an evaluation and management service by telephone and the availability of in person appointments. I also discussed with the patient that there may be a patient responsible charge related to this service. The patient expressed understanding and agreed to proceed.  Location patient: home Location provider: work or home office Participants present for the call: patient, provider Patient did not have a visit in the prior 7 days to address this/these issue(s).   History of Present Illness: Here for 4 days of sinus pressure, PND, ST, and a dry cough. No fever or NVD. She tested negative for the Covid-19 virus 2 days ago.    Observations/Objective: Patient sounds cheerful and well on the phone. I do not appreciate any SOB. Speech and thought processing are grossly intact. Patient reported vitals:  Assessment and Plan: Sinusitis, treat with Doxcycline. Add benzonatate and Mucinex as needed.  Alysia Penna, MD   Follow Up Instructions:     [phone] 5-10 [phone] 11-20 [phone] 21-30 I did not refer this patient for an OV in the next 24 hours for this/these issue(s).  I discussed the assessment and treatment plan with the patient. The patient was provided an opportunity to ask questions and all were answered. The patient agreed with the plan and demonstrated an understanding of the instructions.   The patient was advised to call back or seek an in-person evaluation if the symptoms worsen or if the condition fails to improve as anticipated.  I provided 16 minutes of non-face-to-face time during this encounter.   Alysia Penna, MD      Review of Systems     Objective:   Physical Exam        Assessment & Plan:

## 2021-04-29 ENCOUNTER — Telehealth: Payer: Self-pay

## 2021-04-29 NOTE — Telephone Encounter (Signed)
---  Caller states she is congested, cough, and is on doxycycline and a cough medication for a sinus infection. Pt saw her provider last week. Pts daughter tested positive for covid and the pt was exposed on Christmas eve. Pt is fully vaccinated against covid. hx cancer. Denies any other symptoms at this time.  04/29/2021 12:47:04 PM Call PCP within 24 Hours Groh, RN, Margreta Journey  Comments User: Aletta Edouard, RN Date/Time Eilene Ghazi Time): 04/29/2021 12:48:39 PM Pt states there were no appts at the office at this time. Pt instructed to be seen at an UC. Pt verbalized understanding.  Referrals GO TO FACILITY UNDECIDED

## 2021-05-12 ENCOUNTER — Ambulatory Visit (INDEPENDENT_AMBULATORY_CARE_PROVIDER_SITE_OTHER): Payer: Medicare Other

## 2021-05-12 VITALS — Ht 65.0 in | Wt 178.0 lb

## 2021-05-12 DIAGNOSIS — Z0000 Encounter for general adult medical examination without abnormal findings: Secondary | ICD-10-CM
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
## 2021-05-12 NOTE — Patient Instructions (Signed)
[name] , Thank you for taking time to come for your Medicare Wellness Visit. I appreciate your ongoing commitment to your health goals. Please review the following plan we discussed and let me know if I can assist you in the future.   These are the goals we discussed:  Goals      Exercise 150 minutes per week (moderate activity)     Re-engage in exercise as tolerated Enjoy your yoga      Patient Stated     I would like to find a gym that allows me to do Yoga!        This is a list of the screening recommended for you and due dates:  Health Maintenance  Topic Date Due   Zoster (Shingles) Vaccine (1 of 2) Never done   Pneumonia Vaccine (2 - PPSV23 if available, else PCV20) 07/29/2020   Flu Shot  11/29/2020   Colon Cancer Screening  10/03/2021   Mammogram  06/09/2022   Tetanus Vaccine  10/31/2025   DEXA scan (bone density measurement)  Completed   Hepatitis C Screening: USPSTF Recommendation to screen - Ages 18-79 yo.  Completed   HPV Vaccine  Aged Out   COVID-19 Vaccine  Discontinued    Advanced directives: No   Conditions/risks identified: None  Next appointment: Follow up in one year for your annual wellness visit    Preventive Care 65 Years and Older, Female Preventive care refers to lifestyle choices and visits with your health care provider that can promote health and wellness. What does preventive care include? A yearly physical exam. This is also called an annual well check. Dental exams once or twice a year. Routine eye exams. Ask your health care provider how often you should have your eyes checked. Personal lifestyle choices, including: Daily care of your teeth and gums. Regular physical activity. Eating a healthy diet. Avoiding tobacco and drug use. Limiting alcohol use. Practicing safe sex. Taking low-dose aspirin every day. Taking vitamin and mineral supplements as recommended by your health care provider. What happens during an annual well  check? The services and screenings done by your health care provider during your annual well check will depend on your age, overall health, lifestyle risk factors, and family history of disease. Counseling  Your health care provider may ask you questions about your: Alcohol use. Tobacco use. Drug use. Emotional well-being. Home and relationship well-being. Sexual activity. Eating habits. History of falls. Memory and ability to understand (cognition). Work and work Statistician. Reproductive health. Screening  You may have the following tests or measurements: Height, weight, and BMI. Blood pressure. Lipid and cholesterol levels. These may be checked every 5 years, or more frequently if you are over 38 years old. Skin check. Lung cancer screening. You may have this screening every year starting at age 59 if you have a 30-pack-year history of smoking and currently smoke or have quit within the past 15 years. Fecal occult blood test (FOBT) of the stool. You may have this test every year starting at age 89. Flexible sigmoidoscopy or colonoscopy. You may have a sigmoidoscopy every 5 years or a colonoscopy every 10 years starting at age 27. Hepatitis C blood test. Hepatitis B blood test. Sexually transmitted disease (STD) testing. Diabetes screening. This is done by checking your blood sugar (glucose) after you have not eaten for a while (fasting). You may have this done every 1-3 years. Bone density scan. This is done to screen for osteoporosis. You may have this done  starting at age 34. Mammogram. This may be done every 1-2 years. Talk to your health care provider about how often you should have regular mammograms. Talk with your health care provider about your test results, treatment options, and if necessary, the need for more tests. Vaccines  Your health care provider may recommend certain vaccines, such as: Influenza vaccine. This is recommended every year. Tetanus, diphtheria, and  acellular pertussis (Tdap, Td) vaccine. You may need a Td booster every 10 years. Zoster vaccine. You may need this after age 68. Pneumococcal 13-valent conjugate (PCV13) vaccine. One dose is recommended after age 76. Pneumococcal polysaccharide (PPSV23) vaccine. One dose is recommended after age 55. Talk to your health care provider about which screenings and vaccines you need and how often you need them. This information is not intended to replace advice given to you by your health care provider. Make sure you discuss any questions you have with your health care provider. Document Released: 05/14/2015 Document Revised: 01/05/2016 Document Reviewed: 02/16/2015 Elsevier Interactive Patient Education  2017 [place] Prevention in the Home Falls can cause injuries. They can happen to people of all ages. There are many things you can do to make your home safe and to help prevent falls. What can I do on the outside of my home? Regularly fix the edges of walkways and driveways and fix any cracks. Remove anything that might make you trip as you walk through a door, such as a raised step or threshold. Trim any bushes or trees on the path to your home. Use bright outdoor lighting. Clear any walking paths of anything that might make someone trip, such as rocks or tools. Regularly check to see if handrails are loose or broken. Make sure that both sides of any steps have handrails. Any raised decks and porches should have guardrails on the edges. Have any leaves, snow, or ice cleared regularly. Use sand or salt on walking paths during winter. Clean up any spills in your garage right away. This includes oil or grease spills. What can I do in the bathroom? Use night lights. Install grab bars by the toilet and in the tub and shower. Do not use towel bars as grab bars. Use non-skid mats or decals in the tub or shower. If you need to sit down in the shower, use a plastic, non-slip stool. Keep  the floor dry. Clean up any water that spills on the floor as soon as it happens. Remove soap buildup in the tub or shower regularly. Attach bath mats securely with double-sided non-slip rug tape. Do not have throw rugs and other things on the floor that can make you trip. What can I do in the bedroom? Use night lights. Make sure that you have a light by your bed that is easy to reach. Do not use any sheets or blankets that are too big for your bed. They should not hang down onto the floor. Have a firm chair that has side arms. You can use this for support while you get dressed. Do not have throw rugs and other things on the floor that can make you trip. What can I do in the kitchen? Clean up any spills right away. Avoid walking on wet floors. Keep items that you use a lot in easy-to-reach places. If you need to reach something above you, use a strong step stool that has a grab bar. Keep electrical cords out of the way. Do not use floor polish or wax that  makes floors slippery. If you must use wax, use non-skid floor wax. Do not have throw rugs and other things on the floor that can make you trip. What can I do with my stairs? Do not leave any items on the stairs. Make sure that there are handrails on both sides of the stairs and use them. Fix handrails that are broken or loose. Make sure that handrails are as long as the stairways. Check any carpeting to make sure that it is firmly attached to the stairs. Fix any carpet that is loose or worn. Avoid having throw rugs at the top or bottom of the stairs. If you do have throw rugs, attach them to the floor with carpet tape. Make sure that you have a light switch at the top of the stairs and the bottom of the stairs. If you do not have them, ask someone to add them for you. What else can I do to help prevent falls? Wear shoes that: Do not have high heels. Have rubber bottoms. Are comfortable and fit you well. Are closed at the toe. Do not  wear sandals. If you use a stepladder: Make sure that it is fully opened. Do not climb a closed stepladder. Make sure that both sides of the stepladder are locked into place. Ask someone to hold it for you, if possible. Clearly mark and make sure that you can see: Any grab bars or handrails. First and last steps. Where the edge of each step is. Use tools that help you move around (mobility aids) if they are needed. These include: Canes. Walkers. Scooters. Crutches. Turn on the lights when you go into a dark area. Replace any light bulbs as soon as they burn out. Set up your furniture so you have a clear path. Avoid moving your furniture around. If any of your floors are uneven, fix them. If there are any pets around you, be aware of where they are. Review your medicines with your doctor. Some medicines can make you feel dizzy. This can increase your chance of falling. Ask your doctor what other things that you can do to help prevent falls. This information is not intended to replace advice given to you by your health care provider. Make sure you discuss any questions you have with your health care provider. Document Released: 02/11/2009 Document Revised: 09/23/2015 Document Reviewed: 05/22/2014 Elsevier Interactive Patient Education  2017 [employer].

## 2021-05-26 ENCOUNTER — Other Ambulatory Visit: Payer: Self-pay | Admitting: Hematology and Oncology

## 2021-05-26 DIAGNOSIS — Z1231 Encounter for screening mammogram for malignant neoplasm of breast: Secondary | ICD-10-CM

## 2021-06-10 ENCOUNTER — Ambulatory Visit
Admission: RE | Admit: 2021-06-10 | Discharge: 2021-06-10 | Disposition: A | Discharge status: Home / Self Care | Payer: Medicare Other | Source: Ambulatory Visit | Attending: Hematology and Oncology | Admitting: Hematology and Oncology

## 2021-06-10 DIAGNOSIS — Z1231 Encounter for screening mammogram for malignant neoplasm of breast: Secondary | ICD-10-CM | POA: Diagnosis not present

## 2021-06-20 DIAGNOSIS — H353132 Nonexudative age-related macular degeneration, bilateral, intermediate dry stage: Secondary | ICD-10-CM | POA: Diagnosis not present

## 2021-06-20 DIAGNOSIS — H401132 Primary open-angle glaucoma, bilateral, moderate stage: Secondary | ICD-10-CM | POA: Diagnosis not present

## 2021-06-20 DIAGNOSIS — H04123 Dry eye syndrome of bilateral lacrimal glands: Secondary | ICD-10-CM | POA: Diagnosis not present

## 2021-06-30 ENCOUNTER — Other Ambulatory Visit: Payer: Self-pay | Admitting: Family Medicine

## 2021-06-30 ENCOUNTER — Telehealth: Payer: Self-pay | Admitting: Family Medicine

## 2021-06-30 MED ORDER — SOLIFENACIN SUCCINATE 10 MG PO TABS: 10.0000 mg | ORAL_TABLET | Freq: Every day | ORAL | 0 refills | Status: DC

## 2021-06-30 NOTE — Telephone Encounter (Signed)
Rx sent 

## 2021-06-30 NOTE — Telephone Encounter (Signed)
Patient called in requesting a refill for solifenacin (VESICARE) 10 MG tablet [[id number]] to be sent to her pharmacy. Patient is out and needs a refill as soon as possible. ? ?Please advise. ?

## 2021-09-27 ENCOUNTER — Other Ambulatory Visit: Payer: Self-pay | Admitting: Family Medicine

## 2021-10-17 ENCOUNTER — Other Ambulatory Visit: Payer: Self-pay | Admitting: *Deleted

## 2021-10-17 DIAGNOSIS — Z171 Estrogen receptor negative status [ER-]: Secondary | ICD-10-CM

## 2021-10-24 DIAGNOSIS — H52203 Unspecified astigmatism, bilateral: Secondary | ICD-10-CM | POA: Diagnosis not present

## 2021-10-24 DIAGNOSIS — H35371 Puckering of macula, right eye: Secondary | ICD-10-CM | POA: Diagnosis not present

## 2021-10-24 DIAGNOSIS — H401132 Primary open-angle glaucoma, bilateral, moderate stage: Secondary | ICD-10-CM | POA: Diagnosis not present

## 2021-10-24 DIAGNOSIS — H353132 Nonexudative age-related macular degeneration, bilateral, intermediate dry stage: Secondary | ICD-10-CM | POA: Diagnosis not present

## 2021-10-27 ENCOUNTER — Encounter: Payer: Self-pay | Admitting: Podiatry

## 2021-10-27 ENCOUNTER — Ambulatory Visit (INDEPENDENT_AMBULATORY_CARE_PROVIDER_SITE_OTHER): Payer: Medicare Other | Admitting: Podiatry

## 2021-10-27 DIAGNOSIS — B351 Tinea unguium: Secondary | ICD-10-CM | POA: Diagnosis not present

## 2021-10-27 DIAGNOSIS — L600 Ingrowing nail: Secondary | ICD-10-CM | POA: Diagnosis not present

## 2021-10-27 NOTE — Patient Instructions (Signed)

## 2021-10-28 NOTE — Progress Notes (Signed)
Subjective:   Patient ID: Stephanie Olsen, female   DOB: 76 y.o.   MRN: [id number]   HPI Patient presents stating she has had terrible problems with her left big toenail being thickened and hard to cut and now she is torn it and it is partially detached from the bed and its been a chronic problem for a fairly long time.  Patient is tried topical medicines which are no effect on this nailbed and patient does not smoke likes to be active   Review of Systems  All other systems reviewed and are negative.       Objective:  Physical Exam Vitals and nursing note reviewed.  Constitutional:      Appearance: She is well-developed.  Pulmonary:     Effort: Pulmonary effort is normal.  Musculoskeletal:        General: Normal range of motion.  Skin:    General: Skin is warm.  Neurological:     Mental Status: She is alert.     Neurovascular status intact muscle strength found to be adequate range of motion within normal limits.  Patient is found to have a severely thickened dystrophic left hallux nail that is inflamed and painful and partially detached and has severe deformity of the bed itself     Assessment:  Chronic nail disease left big toenail with pain     Plan:  H&P reviewed condition and recommended nail removal.  At this point I explained procedure risk and patient wants to have this done understanding it is a permanent procedure and signed consent form.  Today I infiltrated the left hallux 60 mg Xylocaine Marcaine mixture sterile prep done using sterile instrumentation remove the nail exposed matrix applied phenol 5 applications 30 seconds followed by alcohol lavage sterile dressing gave instructions on soaks and to leave dressing on 24 hours but take it off earlier if throbbing were to occur.  Encouraged her to call questions concerns which may arise

## 2021-11-03 ENCOUNTER — Inpatient Hospital Stay: Payer: Medicare Other | Attending: Hematology and Oncology | Admitting: Hematology and Oncology

## 2021-11-03 ENCOUNTER — Inpatient Hospital Stay: Payer: Medicare Other | Admitting: Hematology and Oncology

## 2021-11-03 ENCOUNTER — Inpatient Hospital Stay: Payer: Medicare Other

## 2021-11-03 DIAGNOSIS — Z9221 Personal history of antineoplastic chemotherapy: Secondary | ICD-10-CM | POA: Insufficient documentation

## 2021-11-03 DIAGNOSIS — C50411 Malignant neoplasm of upper-outer quadrant of right female breast: Secondary | ICD-10-CM

## 2021-11-03 DIAGNOSIS — Z79899 Other long term (current) drug therapy: Secondary | ICD-10-CM | POA: Diagnosis not present

## 2021-11-03 DIAGNOSIS — Z923 Personal history of irradiation: Secondary | ICD-10-CM | POA: Diagnosis not present

## 2021-11-03 DIAGNOSIS — Z171 Estrogen receptor negative status [ER-]: Secondary | ICD-10-CM | POA: Diagnosis not present

## 2021-11-03 LAB — CBC WITH DIFFERENTIAL (CANCER CENTER ONLY)
Abs Immature Granulocytes: 0.02 10*3/uL (ref 0.00–0.07)
Basophils Absolute: 0 10*3/uL (ref 0.0–0.1)
Basophils Relative: 0 %
Eosinophils Absolute: 0.2 10*3/uL (ref 0.0–0.5)
Eosinophils Relative: 3 %
HCT: 37.1 % (ref 36.0–46.0)
Hemoglobin: 12.9 g/dL (ref 12.0–15.0)
Immature Granulocytes: 0 %
Lymphocytes Relative: 28 %
Lymphs Abs: 2.1 10*3/uL (ref 0.7–4.0)
MCH: 30.2 pg (ref 26.0–34.0)
MCHC: 34.8 g/dL (ref 30.0–36.0)
MCV: 86.9 fL (ref 80.0–100.0)
Monocytes Absolute: 0.8 10*3/uL (ref 0.1–1.0)
Monocytes Relative: 11 %
Neutro Abs: 4.3 10*3/uL (ref 1.7–7.7)
Neutrophils Relative %: 58 %
Platelet Count: 207 10*3/uL (ref 150–400)
RBC: 4.27 MIL/uL (ref 3.87–5.11)
RDW: 13.7 % (ref 11.5–15.5)
WBC Count: 7.4 10*3/uL (ref 4.0–10.5)
nRBC: 0 % (ref 0.0–0.2)

## 2021-11-03 LAB — CMP (CANCER CENTER ONLY)
ALT: 11 U/L (ref 0–44)
AST: 18 U/L (ref 15–41)
Albumin: 4 g/dL (ref 3.5–5.0)
Alkaline Phosphatase: 57 U/L (ref 38–126)
Anion gap: 6 (ref 5–15)
BUN: 10 mg/dL (ref 8–23)
CO2: 25 mmol/L (ref 22–32)
Calcium: 9.4 mg/dL (ref 8.9–10.3)
Chloride: 99 mmol/L (ref 98–111)
Creatinine: 0.75 mg/dL (ref 0.44–1.00)
GFR, Estimated: >60 mL/min (ref >60)
Glucose, Bld: 86 mg/dL (ref 70–99)
Potassium: 3.6 mmol/L (ref 3.5–5.1)
Sodium: 130 mmol/L — ABNORMAL LOW (ref 135–145)
Total Bilirubin: 0.8 mg/dL (ref 0.3–1.2)
Total Protein: 6.9 g/dL (ref 6.5–8.1)

## 2021-11-03 NOTE — Assessment & Plan Note (Addendum)
Right breast invasive ductal carcinoma, palpable mass, 5 cm by mammogram and 5.7 cm by MRI grade 3, ER PR HER-2 negative, associated skin dimpling, T3, N0, M0 stage IIB Ki-67 90%  Chemotherapy summary: Neoadjuvant chemotherapy with dose dense Adriamycin and Cytoxan 04/10/2014. followed by weekly Taxol x1 changed to Abraxane 11 from 06/17/2014 completed 08/26/2014 Rt Lumpectomy: 09/29/14: Path CR 0/2 LN Completed adjuvant radiation therapy 12/18/2014  Breast Cancer Surveillance: 1. Breast exam 11/04/2020: right breast upper quadrant feels tight in the muscle. No palpable nodularity. 2. Mammograms2/12/2020: Benign , breast density category B 3. Bone density 12/09/2019: T score -1.3 osteopenia (unchanged from before)  Fatigue: Patient exercises regularly but she feels very tired most often. Echocardiogram 11/14/2018: EF 55 to 60%  She continues to stay active and helps take care of her 30 yr old granddaughter.  RTC in 1 yr for continued surveillance

## 2021-11-03 NOTE — Progress Notes (Signed)
Patient Care Team: Laurey Morale, MD as PCP - General (Family Medicine) Excell Seltzer, MD (Inactive) as Consulting Physician (General Surgery) Nicholas Lose, MD as Consulting Physician (Hematology and Oncology) Arloa Koh, MD (Inactive) as Consulting Physician (Radiation Oncology) Holley Bouche, NP (Inactive) as Nurse Practitioner (Nurse Practitioner) Sylvan Cheese, NP as Nurse Practitioner (Hematology and Oncology)  DIAGNOSIS:  Encounter Diagnosis  Name Primary?   Malignant neoplasm of upper-outer quadrant of right breast in female, estrogen receptor negative ([place])     SUMMARY OF ONCOLOGIC HISTORY: Oncology History  Breast cancer of upper-outer quadrant of right female breast (Stephanie Olsen)  03/18/2014 Mammogram   Right breast: suspicious mass 10:00 position measuring 4.9 x 3 x 5 cm, 2 lower right axillary lymph nodes mildly thickened cortices   03/23/2014 Initial Biopsy   Right breast needle biopsy 9:00: Invasive ductal carcinoma grade 3, ER- (0%), PR- (0%), HER-2 negative (ratio 1.3), Ki67 95%   03/31/2014 Breast MRI   Right breast mass 9:00 upper outer quadrant with contiguous skin involvement by 5.7 cm, right axillary lymph nodes normal in size   04/01/2014 Clinical Stage   Stage IIB: T2 N0   04/10/2014 - 08/26/2014 Neo-Adjuvant Chemotherapy   Dose dense doxorubicin and cyclophosphamide 4 cycles followed by weekly paclitaxel 12. After week 1, paclitaxel changed to nab-paclitaxel    08/28/2014 Breast MRI   Decrease in the size of the right breast mass from 5.7 cm to 8 mm, skin enhancement is no longer seen, decreased in size of axillary lymph nodes, excellent response to chemotherapy   09/29/2014 Definitive Surgery   Right Lumpectomy / SLNB ([facility]): pathologic CR, 2 LN removed and negative for malignancy (0/2 LN)   11/11/2014 - 12/18/2014 Radiation Therapy   Adjuvant RT Valere Dross): Right breast 50.4 Gy over 28 fractions   02/24/2015 Survivorship    Survivorship care plan completed and mailed to patient in lieu of in person visit.     CHIEF COMPLIANT: Follow-up of triple breast cancer surveillance    INTERVAL HISTORY: Stephanie Olsen is a 76 y.o. with above-mentioned history of right breast cancer treated with neoadjuvant She presents to the clinic today for a follow-up. She states that she has some sever fatigue not relieve with rest. She has been going through a lot of things she lost 2 female cousins. Overall she feels alright. She has not been able to go to the gym because they have not open the yoga. But she joined another gym where they have yoga and things she can do to help with balance. She denies sleep apnea. She also have lost some weight. She have been drinking plenty of water. Denies numbness and tingling in fingers and toes. Every once in a while she feels some in her fingers   ALLERGIES:  is allergic to fosamax [alendronate sodium].  MEDICATIONS:  Current Outpatient Medications  Medication Sig Dispense Refill   Ascorbic Acid (VITAMIN C) 500 MG tablet Take 500 mg by mouth 2 (two) times daily.     Calcium Carb-Cholecalciferol 600-800 MG-UNIT TABS Take 1 tablet by mouth 2 (two) times daily.      cetirizine (ZYRTEC) 10 MG tablet Take 10 mg by mouth daily as needed (seasonal allergies).     clonazePAM (KLONOPIN) 2 MG tablet TAKE 1 TABLET(2 MG) BY MOUTH TWICE DAILY AS NEEDED FOR ANXIETY 180 tablet 1   cyclobenzaprine (FLEXERIL) 10 MG tablet TAKE 1 TABLET BY MOUTH THREE TIMES DAILY AS NEEDED FOR MUSCLE SPASMS 90 tablet 5  dorzolamide-timolol (COSOPT) 22.3-6.8 MG/ML ophthalmic solution Place 1 drop into both eyes 2 (two) times daily. 10 mL    fish oil-omega-3 fatty acids 1000 MG capsule Take 1 g by mouth at bedtime.     fluticasone (FLONASE) 50 MCG/ACT nasal spray 2 puffs daily 48 g 3   latanoprost (XALATAN) 0.005 % ophthalmic solution Place 1 drop into both eyes at bedtime.     Multiple Vitamins-Minerals (PRESERVISION AREDS 2 PO)  Take 1 capsule by mouth 2 (two) times daily.      solifenacin (VESICARE) 10 MG tablet TAKE 1 TABLET(10 MG) BY MOUTH DAILY 90 tablet 0   No current facility-administered medications for this visit.    PHYSICAL EXAMINATION: ECOG PERFORMANCE STATUS: 1 - Symptomatic but completely ambulatory  Vitals:   11/03/21 1547  BP: 140/78  Pulse: 75  Resp: 18  Temp: (!) 97.5 F (36.4 C)  SpO2: 99%   Filed Weights   11/03/21 1547  Weight: 174 lb 6.4 oz (79.1 kg)    BREAST: No palpable masses or nodules in either right or left breasts. No palpable axillary supraclavicular or infraclavicular adenopathy no breast tenderness or nipple discharge. (exam performed in the presence of a chaperone)  LABORATORY DATA:  I have reviewed the data as listed    Latest Ref Rng & Units 11/03/2021    3:07 PM 05/28/2020   11:19 AM 05/28/2019   10:00 AM  CMP  Glucose 70 - 99 mg/dL 86  81  95   BUN 8 - 23 mg/dL '10  10  12   ' Creatinine 0.44 - 1.00 mg/dL 0.75  0.68  0.77   Sodium 135 - 145 mmol/L 130  135  137   Potassium 3.5 - 5.1 mmol/L 3.6  4.6  4.7   Chloride 98 - 111 mmol/L 99  101  104   CO2 22 - 32 mmol/L '25  27  26   ' Calcium 8.9 - 10.3 mg/dL 9.4  10.3  9.5   Total Protein 6.5 - 8.1 g/dL 6.9  7.7  6.8   Total Bilirubin 0.3 - 1.2 mg/dL 0.8  0.8  0.9   Alkaline Phos 38 - 126 U/L 57  71  70   AST 15 - 41 U/L '18  25  26   ' ALT 0 - 44 U/L '11  19  29     ' Lab Results  Component Value Date   WBC 7.4 11/03/2021   HGB 12.9 11/03/2021   HCT 37.1 11/03/2021   MCV 86.9 11/03/2021   PLT 207 11/03/2021   NEUTROABS 4.3 11/03/2021    ASSESSMENT & PLAN:  Breast cancer of upper-outer quadrant of right female breast Right breast invasive ductal carcinoma, palpable mass, 5 cm by mammogram and 5.7 cm by MRI grade 3, ER PR HER-2 negative, associated skin dimpling, T3, N0, M0 stage IIB Ki-67 90%   Chemotherapy summary: Neoadjuvant chemotherapy with dose dense Adriamycin and Cytoxan 04/10/2014. followed by weekly  Taxol x1 changed to Abraxane 11 from 06/17/2014 completed 08/26/2014 Rt Lumpectomy: 09/29/14: Path CR 0/2 LN Completed adjuvant radiation therapy 12/18/2014   Breast Cancer Surveillance: 1. Breast exam 11/04/2020: right breast upper quadrant feels tight in the muscle. No palpable nodularity. 2. Mammograms 06/09/2020: Benign , breast density category B 3. Bone density 12/09/2019: T score -1.3 osteopenia (unchanged from before)   Fatigue: Patient exercises regularly but she feels very tired most often. Echocardiogram 11/14/2018: EF 55 to 60%   She continues to stay active and helps take care  of her 59 yr old granddaughter.   RTC in 1 yr for continued surveillance    No orders of the defined types were placed in this encounter.  The patient has a good understanding of the overall plan. she agrees with it. she will call with any problems that may develop before the next visit here. Total time spent: 30 mins including face to face time and time spent for planning, charting and co-ordination of care   Harriette Ohara, MD 11/03/21    I Gardiner Coins am scribing for Dr. Lindi Adie  I have reviewed the above documentation for accuracy and completeness, and I agree with the above.

## 2021-11-07 ENCOUNTER — Telehealth: Payer: Self-pay | Admitting: Hematology and Oncology

## 2021-11-07 NOTE — Telephone Encounter (Signed)
Scheduled appointment per 7/10 los. Patient is aware.

## 2021-11-16 DIAGNOSIS — Z01419 Encounter for gynecological examination (general) (routine) without abnormal findings: Secondary | ICD-10-CM | POA: Diagnosis not present

## 2021-12-18 ENCOUNTER — Other Ambulatory Visit: Payer: Self-pay | Admitting: Family Medicine

## 2022-02-21 DIAGNOSIS — Z23 Encounter for immunization: Secondary | ICD-10-CM | POA: Diagnosis not present

## 2022-02-22 DIAGNOSIS — H401132 Primary open-angle glaucoma, bilateral, moderate stage: Secondary | ICD-10-CM | POA: Diagnosis not present

## 2022-03-07 ENCOUNTER — Other Ambulatory Visit (HOSPITAL_BASED_OUTPATIENT_CLINIC_OR_DEPARTMENT_OTHER): Payer: Self-pay

## 2022-03-07 DIAGNOSIS — Z23 Encounter for immunization: Secondary | ICD-10-CM | POA: Diagnosis not present

## 2022-03-07 MED ORDER — COMIRNATY 30 MCG/0.3ML IM SUSY
PREFILLED_SYRINGE | INTRAMUSCULAR | 0 refills | Status: DC
  Filled 2022-03-07: qty 0.3, 1d supply, fill #0

## 2022-03-15 ENCOUNTER — Other Ambulatory Visit: Payer: Self-pay | Admitting: Family Medicine

## 2022-03-15 NOTE — Telephone Encounter (Signed)
Pt called into the office and would like a refill sent for solifenacin (VESICARE) 10 MG tablet . Pt would like a call when medication is sent off.

## 2022-03-16 NOTE — Telephone Encounter (Signed)
Spoke with patient, prescription was picked up from [facility] on 03/15/22

## 2022-03-28 ENCOUNTER — Ambulatory Visit: Payer: Medicare Other | Admitting: Family Medicine

## 2022-03-29 ENCOUNTER — Encounter: Payer: Self-pay | Admitting: Family Medicine

## 2022-03-29 ENCOUNTER — Ambulatory Visit (INDEPENDENT_AMBULATORY_CARE_PROVIDER_SITE_OTHER): Payer: Medicare Other | Admitting: Family Medicine

## 2022-03-29 VITALS — BP 128/80 | HR 81 | Temp 98.5°F | Wt 170.2 lb

## 2022-03-29 DIAGNOSIS — J40 Bronchitis, not specified as acute or chronic: Secondary | ICD-10-CM

## 2022-03-29 DIAGNOSIS — R0989 Other specified symptoms and signs involving the circulatory and respiratory systems: Secondary | ICD-10-CM

## 2022-03-29 LAB — POCT RAPID STREP A (OFFICE): Rapid Strep A Screen: NEGATIVE

## 2022-03-29 LAB — POCT INFLUENZA A/B
Influenza A, POC: NEGATIVE
Influenza B, POC: NEGATIVE

## 2022-03-29 MED ORDER — FLUCONAZOLE 150 MG PO TABS: 150.0000 mg | ORAL_TABLET | Freq: Every day | ORAL | 5 refills | Status: DC

## 2022-03-29 MED ORDER — DOXYCYCLINE HYCLATE 100 MG PO CAPS: 100.0000 mg | ORAL_CAPSULE | Freq: Two times a day (BID) | ORAL | 0 refills | Status: AC

## 2022-03-29 NOTE — Progress Notes (Signed)
   Subjective:    Patient ID: Stephanie Olsen, female    DOB: [birth year], 76 y.o.   MRN: [id number]  HPI Here for 10 days of ST. PND, chest congestion, and a dry cough. No fever or SOB. Taking Mucinex.    Review of Systems  Constitutional: Negative.   HENT:  Positive for congestion, postnasal drip and sore throat. Negative for ear pain.   Eyes: Negative.   Respiratory:  Positive for cough. Negative for shortness of breath and wheezing.        Objective:   Physical Exam Constitutional:      Appearance: Normal appearance.  HENT:     Right Ear: Tympanic membrane, ear canal and external ear normal.     Left Ear: Tympanic membrane, ear canal and external ear normal.     Nose: Nose normal.     Mouth/Throat:     Pharynx: Oropharynx is clear.  Eyes:     Conjunctiva/sclera: Conjunctivae normal.  Pulmonary:     Effort: Pulmonary effort is normal.     Breath sounds: Rhonchi present. No wheezing or rales.  Lymphadenopathy:     Cervical: No cervical adenopathy.  Neurological:     Mental Status: She is alert.           Assessment & Plan:  Bronchitis, treat with 10 days of Doxycycline.  Alysia Penna, MD

## 2022-04-21 ENCOUNTER — Encounter: Payer: Self-pay | Admitting: Family Medicine

## 2022-04-21 ENCOUNTER — Ambulatory Visit (INDEPENDENT_AMBULATORY_CARE_PROVIDER_SITE_OTHER): Payer: Medicare Other | Admitting: Family Medicine

## 2022-04-21 VITALS — BP 132/84 | HR 80 | Temp 98.4°F | Wt 167.6 lb

## 2022-04-21 DIAGNOSIS — J0111 Acute recurrent frontal sinusitis: Secondary | ICD-10-CM

## 2022-04-21 DIAGNOSIS — R0989 Other specified symptoms and signs involving the circulatory and respiratory systems: Secondary | ICD-10-CM

## 2022-04-21 LAB — POCT INFLUENZA A/B
Influenza A, POC: NEGATIVE
Influenza B, POC: NEGATIVE

## 2022-04-21 LAB — POC COVID19 BINAXNOW: SARS Coronavirus 2 Ag: NEGATIVE

## 2022-04-21 MED ORDER — METHYLPREDNISOLONE ACETATE 40 MG/ML IJ SUSP
40.0000 mg | Freq: Once | INTRAMUSCULAR | Status: AC
  Administered 2022-04-21: 40 mg via INTRAMUSCULAR

## 2022-04-21 MED ORDER — METHYLPREDNISOLONE ACETATE 80 MG/ML IJ SUSP
80.0000 mg | Freq: Once | INTRAMUSCULAR | Status: AC
  Administered 2022-04-21: 80 mg via INTRAMUSCULAR

## 2022-04-21 MED ORDER — AMOXICILLIN-POT CLAVULANATE 875-125 MG PO TABS: 1.0000 | ORAL_TABLET | Freq: Two times a day (BID) | ORAL | 0 refills | Status: DC

## 2022-04-21 NOTE — Progress Notes (Signed)
   Subjective:    Patient ID: Stephanie Olsen, female    DOB: 1946/04/11, 76 y.o.   MRN: [id number]  HPI Here for recurrent symptoms of a sinus infection. We saw her on 11-29 23 for this and treated her with 10 days of Doxycycline. She says she started feeling better for a week or so, but then the symptoms returned. Today she has sinus pressure, PND, and a dry cough. No fever.    Review of Systems  Constitutional: Negative.   HENT:  Positive for congestion, postnasal drip and sinus pressure. Negative for ear pain and sore throat.   Eyes: Negative.   Respiratory:  Positive for cough. Negative for shortness of breath and wheezing.        Objective:   Physical Exam Constitutional:      Appearance: Normal appearance.  HENT:     Right Ear: Tympanic membrane, ear canal and external ear normal.     Left Ear: Tympanic membrane, ear canal and external ear normal.     Nose: Nose normal.     Mouth/Throat:     Pharynx: Oropharynx is clear.  Eyes:     Conjunctiva/sclera: Conjunctivae normal.  Pulmonary:     Effort: Pulmonary effort is normal.     Breath sounds: Normal breath sounds.  Lymphadenopathy:     Cervical: No cervical adenopathy.  Neurological:     Mental Status: She is alert.           Assessment & Plan:  Recurrent sinusitis, treat with 10 days of Augmentin.  Alysia Penna, MD

## 2022-04-22 ENCOUNTER — Ambulatory Visit (HOSPITAL_COMMUNITY)
Admission: EM | Admit: 2022-04-22 | Discharge: 2022-04-22 | Disposition: A | Discharge status: Home / Self Care | Payer: Medicare Other | Attending: Physician Assistant | Admitting: Physician Assistant

## 2022-04-22 ENCOUNTER — Encounter (HOSPITAL_COMMUNITY): Payer: Self-pay

## 2022-04-22 DIAGNOSIS — J0110 Acute frontal sinusitis, unspecified: Secondary | ICD-10-CM

## 2022-04-22 DIAGNOSIS — J069 Acute upper respiratory infection, unspecified: Secondary | ICD-10-CM | POA: Diagnosis not present

## 2022-04-22 MED ORDER — ONDANSETRON HCL 4 MG PO TABS: 4.0000 mg | ORAL_TABLET | Freq: Three times a day (TID) | ORAL | 0 refills | Status: DC | PRN

## 2022-04-22 MED ORDER — AZITHROMYCIN 250 MG PO TABS: ORAL_TABLET | ORAL | 0 refills | Status: DC

## 2022-04-22 NOTE — ED Triage Notes (Signed)
Chief Complaint: sinus pressure, cough, chest congestion, nasal congestion. Was given antibiotics for a sinus infection around thanksgiving. Symptoms got better but returned a week ago. Allergic to the antibiotic given to her yesterday, has been vomiting.   Onset: thanksgiving   Prescriptions or OTC medications tried: Yes- cortisone shot, antibiotics     with little relief  Sick exposure: No  New foods, medications, or products: Yes- antibiotics   Recent Travel: No

## 2022-04-22 NOTE — Discharge Instructions (Signed)
Advised take the Zofran 4 mg every 6 hours to control the nausea and stomach cramping. Advised take the Zithromax 250 mg, 2 tablets initially and then 1 tablet daily until completed as this will treat the remaining sinus infection. Advised to follow-up PCP or return to urgent care if symptoms fail to improve.

## 2022-04-22 NOTE — ED Provider Notes (Signed)
[facility]    CSN: [id number] Arrival date & time: 04/22/22  1013      History   Chief Complaint Chief Complaint  Patient presents with   Sinus Problem   Medication Reaction    HPI Stephanie Olsen is a 76 y.o. female.   76 year old female presents with sinus congestion and nausea.  Patient indicates for the past 2 weeks she has had some upper respiratory congestion with rhinitis and postnasal drip with discolored production.  She also indicates that she is having a lot of frontal and maxillary sinus pain, pressure, and tenderness.  Patient also indicates she has had some mild chest congestion and cough.  Patient indicates she has taken 1 antibiotic course which is doxycycline which improved her infection however it was still lingering.  Patient was given Augmentin 875 and she was not able to tolerate this medicine due to taking the first dose and it causing her a lot of stomach upset, nausea and vomiting.  Patient indicates she is here today to see if she can get a replacement antibiotic but she wants something that we will treat the infection that does not cause her side effects.  Patient also request something to help calm her stomach and prevent her from having nausea.  Patient indicates she is not having any fever or chills.   Sinus Problem    Past Medical History:  Diagnosis Date   Allergy    Anxiety    Breast cancer ([place]) 2016   Right Breast   Breast cancer of upper-outer quadrant of right female breast ([place]) 03/25/2014   CHF (congestive heart failure) ([place])    Chicken pox    Dilated cardiomyopathy ([place])    sees Dr. Haroldine Laws    Glaucoma    sees Dr. Sarita Haver    [id number](026.3)    Hypertension    Neuromuscular disorder ([place])    peripheral neuropathy from chemo   Osteopenia    Osteoporosis    pt states has ostopenia not osteoporosis    Personal history of chemotherapy    2015-2016   Personal history of radiation therapy    2016   Rosacea     S/P radiation therapy 11/11/2014 through 12/18/2014                                                      Right breast 5040 cGy in 28 sessions                          Patient Active Problem List   Diagnosis Date Noted   OAB (overactive bladder) 05/28/2020   Dyslipidemia 09/26/2017   Congestive dilated cardiomyopathy ([place]) 05/07/2014   Snoring 05/07/2014   Breast cancer of upper-outer quadrant of right female breast ([place]) 03/25/2014   Glaucoma 02/25/2013   Anxiety state 10/23/2007   Essential hypertension 10/23/2007   Allergic rhinitis 10/23/2007   ACNE ROSACEA 10/23/2007   Osteoporosis 10/23/2007   Headache 10/23/2007    Past Surgical History:  Procedure Laterality Date   APPENDECTOMY     BREAST LUMPECTOMY Right 09/28/2014   Malignant   BREAST LUMPECTOMY WITH RADIOACTIVE SEED AND SENTINEL LYMPH NODE BIOPSY Right 09/29/2014   Procedure: RIGHT BREAST LUMPECTOMY WITH RADIOACTIVE SEED AND RIGHT AXILLARY SENTINEL LYMPH NODE BIOPSY;  Surgeon: Excell Seltzer, MD;  Location: [facility];  Service: General;  Laterality: Right;   cataract surgery on both eyes     12 years ago   COLONOSCOPY  06/2016   per Dr. Earlean Shawl, adenomatous polyps,  repeat in 5 yrs   EYE SURGERY     laser per right eye related to pressure relief; past cataract surgery bilat    laser of left eye     1 week ago   PORT-A-CATH REMOVAL Left 09/29/2014   Procedure: REMOVAL PORT-A-CATH;  Surgeon: Excell Seltzer, MD;  Location: [facility];  Service: General;  Laterality: Left;   PORTACATH PLACEMENT N/A 04/07/2014   Procedure: INSERTION PORT-A-CATH;  Surgeon: Excell Seltzer, MD;  Location: [facility];  Service: General;  Laterality: N/A;    OB History   No obstetric history on file.      Home Medications    Prior to Admission medications   Medication Sig Start Date End Date Taking? Authorizing Provider  amoxicillin-clavulanate (AUGMENTIN) 875-125 MG tablet Take 1 tablet by mouth 2  (two) times daily. 04/21/22  Yes Laurey Morale, MD  Ascorbic Acid (VITAMIN C) 500 MG tablet Take 500 mg by mouth 2 (two) times daily.   Yes [provider]  azithromycin (ZITHROMAX Z-PAK) 250 MG tablet 2 tablets initially, then 1 daily until medication completed. 04/22/22  Yes Nyoka Lint, PA-C  Calcium Carb-Cholecalciferol 600-800 MG-UNIT TABS Take 1 tablet by mouth 2 (two) times daily.    Yes [provider]  cetirizine (ZYRTEC) 10 MG tablet Take 10 mg by mouth daily as needed (seasonal allergies).   Yes [provider]  clonazePAM (KLONOPIN) 2 MG tablet TAKE 1 TABLET(2 MG) BY MOUTH TWICE DAILY AS NEEDED FOR ANXIETY 03/05/20  Yes Laurey Morale, MD  COVID-19 mRNA vaccine [phone] (COMIRNATY) syringe Inject into the muscle. 03/07/22  Yes   cyclobenzaprine (FLEXERIL) 10 MG tablet TAKE 1 TABLET BY MOUTH THREE TIMES DAILY AS NEEDED FOR MUSCLE SPASMS 03/05/20  Yes Laurey Morale, MD  dorzolamide-timolol (COSOPT) 22.3-6.8 MG/ML ophthalmic solution Place 1 drop into both eyes 2 (two) times daily. 11/05/19  Yes Nicholas Lose, MD  fish oil-omega-3 fatty acids 1000 MG capsule Take 1 g by mouth at bedtime.   Yes [provider]  fluticasone Asencion Islam) 50 MCG/ACT nasal spray 2 puffs daily 06/07/20  Yes Laurey Morale, MD  latanoprost (XALATAN) 0.005 % ophthalmic solution Place 1 drop into both eyes at bedtime.   Yes [provider]  Multiple Vitamins-Minerals (PRESERVISION AREDS 2 PO) Take 1 capsule by mouth 2 (two) times daily.    Yes [provider]  ondansetron (ZOFRAN) 4 MG tablet Take 1 tablet (4 mg total) by mouth every 8 (eight) hours as needed for nausea or vomiting. 04/22/22  Yes Nyoka Lint, PA-C  solifenacin (VESICARE) 10 MG tablet TAKE 1 TABLET(10 MG) BY MOUTH DAILY 03/15/22  Yes Laurey Morale, MD    Family History Family History  Problem Relation Age of Onset   Lung cancer Maternal Uncle        heavy smoker   Colon cancer Paternal  Grandmother 57   Lung cancer Cousin        smoker   Leukemia Cousin 3   Arthritis Other    Coronary artery disease Other    Hypertension Other    Stroke Other    Macular degeneration Other     Social History Social History   Tobacco Use   Smoking status: Former  Packs/day: 1.00    Years: 10.00    Total pack years: 10.00    Types: Cigarettes    Quit date: 05/02/1975    Years since quitting: 47.0   Smokeless tobacco: Never  Vaping Use   Vaping Use: Never used  Substance Use Topics   Alcohol use: Yes    Alcohol/week: 2.0 standard drinks of alcohol    Types: 2 Standard drinks or equivalent per week    Comment: weekends-wine   Drug use: No     Allergies   Amoxicillin-pot clavulanate and Fosamax [alendronate sodium]   Review of Systems Review of Systems  HENT:  Positive for postnasal drip, sinus pressure and sinus pain.      Physical Exam Triage Vital Signs ED Triage Vitals  Enc Vitals Group     BP 04/22/22 1120 (!) 150/85     Pulse Rate 04/22/22 1120 79     Resp 04/22/22 1120 16     Temp 04/22/22 1120 97.9 F (36.6 C)     Temp Source 04/22/22 1120 Oral     SpO2 04/22/22 1120 98 %     Weight --      Height --      Head Circumference --      Peak Flow --      Pain Score 04/22/22 1117 5     Pain Loc --      Pain Edu? --      Excl. in [place]? --    No data found.  Updated Vital Signs BP 136/74 (BP Location: Left Arm)   Pulse 79   Temp 97.9 F (36.6 C) (Oral)   Resp 16   SpO2 98%   Visual Acuity Right Eye Distance:   Left Eye Distance:   Bilateral Distance:    Right Eye Near:   Left Eye Near:    Bilateral Near:     Physical Exam Constitutional:      Appearance: Normal appearance.  HENT:     Right Ear: Tympanic membrane and ear canal normal.     Left Ear: Tympanic membrane and ear canal normal.     Mouth/Throat:     Mouth: Mucous membranes are moist.     Pharynx: Oropharynx is clear. No posterior oropharyngeal erythema.  Cardiovascular:      Rate and Rhythm: Normal rate and regular rhythm.     Heart sounds: Normal heart sounds.  Pulmonary:     Effort: Pulmonary effort is normal.     Breath sounds: Normal breath sounds and air entry. No wheezing, rhonchi or rales.  Lymphadenopathy:     Cervical: No cervical adenopathy.  Neurological:     Mental Status: She is alert.      UC Treatments / Results  Labs (all labs ordered are listed, but only abnormal results are displayed) Labs Reviewed - No data to display  EKG   Radiology No results found.  Procedures Procedures (including critical care time)  Medications Ordered in UC Medications - No data to display  Initial Impression / Assessment and Plan / UC Course  I have reviewed the triage vital signs and the nursing notes.  Pertinent labs & imaging results that were available during my care of the patient were reviewed by me and considered in my medical decision making (see chart for details).    Plan: 1.  The acute sinusitis will be treated with the following: A.  Zithromax 250 mg, 2 tablets initially and then 1 daily until medication is completed. 2.  The acute respiratory infection will be treated with the following: A.  Patient advised to continue using OTC medications for upper respiratory congestion. 3.  The nausea be treated with the following: A.  The Zofran 4 mg every 6 hours to help control stomach cramping and nausea. 4.  Patient advised follow-up PCP or return to urgent care if symptoms fail to improve. Final Clinical Impressions(s) / UC Diagnoses   Final diagnoses:  Acute upper respiratory infection  Acute non-recurrent frontal sinusitis     Discharge Instructions      Advised take the Zofran 4 mg every 6 hours to control the nausea and stomach cramping. Advised take the Zithromax 250 mg, 2 tablets initially and then 1 tablet daily until completed as this will treat the remaining sinus infection. Advised to follow-up PCP or return to urgent  care if symptoms fail to improve.    ED Prescriptions     Medication Sig Dispense Auth. Provider   azithromycin (ZITHROMAX Z-PAK) 250 MG tablet 2 tablets initially, then 1 daily until medication completed. 6 each Nyoka Lint, PA-C   ondansetron (ZOFRAN) 4 MG tablet Take 1 tablet (4 mg total) by mouth every 8 (eight) hours as needed for nausea or vomiting. 20 tablet Nyoka Lint, PA-C      PDMP not reviewed this encounter.   Nyoka Lint, PA-C 04/22/22 1159

## 2022-04-28 ENCOUNTER — Telehealth: Payer: Self-pay | Admitting: Family Medicine

## 2022-04-28 MED ORDER — CLONAZEPAM 2 MG PO TABS: ORAL_TABLET | ORAL | 1 refills | Status: DC

## 2022-04-28 NOTE — Telephone Encounter (Signed)
Called patient both prescriptions were sent to [facility].

## 2022-04-28 NOTE — Telephone Encounter (Signed)
Pt called to FU on this refill request. Wanted to make sure it was taken care of by the end of day because she knows we are closed on Monday.

## 2022-04-28 NOTE — Telephone Encounter (Signed)
Last Ov-04/21/22 Last refill-03/21/20--90 tabs, 5 refills  No future OV scheduled.

## 2022-04-28 NOTE — Telephone Encounter (Signed)
Pharmacy updated.

## 2022-04-28 NOTE — Telephone Encounter (Signed)
Patient needs a refill on Clonazepam and Cyclobenzaprine.  Patient states that Dr. Sarajane Jews knows that she doesn't take it very often and cuts the Clonazepam in half.  Pharmacy states she needs a new prescription since she hasn't had a refill since 2021. Has 1 Cyclobenzaprine left and a few Clonazepam left.  Pt wants to try to get it this year since it is cheaper on the insurance than it is next year.   Pharmacy-  [facility] on [address]

## 2022-04-28 NOTE — Telephone Encounter (Signed)
Called patient both prescriptions were sent to [facility]

## 2022-05-11 ENCOUNTER — Other Ambulatory Visit: Payer: Self-pay | Admitting: Hematology and Oncology

## 2022-05-11 DIAGNOSIS — Z1231 Encounter for screening mammogram for malignant neoplasm of breast: Secondary | ICD-10-CM

## 2022-05-17 ENCOUNTER — Ambulatory Visit (INDEPENDENT_AMBULATORY_CARE_PROVIDER_SITE_OTHER): Payer: Medicare Other

## 2022-05-17 VITALS — Ht 65.0 in | Wt 167.0 lb

## 2022-05-17 DIAGNOSIS — Z0000 Encounter for general adult medical examination without abnormal findings: Secondary | ICD-10-CM

## 2022-05-17 NOTE — Patient Instructions (Addendum)
Stephanie Olsen , Thank you for taking time to come for your Medicare Wellness Visit. I appreciate your ongoing commitment to your health goals. Please review the following plan we discussed and let me know if I can assist you in the future.   These are the goals we discussed:  Goals       Exercise 150 minutes per week (moderate activity)      Re-engage in exercise as tolerated Enjoy your yoga.      Increase physical activity (pt-stated)      Patient Stated      I would like to find a gym that allows me to do Yoga!        This is a list of the screening recommended for you and due dates:  Health Maintenance  Topic Date Due   Flu Shot  07/30/2022*   Zoster (Shingles) Vaccine (1 of 2) 08/16/2022*   Pneumonia Vaccine (2 - PPSV23 or PCV20) 05/18/2023*   Medicare Annual Wellness Visit  05/18/2023   DTaP/Tdap/Td vaccine (3 - Td or Tdap) 10/31/2025   DEXA scan (bone density measurement)  Completed   Hepatitis C Screening: USPSTF Recommendation to screen - Ages 51-79 yo.  Completed   HPV Vaccine  Aged Out   Colon Cancer Screening  Discontinued   COVID-19 Vaccine  Discontinued  *Topic was postponed. The date shown is not the original due date.    Advanced directives: Advance directive discussed with you today. Even though you declined this today, please call our office should you change your mind, and we can give you the proper paperwork for you to fill out.   Conditions/risks identified: None  Next appointment: Follow up in one year for your annual wellness visit     Preventive Care 65 Years and Older, Female Preventive care refers to lifestyle choices and visits with your health care provider that can promote health and wellness. What does preventive care include? A yearly physical exam. This is also called an annual well check. Dental exams once or twice a year. Routine eye exams. Ask your health care provider how often you should have your eyes checked. Personal lifestyle  choices, including: Daily care of your teeth and gums. Regular physical activity. Eating a healthy diet. Avoiding tobacco and drug use. Limiting alcohol use. Practicing safe sex. Taking low-dose aspirin every day. Taking vitamin and mineral supplements as recommended by your health care provider. What happens during an annual well check? The services and screenings done by your health care provider during your annual well check will depend on your age, overall health, lifestyle risk factors, and family history of disease. Counseling  Your health care provider may ask you questions about your: Alcohol use. Tobacco use. Drug use. Emotional well-being. Home and relationship well-being. Sexual activity. Eating habits. History of falls. Memory and ability to understand (cognition). Work and work Statistician. Reproductive health. Screening  You may have the following tests or measurements: Height, weight, and BMI. Blood pressure. Lipid and cholesterol levels. These may be checked every 5 years, or more frequently if you are over 67 years old. Skin check. Lung cancer screening. You may have this screening every year starting at age 59 if you have a 30-pack-year history of smoking and currently smoke or have quit within the past 15 years. Fecal occult blood test (FOBT) of the stool. You may have this test every year starting at age 79. Flexible sigmoidoscopy or colonoscopy. You may have a sigmoidoscopy every 5 years or a colonoscopy  every 10 years starting at age 58. Hepatitis C blood test. Hepatitis B blood test. Sexually transmitted disease (STD) testing. Diabetes screening. This is done by checking your blood sugar (glucose) after you have not eaten for a while (fasting). You may have this done every 1-3 years. Bone density scan. This is done to screen for osteoporosis. You may have this done starting at age 36. Mammogram. This may be done every 1-2 years. Talk to your health care  provider about how often you should have regular mammograms. Talk with your health care provider about your test results, treatment options, and if necessary, the need for more tests. Vaccines  Your health care provider may recommend certain vaccines, such as: Influenza vaccine. This is recommended every year. Tetanus, diphtheria, and acellular pertussis (Tdap, Td) vaccine. You may need a Td booster every 10 years. Zoster vaccine. You may need this after age 44. Pneumococcal 13-valent conjugate (PCV13) vaccine. One dose is recommended after age 64. Pneumococcal polysaccharide (PPSV23) vaccine. One dose is recommended after age 61. Talk to your health care provider about which screenings and vaccines you need and how often you need them. This information is not intended to replace advice given to you by your health care provider. Make sure you discuss any questions you have with your health care provider. Document Released: 05/14/2015 Document Revised: 01/05/2016 Document Reviewed: 02/16/2015 Elsevier Interactive Patient Education  2017 [place] Prevention in the Home Falls can cause injuries. They can happen to people of all ages. There are many things you can do to make your home safe and to help prevent falls. What can I do on the outside of my home? Regularly fix the edges of walkways and driveways and fix any cracks. Remove anything that might make you trip as you walk through a door, such as a raised step or threshold. Trim any bushes or trees on the path to your home. Use bright outdoor lighting. Clear any walking paths of anything that might make someone trip, such as rocks or tools. Regularly check to see if handrails are loose or broken. Make sure that both sides of any steps have handrails. Any raised decks and porches should have guardrails on the edges. Have any leaves, snow, or ice cleared regularly. Use sand or salt on walking paths during winter. Clean up any  spills in your garage right away. This includes oil or grease spills. What can I do in the bathroom? Use night lights. Install grab bars by the toilet and in the tub and shower. Do not use towel bars as grab bars. Use non-skid mats or decals in the tub or shower. If you need to sit down in the shower, use a plastic, non-slip stool. Keep the floor dry. Clean up any water that spills on the floor as soon as it happens. Remove soap buildup in the tub or shower regularly. Attach bath mats securely with double-sided non-slip rug tape. Do not have throw rugs and other things on the floor that can make you trip. What can I do in the bedroom? Use night lights. Make sure that you have a light by your bed that is easy to reach. Do not use any sheets or blankets that are too big for your bed. They should not hang down onto the floor. Have a firm chair that has side arms. You can use this for support while you get dressed. Do not have throw rugs and other things on the floor that can make  you trip. What can I do in the kitchen? Clean up any spills right away. Avoid walking on wet floors. Keep items that you use a lot in easy-to-reach places. If you need to reach something above you, use a strong step stool that has a grab bar. Keep electrical cords out of the way. Do not use floor polish or wax that makes floors slippery. If you must use wax, use non-skid floor wax. Do not have throw rugs and other things on the floor that can make you trip. What can I do with my stairs? Do not leave any items on the stairs. Make sure that there are handrails on both sides of the stairs and use them. Fix handrails that are broken or loose. Make sure that handrails are as long as the stairways. Check any carpeting to make sure that it is firmly attached to the stairs. Fix any carpet that is loose or worn. Avoid having throw rugs at the top or bottom of the stairs. If you do have throw rugs, attach them to the floor  with carpet tape. Make sure that you have a light switch at the top of the stairs and the bottom of the stairs. If you do not have them, ask someone to add them for you. What else can I do to help prevent falls? Wear shoes that: Do not have high heels. Have rubber bottoms. Are comfortable and fit you well. Are closed at the toe. Do not wear sandals. If you use a stepladder: Make sure that it is fully opened. Do not climb a closed stepladder. Make sure that both sides of the stepladder are locked into place. Ask someone to hold it for you, if possible. Clearly mark and make sure that you can see: Any grab bars or handrails. First and last steps. Where the edge of each step is. Use tools that help you move around (mobility aids) if they are needed. These include: Canes. Walkers. Scooters. Crutches. Turn on the lights when you go into a dark area. Replace any light bulbs as soon as they burn out. Set up your furniture so you have a clear path. Avoid moving your furniture around. If any of your floors are uneven, fix them. If there are any pets around you, be aware of where they are. Review your medicines with your doctor. Some medicines can make you feel dizzy. This can increase your chance of falling. Ask your doctor what other things that you can do to help prevent falls. This information is not intended to replace advice given to you by your health care provider. Make sure you discuss any questions you have with your health care provider. Document Released: 02/11/2009 Document Revised: 09/23/2015 Document Reviewed: 05/22/2014 Elsevier Interactive Patient Education  2017 [employer].

## 2022-05-17 NOTE — Progress Notes (Signed)
Subjective:   Stephanie Olsen is a 77 y.o. female who presents for Medicare Annual (Subsequent) preventive examination.  Review of Systems    Virtual Visit via Telephone Note  I connected with  Stephanie Olsen on 05/17/22 at  2:30 PM EST by telephone and verified that I am speaking with the correct person using two identifiers.  Location: Patient: Home Provider: Office Persons participating in the virtual visit: patient/Nurse Health Advisor   I discussed the limitations, risks, security and privacy concerns of performing an evaluation and management service by telephone and the availability of in person appointments. The patient expressed understanding and agreed to proceed.  Interactive audio and video telecommunications were attempted between this nurse and patient, however failed, due to patient having technical difficulties OR patient did not have access to video capability.  We continued and completed visit with audio only.  Some vital signs may be absent or patient reported.   Criselda Peaches, LPN  Cardiac Risk Factors include: advanced age (>34mn, >>17women);hypertension     Objective:    Today's Vitals   05/17/22 1415  Weight: 167 lb (75.8 kg)  Height: '5\' 5"'$  (1.651 m)   Body mass index is 27.79 kg/m.     05/17/2022    2:23 PM 05/12/2021    1:15 PM 05/07/2020    9:28 AM 05/06/2019    9:26 AM 09/19/2017    3:01 PM 11/06/2016    2:46 PM 06/19/2016    9:42 AM  Advanced Directives  Does Patient Have a Medical Advance Directive? No No No No No No No  Would patient like information on creating a medical advance directive? No - Patient declined No - Patient declined No - Patient declined No - Patient declined       Current Medications (verified) Outpatient Encounter Medications as of 05/17/2022  Medication Sig   amoxicillin-clavulanate (AUGMENTIN) 875-125 MG tablet Take 1 tablet by mouth 2 (two) times daily.   Ascorbic Acid (VITAMIN C) 500 MG tablet Take 500 mg by  mouth 2 (two) times daily.   azithromycin (ZITHROMAX Z-PAK) 250 MG tablet 2 tablets initially, then 1 daily until medication completed.   Calcium Carb-Cholecalciferol 600-800 MG-UNIT TABS Take 1 tablet by mouth 2 (two) times daily.    cetirizine (ZYRTEC) 10 MG tablet Take 10 mg by mouth daily as needed (seasonal allergies).   clonazePAM (KLONOPIN) 2 MG tablet TAKE 1 TABLET(2 MG) BY MOUTH TWICE DAILY AS NEEDED FOR ANXIETY   COVID-19 mRNA vaccine 2023-2024 (COMIRNATY) syringe Inject into the muscle.   cyclobenzaprine (FLEXERIL) 10 MG tablet TAKE 1 TABLET BY MOUTH THREE TIMES DAILY AS NEEDED FOR MUSCLE SPASMS   dorzolamide-timolol (COSOPT) 22.3-6.8 MG/ML ophthalmic solution Place 1 drop into both eyes 2 (two) times daily.   fish oil-omega-3 fatty acids 1000 MG capsule Take 1 g by mouth at bedtime.   fluticasone (FLONASE) 50 MCG/ACT nasal spray 2 puffs daily   latanoprost (XALATAN) 0.005 % ophthalmic solution Place 1 drop into both eyes at bedtime.   Multiple Vitamins-Minerals (PRESERVISION AREDS 2 PO) Take 1 capsule by mouth 2 (two) times daily.    ondansetron (ZOFRAN) 4 MG tablet Take 1 tablet (4 mg total) by mouth every 8 (eight) hours as needed for nausea or vomiting.   solifenacin (VESICARE) 10 MG tablet TAKE 1 TABLET(10 MG) BY MOUTH DAILY   No facility-administered encounter medications on file as of 05/17/2022.    Allergies (verified) Amoxicillin-pot clavulanate and Fosamax [alendronate sodium]   History:  Past Medical History:  Diagnosis Date   Allergy    Anxiety    Breast cancer ([place]) 2016   Right Breast   Breast cancer of upper-outer quadrant of right female breast ([place]) 03/25/2014   CHF (congestive heart failure) ([place])    Chicken pox    Dilated cardiomyopathy ([place])    sees Dr. Haroldine Laws    Glaucoma    sees Dr. Sarita Haver    IOEVOJJK(093.8)    Hypertension    Neuromuscular disorder ([place])    peripheral neuropathy from chemo   Osteopenia    Osteoporosis    pt states has  ostopenia not osteoporosis    Personal history of chemotherapy    2015-2016   Personal history of radiation therapy    2016   Rosacea    S/P radiation therapy 11/11/2014 through 12/18/2014                                                      Right breast 5040 cGy in 28 sessions                         Past Surgical History:  Procedure Laterality Date   APPENDECTOMY     BREAST LUMPECTOMY Right 09/28/2014   Malignant   BREAST LUMPECTOMY WITH RADIOACTIVE SEED AND SENTINEL LYMPH NODE BIOPSY Right 09/29/2014   Procedure: RIGHT BREAST LUMPECTOMY WITH RADIOACTIVE SEED AND RIGHT AXILLARY SENTINEL LYMPH NODE BIOPSY;  Surgeon: Excell Seltzer, MD;  Location: [facility];  Service: General;  Laterality: Right;   cataract surgery on both eyes     12 years ago   COLONOSCOPY  06/2016   per Dr. Earlean Shawl, adenomatous polyps,  repeat in 5 yrs   EYE SURGERY     laser per right eye related to pressure relief; past cataract surgery bilat    laser of left eye     1 week ago   PORT-A-CATH REMOVAL Left 09/29/2014   Procedure: REMOVAL PORT-A-CATH;  Surgeon: Excell Seltzer, MD;  Location: [facility];  Service: General;  Laterality: Left;   PORTACATH PLACEMENT N/A 04/07/2014   Procedure: INSERTION PORT-A-CATH;  Surgeon: Excell Seltzer, MD;  Location: [facility];  Service: General;  Laterality: N/A;   Family History  Problem Relation Age of Onset   Lung cancer Maternal Uncle        heavy smoker   Colon cancer Paternal Grandmother 2   Lung cancer Cousin        smoker   Leukemia Cousin 3   Arthritis Other    Coronary artery disease Other    Hypertension Other    Stroke Other    Macular degeneration Other    Social History   Socioeconomic History   Marital status: Widowed    Spouse name: Not on file   Number of children: 4   Years of education: 14   Highest education level: Associate degree: occupational, Hotel manager, or vocational program  Occupational History    Occupation: retired  Tobacco Use   Smoking status: Former    Packs/day: 1.00    Years: 10.00    Total pack years: 10.00    Types: Cigarettes    Quit date: 05/02/1975    Years since quitting: 47.0   Smokeless tobacco: Never  Vaping Use   Vaping  Use: Never used  Substance and Sexual Activity   Alcohol use: Yes    Alcohol/week: 2.0 standard drinks of alcohol    Types: 2 Standard drinks or equivalent per week    Comment: weekends-wine   Drug use: No   Sexual activity: Yes  Other Topics Concern   Not on file  Social History Narrative   Widowed   HH-1   Retired    2 cats   Social Determinants of [place] Strain: [place]  (05/17/2022)   Overall Financial Resource Strain (CARDIA)    Difficulty of Paying Living Expenses: Not hard at all  Food Insecurity: No Food Insecurity (05/17/2022)   Hunger Vital Sign    Worried About Running Out of Food in the Last Year: Never true    [place] in the Last Year: Never true  Transportation Needs: No Transportation Needs (05/17/2022)   PRAPARE - Hydrologist (Medical): No    Lack of Transportation (Non-Medical): No  Physical Activity: Sufficiently Active (05/17/2022)   Exercise Vital Sign    Days of Exercise per Week: 5 days    Minutes of Exercise per Session: 60 min  Stress: No Stress Concern Present (05/17/2022)   [place]    Feeling of Stress : Not at all  Social Connections: Moderately Integrated (05/17/2022)   Social Connection and Isolation Panel [NHANES]    Frequency of Communication with Friends and Family: More than three times a week    Frequency of Social Gatherings with Friends and Family: More than three times a week    Attends Religious Services: More than 4 times per year    Active Member of [employer] or Organizations: Yes    Attends Archivist Meetings: More than 4 times per year    Marital Status:  Widowed    Tobacco Counseling Counseling given: Not Answered   Clinical Intake:  Pre-visit preparation completed: No  Pain : No/denies pain     BMI - recorded: 27.79 Nutritional Status: BMI 25 -29 Overweight Nutritional Risks: None Diabetes: No  How often do you need to have someone help you when you read instructions, pamphlets, or other written materials from your doctor or pharmacy?: 1 - Never  Diabetic?  No  Interpreter Needed?: No  Information entered by :: Rolene Arbour LPN   Activities of Daily Living    05/17/2022    2:22 PM  In your present state of health, do you have any difficulty performing the following activities:  Hearing? 0  Vision? 0  Difficulty concentrating or making decisions? 0  Walking or climbing stairs? 0  Dressing or bathing? 0  Doing errands, shopping? 0  Preparing Food and eating ? N  Using the Toilet? N  In the past six months, have you accidently leaked urine? N  Do you have problems with loss of bowel control? N  Managing your Medications? N  Managing your Finances? N  Housekeeping or managing your Housekeeping? N    Patient Care Team: Laurey Morale, MD as PCP - General (Family Medicine) Excell Seltzer, MD (Inactive) as Consulting Physician (General Surgery) Nicholas Lose, MD as Consulting Physician (Hematology and Oncology) Arloa Koh, MD (Inactive) as Consulting Physician (Radiation Oncology) Holley Bouche, NP (Inactive) as Nurse Practitioner (Nurse Practitioner) Sylvan Cheese, NP as Nurse Practitioner (Hematology and Oncology)  Indicate any recent Medical Services you may have received from other than  [facility] providers in the past year (date may be approximate).     Assessment:   This is a routine wellness examination for Stephanie Olsen.  Hearing/Vision screen Hearing Screening - Comments:: Denies hearing difficulties   Vision Screening - Comments:: Wears rx glasses - up to date with routine eye exams with   Dr Satira Sark  Dietary issues and exercise activities discussed: Exercise limited by: None identified   Goals Addressed               This Visit's Progress     Increase physical activity (pt-stated)         Depression Screen    05/17/2022    2:20 PM 05/12/2021    1:09 PM 05/07/2020    9:31 AM 05/06/2019    9:28 AM 09/19/2017    3:01 PM 06/19/2016    9:48 AM 01/19/2015   10:38 AM  PHQ 2/9 Scores  PHQ - 2 Score 0 0 0 0 0 0 0  PHQ- 9 Score   0        Fall Risk    05/17/2022    2:22 PM 05/12/2021    1:11 PM 05/07/2020    9:29 AM 05/06/2019    9:28 AM 09/19/2017    3:01 PM  Fall Risk   Falls in the past year? 0 0 0 0 No  Number falls in past yr: 0 0 0 0   Injury with Fall? 0 0 0    Risk for fall due to : No Fall Risks  Impaired balance/gait Medication side effect   Follow up Falls prevention discussed  Falls evaluation completed;Falls prevention discussed Falls evaluation completed;Education provided;Falls prevention discussed     FALL RISK PREVENTION PERTAINING TO THE HOME:  Any stairs in or around the home? Yes  If so, are there any without handrails? No  Home free of loose throw rugs in walkways, pet beds, electrical cords, etc? Yes  Adequate lighting in your home to reduce risk of falls? Yes   ASSISTIVE DEVICES UTILIZED TO PREVENT FALLS:  Life alert? No  Use of a cane, walker or w/c? No  Grab bars in the bathroom? Yes  Shower chair or bench in shower? Yes  Elevated toilet seat or a handicapped toilet? Yes   TIMED UP AND GO:  Was the test performed? No . Audio Visit   Cognitive Function:        05/17/2022    2:23 PM 05/12/2021    1:12 PM 05/07/2020    9:35 AM 05/06/2019    9:22 AM  6CIT Screen  What Year? 0 points 0 points 0 points 0 points  What month? 0 points 0 points 0 points 0 points  What time? 0 points 0 points 0 points 0 points  Count back from 20 0 points 0 points 0 points 0 points  Months in reverse 0 points 0 points 0 points 0 points  Repeat phrase 0  points 0 points 0 points 0 points  Total Score 0 points 0 points 0 points 0 points    Immunizations Immunization History  Administered Date(s) Administered   COVID-19, mRNA, vaccine(Comirnaty)12 years and older 03/07/2022   Influenza Whole 02/16/2009, 02/21/2010   Influenza, High Dose Seasonal PF 03/13/2016, 02/21/2017   Influenza,inj,Quad PF,6+ Mos 02/25/2013, 02/12/2014, 01/30/2019   Influenza-Unspecified 02/28/2015, 03/13/2016, 02/21/2017, 01/29/2018, 02/06/2019, 03/02/2020   PFIZER(Purple Top)SARS-COV-2 Vaccination 05/22/2019, 06/11/2019   Pneumococcal Conjugate-13 05/18/2015, 07/30/2019   Tdap 02/12/2014, 11/01/2015   Unspecified SARS-COV-2 Vaccination 05/02/2019, 06/02/2019  Zoster, Live 05/01/2008    TDAP status: Up to date  Flu Vaccine status: Up to date  Pneumococcal vaccine status: Due, Education has been provided regarding the importance of this vaccine. Advised may receive this vaccine at local pharmacy or Health Dept. Aware to provide a copy of the vaccination record if obtained from local pharmacy or Health Dept. Verbalized acceptance and understanding.  Covid-19 vaccine status: Completed vaccines  Qualifies for Shingles Vaccine? Yes   Zostavax completed No   Shingrix Completed?: No.    Education has been provided regarding the importance of this vaccine. Patient has been advised to call insurance company to determine out of pocket expense if they have not yet received this vaccine. Advised may also receive vaccine at local pharmacy or Health Dept. Verbalized acceptance and understanding.  Screening Tests Health Maintenance  Topic Date Due   INFLUENZA VACCINE  07/30/2022 (Originally 11/29/2021)   Zoster Vaccines- Shingrix (1 of 2) 08/16/2022 (Originally 05/08/1964)   Pneumonia Vaccine 60+ Years old (2 - PPSV23 or PCV20) 05/18/2023 (Originally 07/29/2020)   Medicare Annual Wellness (AWV)  05/18/2023   DTaP/Tdap/Td (3 - Td or Tdap) 10/31/2025   DEXA SCAN  Completed    Hepatitis C Screening  Completed   HPV VACCINES  Aged Out   COLONOSCOPY (Pts 45-11yr Insurance coverage will need to be confirmed)  Discontinued   COVID-19 Vaccine  Discontinued    Health Maintenance  There are no preventive care reminders to display for this patient.   Colorectal cancer screening: No longer required.   Mammogram status: No longer required due to Age.  Bone Density status: Completed 12/18/19. Results reflect: Bone density results: OSTEOPOROSIS. Repeat every   years.  Lung Cancer Screening: (Low Dose CT Chest recommended if Age 386-80years, 30 pack-year currently smoking OR have quit w/in 15years.) does not qualify.     Additional Screening:  Hepatitis C Screening: does qualify; Completed 07/02/16  Vision Screening: Recommended annual ophthalmology exams for early detection of glaucoma and other disorders of the eye. Is the patient up to date with their annual eye exam?  Yes  Who is the provider or what is the name of the office in which the patient attends annual eye exams? Dr TSatira SarkIf pt is not established with a provider, would they like to be referred to a provider to establish care? No .   Dental Screening: Recommended annual dental exams for proper oral hygiene  Community Resource Referral / Chronic Care Management:  CRR required this visit?  No   CCM required this visit?  No      Plan:     I have personally reviewed and noted the following in the patient's chart:   Medical and social history Use of alcohol, tobacco or illicit drugs  Current medications and supplements including opioid prescriptions. Patient is not currently taking opioid prescriptions. Functional ability and status Nutritional status Physical activity Advanced directives List of other physicians Hospitalizations, surgeries, and ER visits in previous 12 months Vitals Screenings to include cognitive, depression, and falls Referrals and appointments  In addition, I have  reviewed and discussed with patient certain preventive protocols, quality metrics, and best practice recommendations. A written personalized care plan for preventive services as well as general preventive health recommendations were provided to patient.     BCriselda Peaches LPN   [id number]  Nurse Notes: None

## 2022-06-10 ENCOUNTER — Other Ambulatory Visit: Payer: Self-pay | Admitting: Family Medicine

## 2022-06-14 ENCOUNTER — Other Ambulatory Visit (HOSPITAL_BASED_OUTPATIENT_CLINIC_OR_DEPARTMENT_OTHER): Payer: Self-pay

## 2022-06-14 MED ORDER — AREXVY 120 MCG/0.5ML IM SUSR
INTRAMUSCULAR | 0 refills | Status: DC
  Filled 2022-06-14: qty 0.5, 1d supply, fill #0

## 2022-06-19 ENCOUNTER — Ambulatory Visit
Admission: RE | Admit: 2022-06-19 | Discharge: 2022-06-19 | Disposition: A | Discharge status: Home / Self Care | Payer: Medicare Other | Source: Ambulatory Visit | Attending: Hematology and Oncology | Admitting: Hematology and Oncology

## 2022-06-19 DIAGNOSIS — Z1231 Encounter for screening mammogram for malignant neoplasm of breast: Secondary | ICD-10-CM

## 2022-06-22 ENCOUNTER — Other Ambulatory Visit: Payer: Self-pay | Admitting: Hematology and Oncology

## 2022-06-22 DIAGNOSIS — R928 Other abnormal and inconclusive findings on diagnostic imaging of breast: Secondary | ICD-10-CM

## 2022-06-28 DIAGNOSIS — H04123 Dry eye syndrome of bilateral lacrimal glands: Secondary | ICD-10-CM | POA: Diagnosis not present

## 2022-06-28 DIAGNOSIS — H401132 Primary open-angle glaucoma, bilateral, moderate stage: Secondary | ICD-10-CM | POA: Diagnosis not present

## 2022-07-04 ENCOUNTER — Other Ambulatory Visit: Payer: Medicare Other

## 2022-07-11 ENCOUNTER — Ambulatory Visit
Admission: RE | Admit: 2022-07-11 | Discharge: 2022-07-11 | Disposition: A | Discharge status: Home / Self Care | Payer: Medicare Other | Source: Ambulatory Visit | Attending: Hematology and Oncology | Admitting: Hematology and Oncology

## 2022-07-11 ENCOUNTER — Ambulatory Visit: Payer: Medicare Other

## 2022-07-11 DIAGNOSIS — N6489 Other specified disorders of breast: Secondary | ICD-10-CM | POA: Diagnosis not present

## 2022-07-11 DIAGNOSIS — R928 Other abnormal and inconclusive findings on diagnostic imaging of breast: Secondary | ICD-10-CM

## 2022-07-18 ENCOUNTER — Other Ambulatory Visit: Payer: Medicare Other

## 2022-09-15 ENCOUNTER — Other Ambulatory Visit: Payer: Self-pay | Admitting: Family Medicine

## 2022-10-11 DIAGNOSIS — L84 Corns and callosities: Secondary | ICD-10-CM | POA: Diagnosis not present

## 2022-10-11 DIAGNOSIS — L821 Other seborrheic keratosis: Secondary | ICD-10-CM | POA: Diagnosis not present

## 2022-10-11 DIAGNOSIS — L570 Actinic keratosis: Secondary | ICD-10-CM | POA: Diagnosis not present

## 2022-10-11 DIAGNOSIS — G5762 Lesion of plantar nerve, left lower limb: Secondary | ICD-10-CM | POA: Diagnosis not present

## 2022-10-11 DIAGNOSIS — D485 Neoplasm of uncertain behavior of skin: Secondary | ICD-10-CM | POA: Diagnosis not present

## 2022-10-23 DIAGNOSIS — H04123 Dry eye syndrome of bilateral lacrimal glands: Secondary | ICD-10-CM | POA: Diagnosis not present

## 2022-10-23 DIAGNOSIS — H524 Presbyopia: Secondary | ICD-10-CM | POA: Diagnosis not present

## 2022-10-23 DIAGNOSIS — H353132 Nonexudative age-related macular degeneration, bilateral, intermediate dry stage: Secondary | ICD-10-CM | POA: Diagnosis not present

## 2022-10-23 DIAGNOSIS — H52203 Unspecified astigmatism, bilateral: Secondary | ICD-10-CM | POA: Diagnosis not present

## 2022-10-23 DIAGNOSIS — H3500 Unspecified background retinopathy: Secondary | ICD-10-CM | POA: Diagnosis not present

## 2022-10-23 DIAGNOSIS — H401132 Primary open-angle glaucoma, bilateral, moderate stage: Secondary | ICD-10-CM | POA: Diagnosis not present

## 2022-10-24 ENCOUNTER — Telehealth: Payer: Self-pay | Admitting: Hematology and Oncology

## 2022-10-24 NOTE — Telephone Encounter (Signed)
Patient stated they would not be available the day of their appointment, patient is aware of rescheduled time/dates of new appointment

## 2022-10-25 ENCOUNTER — Telehealth: Payer: Self-pay | Admitting: Family Medicine

## 2022-10-25 NOTE — Telephone Encounter (Addendum)
Pt's eye doc, Dr. Burgess Estelle asked Pt to contact PCP to request a carotid ultra sound   Pt also states eye doc was supposed to contact PCP and Pt is wondering how that conversation went?    Pt would like a call back to discuss.

## 2022-10-26 NOTE — Telephone Encounter (Signed)
Wants to know if pcp know of someone that could do a carotid ultrasound on the patient.

## 2022-10-30 NOTE — Telephone Encounter (Signed)
I can order the test, but why does the eye doctor want it done?

## 2022-11-01 NOTE — Telephone Encounter (Signed)
Message sent to pt via My Chart

## 2022-11-06 ENCOUNTER — Inpatient Hospital Stay: Payer: Medicare Other | Admitting: Hematology and Oncology

## 2022-11-07 DIAGNOSIS — H401122 Primary open-angle glaucoma, left eye, moderate stage: Secondary | ICD-10-CM | POA: Diagnosis not present

## 2022-11-13 ENCOUNTER — Other Ambulatory Visit: Payer: Self-pay | Admitting: Family Medicine

## 2022-11-29 ENCOUNTER — Other Ambulatory Visit: Payer: Self-pay

## 2022-11-29 ENCOUNTER — Inpatient Hospital Stay: Payer: Medicare Other | Attending: Hematology and Oncology | Admitting: Hematology and Oncology

## 2022-11-29 VITALS — BP 126/88 | HR 78 | Temp 97.2°F | Resp 18 | Ht 65.0 in | Wt 172.5 lb

## 2022-11-29 DIAGNOSIS — Z923 Personal history of irradiation: Secondary | ICD-10-CM | POA: Insufficient documentation

## 2022-11-29 DIAGNOSIS — C50411 Malignant neoplasm of upper-outer quadrant of right female breast: Secondary | ICD-10-CM

## 2022-11-29 DIAGNOSIS — Z853 Personal history of malignant neoplasm of breast: Secondary | ICD-10-CM | POA: Insufficient documentation

## 2022-11-29 DIAGNOSIS — Z9221 Personal history of antineoplastic chemotherapy: Secondary | ICD-10-CM | POA: Diagnosis not present

## 2022-11-29 DIAGNOSIS — Z171 Estrogen receptor negative status [ER-]: Secondary | ICD-10-CM

## 2022-11-29 NOTE — Progress Notes (Signed)
Patient Care Team: Nelwyn Salisbury, MD as PCP - General (Family Medicine) Glenna Fellows, MD (Inactive) as Consulting Physician (General Surgery) Serena Croissant, MD as Consulting Physician (Hematology and Oncology) Chipper Herb, MD (Inactive) as Consulting Physician (Radiation Oncology) Hubbard Hartshorn, NP (Inactive) as Nurse Practitioner (Nurse Practitioner) Salomon Fick, NP as Nurse Practitioner (Hematology and Oncology)  DIAGNOSIS:  Encounter Diagnosis  Name Primary?   Malignant neoplasm of upper-outer quadrant of right breast in female, estrogen receptor negative (HCC) Yes    SUMMARY OF ONCOLOGIC HISTORY: Oncology History  Breast cancer of upper-outer quadrant of right female breast (HCC)  03/18/2014 Mammogram   Right breast: suspicious mass 10:00 position measuring 4.9 x 3 x 5 cm, 2 lower right axillary lymph nodes mildly thickened cortices   03/23/2014 Initial Biopsy   Right breast needle biopsy 9:00: Invasive ductal carcinoma grade 3, ER- (0%), PR- (0%), HER-2 negative (ratio 1.3), Ki67 95%   03/31/2014 Breast MRI   Right breast mass 9:00 upper outer quadrant with contiguous skin involvement by 5.7 cm, right axillary lymph nodes normal in size   04/01/2014 Clinical Stage   Stage IIB: T2 N0   04/10/2014 - 08/26/2014 Neo-Adjuvant Chemotherapy   Dose dense doxorubicin and cyclophosphamide 4 cycles followed by weekly paclitaxel 12. After week 1, paclitaxel changed to nab-paclitaxel    08/28/2014 Breast MRI   Decrease in the size of the right breast mass from 5.7 cm to 8 mm, skin enhancement is no longer seen, decreased in size of axillary lymph nodes, excellent response to chemotherapy   09/29/2014 Definitive Surgery   Right Lumpectomy / SLNB ([facility]): pathologic CR, 2 LN removed and negative for malignancy (0/2 LN)   11/11/2014 - 12/18/2014 Radiation Therapy   Adjuvant RT [place] [facility]): Right breast 50.4 Gy over 28 fractions   02/24/2015 Survivorship    Survivorship care plan completed and mailed to patient in lieu of in person visit.     CHIEF COMPLIANT: Follow-up of triple breast cancer surveillance    INTERVAL HISTORY: Stephanie Olsen is a 77 y.o. with above-mentioned history of right breast cancer treated with neoadjuvant She presents to the clinic today for a follow-up. Patient reports that she is well. She states that she does have dry eyes.   ALLERGIES:  is allergic to amoxicillin-pot clavulanate and fosamax [alendronate sodium].  MEDICATIONS:  Current Outpatient Medications  Medication Sig Dispense Refill   Ascorbic Acid (VITAMIN C) 500 MG tablet Take 500 mg by mouth 2 (two) times daily.     azithromycin (ZITHROMAX Z-PAK) 250 MG tablet 2 tablets initially, then 1 daily until medication completed. 6 each 0   Calcium Carb-Cholecalciferol 600-800 MG-UNIT TABS Take 1 tablet by mouth 2 (two) times daily.      cetirizine (ZYRTEC) 10 MG tablet Take 10 mg by mouth daily as needed (seasonal allergies).     clonazePAM (KLONOPIN) 2 MG tablet TAKE 1 TABLET(2 MG) BY MOUTH TWICE DAILY AS NEEDED FOR ANXIETY 180 tablet 1   COVID-19 mRNA vaccine 2023-2024 (COMIRNATY) syringe Inject into the muscle. 0.3 mL 0   cyclobenzaprine (FLEXERIL) 10 MG tablet TAKE 1 TABLET BY MOUTH THREE TIMES DAILY AS NEEDED FOR MUSCLE SPASMS 90 tablet 5   dorzolamide-timolol (COSOPT) 22.3-6.8 MG/ML ophthalmic solution Place 1 drop into both eyes 2 (two) times daily. 10 mL    fish oil-omega-3 fatty acids 1000 MG capsule Take 1 g by mouth at bedtime.     fluticasone (FLONASE) 50 MCG/ACT nasal spray SHAKE LIQUID AND USE  2 SPRAYS IN EACH NOSTRIL DAILY 48 g 3   ketorolac (ACULAR) 0.4 % SOLN Place 1 drop into the left eye 4 (four) times daily.     latanoprost (XALATAN) 0.005 % ophthalmic solution Place 1 drop into both eyes at bedtime.     Multiple Vitamins-Minerals (PRESERVISION AREDS 2 PO) Take 1 capsule by mouth 2 (two) times daily.      ondansetron (ZOFRAN) 4 MG tablet  Take 1 tablet (4 mg total) by mouth every 8 (eight) hours as needed for nausea or vomiting. 20 tablet 0   RSV vaccine recomb adjuvanted (AREXVY) 120 MCG/0.5ML injection Inject into the muscle. 0.5 mL 0   solifenacin (VESICARE) 10 MG tablet TAKE 1 TABLET(10 MG) BY MOUTH DAILY 90 tablet 0   amoxicillin-clavulanate (AUGMENTIN) 875-125 MG tablet Take 1 tablet by mouth 2 (two) times daily. 20 tablet 0   beta carotene w/minerals (OCUVITE) tablet      Calcium Carb-Cholecalciferol (CALCIUM 500 + D) 500-5 MG-MCG TABS      No current facility-administered medications for this visit.    PHYSICAL EXAMINATION: ECOG PERFORMANCE STATUS: 1 - Symptomatic but completely ambulatory  Vitals:   11/29/22 1516  BP: 126/88  Pulse: 78  Resp: 18  Temp: (!) 97.2 F (36.2 C)  SpO2: 98%   Filed Weights   11/29/22 1516  Weight: 172 lb 8 oz (78.2 kg)    BREAST: No palpable masses or nodules in either right or left breasts. No palpable axillary supraclavicular or infraclavicular adenopathy no breast tenderness or nipple discharge. (exam performed in the presence of a chaperone)  LABORATORY DATA:  I have reviewed the data as listed    Latest Ref Rng & Units 11/03/2021    3:07 PM 05/28/2020   11:19 AM 05/28/2019   10:00 AM  CMP  Glucose 70 - 99 mg/dL 86  81  95   BUN 8 - 23 mg/dL 10  10  12    Creatinine 0.44 - 1.00 mg/dL 4.09  8.11  9.14   Sodium 135 - 145 mmol/L 130  135  137   Potassium 3.5 - 5.1 mmol/L 3.6  4.6  4.7   Chloride 98 - 111 mmol/L 99  101  104   CO2 22 - 32 mmol/L 25  27  26    Calcium 8.9 - 10.3 mg/dL 9.4  78.2  9.5   Total Protein 6.5 - 8.1 g/dL 6.9  7.7  6.8   Total Bilirubin 0.3 - 1.2 mg/dL 0.8  0.8  0.9   Alkaline Phos 38 - 126 U/L 57  71  70   AST 15 - 41 U/L 18  25  26    ALT 0 - 44 U/L 11  19  29      Lab Results  Component Value Date   WBC 7.4 11/03/2021   HGB 12.9 11/03/2021   HCT 37.1 11/03/2021   MCV 86.9 11/03/2021   PLT 207 11/03/2021   NEUTROABS 4.3 11/03/2021     ASSESSMENT & PLAN:  Breast cancer of upper-outer quadrant of right female breast Right breast invasive ductal carcinoma, palpable mass, 5 cm by mammogram and 5.7 cm by MRI grade 3, ER PR HER-2 negative, associated skin dimpling, T3, N0, M0 stage IIB Ki-67 90%   Chemotherapy summary: Neoadjuvant chemotherapy with dose dense Adriamycin and Cytoxan 04/10/2014. followed by weekly Taxol x1 changed to Abraxane 11 from 06/17/2014 completed 08/26/2014 Rt Lumpectomy: 09/29/14: Path CR 0/2 LN Completed adjuvant radiation therapy 12/18/2014   Breast Cancer Surveillance:  1. Breast exam 11/29/2022: right breast upper quadrant feels tight in the muscle. No palpable nodularity. 2. Mammograms 07/11/2022: Benign , breast density category C 3. Bone density 12/09/2019: T score -1.3 osteopenia (unchanged from before)   Fatigue: Patient exercises regularly but she feels very tired most often. Echocardiogram 11/14/2018: EF 55 to 60%   She continues to stay active and helps take care of her 62 yr old granddaughter.   RTC in 1 yr for continued surveillance   Orders Placed This Encounter  Procedures   MM DIAG BREAST TOMO BILATERAL    Standing Status:   Future    Standing Expiration Date:   11/29/2023    Order Specific Question:   Reason for Exam (SYMPTOM  OR DIAGNOSIS REQUIRED)    Answer:   surveillance    Order Specific Question:   Preferred imaging location?    Answer:   [facility]    Order Specific Question:   Release to patient    Answer:   Immediate   The patient has a good understanding of the overall plan. she agrees with it. she will call with any problems that may develop before the next visit here. Total time spent: 30 mins including face to face time and time spent for planning, charting and co-ordination of care   Stephanie Meek, MD 11/29/22    I Janan Ridge am acting as a Neurosurgeon for [employer]  I have reviewed the above documentation for accuracy and completeness, and  I agree with the above.

## 2022-11-29 NOTE — Assessment & Plan Note (Signed)
Right breast invasive ductal carcinoma, palpable mass, 5 cm by mammogram and 5.7 cm by MRI grade 3, ER PR HER-2 negative, associated skin dimpling, T3, N0, M0 stage IIB Ki-67 90%   Chemotherapy summary: Neoadjuvant chemotherapy with dose dense Adriamycin and Cytoxan 04/10/2014. followed by weekly Taxol x1 changed to Abraxane 11 from 06/17/2014 completed 08/26/2014 Rt Lumpectomy: 09/29/14: Path CR 0/2 LN Completed adjuvant radiation therapy 12/18/2014   Breast Cancer Surveillance: 1. Breast exam 11/29/2022: right breast upper quadrant feels tight in the muscle. No palpable nodularity. 2. Mammograms 07/11/2022: Benign , breast density category C 3. Bone density 12/09/2019: T score -1.3 osteopenia (unchanged from before)   Fatigue: Patient exercises regularly but she feels very tired most often. Echocardiogram 11/14/2018: EF 55 to 60%   She continues to stay active and helps take care of her 75 yr old granddaughter.   RTC in 1 yr for continued surveillance

## 2022-12-12 ENCOUNTER — Other Ambulatory Visit: Payer: Self-pay | Admitting: Family Medicine

## 2022-12-22 ENCOUNTER — Telehealth: Payer: Self-pay | Admitting: Family Medicine

## 2022-12-22 ENCOUNTER — Other Ambulatory Visit: Payer: Self-pay | Admitting: Family Medicine

## 2022-12-22 NOTE — Telephone Encounter (Signed)
Pt LOV was on 04/21/22 Last refill was done on 04/28/22 Please advise

## 2022-12-22 NOTE — Telephone Encounter (Signed)
Prescription Request  12/22/2022  LOV: 04/21/2022  says she doesn't take them often but requests a refill to have when needed  What is the name of the medication or equipment? clonazePAM (KLONOPIN) 2 MG tablet  Have you contacted your pharmacy to request a refill? Yes   Which pharmacy would you like this sent to?  [id number] [place], [place] - [address] AT [facility] [address] & [facility] [address]  [place] [address] Phone: [phone] Fax: [phone]    Patient notified that their request is being sent to the clinical staff for review and that they should receive a response within 2 business days.   Please advise at Mobile [phone] (mobile)

## 2022-12-25 ENCOUNTER — Telehealth: Payer: Self-pay | Admitting: Family Medicine

## 2022-12-25 MED ORDER — CLONAZEPAM 2 MG PO TABS: ORAL_TABLET | ORAL | 1 refills | Status: AC

## 2022-12-25 NOTE — Telephone Encounter (Signed)
This is for her Clonazepam

## 2022-12-25 NOTE — Telephone Encounter (Signed)
Patient states she needs a 90-day refill sent to her pharmacy for Clonazepam.  Her last visit was 03/2022.  She states she doesn't use this medication all the time, only when needed, so she didn't realize she completely was out.    Pharmacy- [facility] on [address]

## 2022-12-25 NOTE — Telephone Encounter (Signed)
Done

## 2022-12-29 DIAGNOSIS — H401122 Primary open-angle glaucoma, left eye, moderate stage: Secondary | ICD-10-CM | POA: Diagnosis not present

## 2022-12-29 DIAGNOSIS — H04123 Dry eye syndrome of bilateral lacrimal glands: Secondary | ICD-10-CM | POA: Diagnosis not present

## 2023-02-08 ENCOUNTER — Other Ambulatory Visit (HOSPITAL_BASED_OUTPATIENT_CLINIC_OR_DEPARTMENT_OTHER): Payer: Self-pay

## 2023-02-08 DIAGNOSIS — Z23 Encounter for immunization: Secondary | ICD-10-CM | POA: Diagnosis not present

## 2023-02-08 MED ORDER — COMIRNATY 30 MCG/0.3ML IM SUSY
PREFILLED_SYRINGE | INTRAMUSCULAR | 0 refills | Status: DC
  Filled 2023-02-08: qty 0.3, 1d supply, fill #0

## 2023-02-28 ENCOUNTER — Other Ambulatory Visit (HOSPITAL_BASED_OUTPATIENT_CLINIC_OR_DEPARTMENT_OTHER): Payer: Self-pay

## 2023-02-28 DIAGNOSIS — Z23 Encounter for immunization: Secondary | ICD-10-CM | POA: Diagnosis not present

## 2023-02-28 MED ORDER — INFLUENZA VAC A&B SURF ANT ADJ 0.5 ML IM SUSY
0.5000 mL | PREFILLED_SYRINGE | Freq: Once | INTRAMUSCULAR | 0 refills | Status: AC
  Filled 2023-02-28: qty 0.5, 1d supply, fill #0

## 2023-03-01 DIAGNOSIS — M25551 Pain in right hip: Secondary | ICD-10-CM | POA: Diagnosis not present

## 2023-03-10 ENCOUNTER — Other Ambulatory Visit: Payer: Self-pay | Admitting: Family Medicine

## 2023-04-02 DIAGNOSIS — M7061 Trochanteric bursitis, right hip: Secondary | ICD-10-CM | POA: Diagnosis not present

## 2023-04-02 DIAGNOSIS — M7062 Trochanteric bursitis, left hip: Secondary | ICD-10-CM | POA: Diagnosis not present

## 2023-04-04 DIAGNOSIS — H04123 Dry eye syndrome of bilateral lacrimal glands: Secondary | ICD-10-CM | POA: Diagnosis not present

## 2023-04-04 DIAGNOSIS — H401132 Primary open-angle glaucoma, bilateral, moderate stage: Secondary | ICD-10-CM | POA: Diagnosis not present

## 2023-04-04 DIAGNOSIS — H353132 Nonexudative age-related macular degeneration, bilateral, intermediate dry stage: Secondary | ICD-10-CM | POA: Diagnosis not present

## 2023-04-09 DIAGNOSIS — M25551 Pain in right hip: Secondary | ICD-10-CM | POA: Diagnosis not present

## 2023-04-17 DIAGNOSIS — M25551 Pain in right hip: Secondary | ICD-10-CM | POA: Diagnosis not present

## 2023-04-20 DIAGNOSIS — M25551 Pain in right hip: Secondary | ICD-10-CM | POA: Diagnosis not present

## 2023-04-28 DIAGNOSIS — M79662 Pain in left lower leg: Secondary | ICD-10-CM | POA: Diagnosis not present

## 2023-04-28 DIAGNOSIS — M79652 Pain in left thigh: Secondary | ICD-10-CM | POA: Diagnosis not present

## 2023-04-30 DIAGNOSIS — M25551 Pain in right hip: Secondary | ICD-10-CM | POA: Diagnosis not present

## 2023-05-04 DIAGNOSIS — M25551 Pain in right hip: Secondary | ICD-10-CM | POA: Diagnosis not present

## 2023-05-07 DIAGNOSIS — M25551 Pain in right hip: Secondary | ICD-10-CM | POA: Diagnosis not present

## 2023-05-10 DIAGNOSIS — M25551 Pain in right hip: Secondary | ICD-10-CM | POA: Diagnosis not present

## 2023-05-16 DIAGNOSIS — M25551 Pain in right hip: Secondary | ICD-10-CM | POA: Diagnosis not present

## 2023-05-21 DIAGNOSIS — M25551 Pain in right hip: Secondary | ICD-10-CM | POA: Diagnosis not present

## 2023-05-23 DIAGNOSIS — H353221 Exudative age-related macular degeneration, left eye, with active choroidal neovascularization: Secondary | ICD-10-CM | POA: Diagnosis not present

## 2023-05-23 DIAGNOSIS — H35363 Drusen (degenerative) of macula, bilateral: Secondary | ICD-10-CM | POA: Diagnosis not present

## 2023-05-23 DIAGNOSIS — H401132 Primary open-angle glaucoma, bilateral, moderate stage: Secondary | ICD-10-CM | POA: Diagnosis not present

## 2023-05-23 DIAGNOSIS — H43392 Other vitreous opacities, left eye: Secondary | ICD-10-CM | POA: Diagnosis not present

## 2023-05-23 DIAGNOSIS — H353112 Nonexudative age-related macular degeneration, right eye, intermediate dry stage: Secondary | ICD-10-CM | POA: Diagnosis not present

## 2023-05-24 DIAGNOSIS — M25551 Pain in right hip: Secondary | ICD-10-CM | POA: Diagnosis not present

## 2023-05-28 DIAGNOSIS — M25551 Pain in right hip: Secondary | ICD-10-CM | POA: Diagnosis not present

## 2023-05-31 DIAGNOSIS — M25551 Pain in right hip: Secondary | ICD-10-CM | POA: Diagnosis not present

## 2023-06-06 DIAGNOSIS — M25551 Pain in right hip: Secondary | ICD-10-CM | POA: Diagnosis not present

## 2023-06-07 ENCOUNTER — Other Ambulatory Visit: Payer: Self-pay | Admitting: Family Medicine

## 2023-06-08 DIAGNOSIS — M25551 Pain in right hip: Secondary | ICD-10-CM | POA: Diagnosis not present

## 2023-06-12 DIAGNOSIS — M25551 Pain in right hip: Secondary | ICD-10-CM | POA: Diagnosis not present

## 2023-06-15 DIAGNOSIS — M25551 Pain in right hip: Secondary | ICD-10-CM | POA: Diagnosis not present

## 2023-06-19 DIAGNOSIS — M25551 Pain in right hip: Secondary | ICD-10-CM | POA: Diagnosis not present

## 2023-06-20 DIAGNOSIS — H353221 Exudative age-related macular degeneration, left eye, with active choroidal neovascularization: Secondary | ICD-10-CM | POA: Diagnosis not present

## 2023-06-27 DIAGNOSIS — M25551 Pain in right hip: Secondary | ICD-10-CM | POA: Diagnosis not present

## 2023-06-29 DIAGNOSIS — M25551 Pain in right hip: Secondary | ICD-10-CM | POA: Diagnosis not present

## 2023-07-12 ENCOUNTER — Ambulatory Visit
Admission: RE | Admit: 2023-07-12 | Discharge: 2023-07-12 | Disposition: A | Discharge status: Home / Self Care | Payer: Medicare Other | Source: Ambulatory Visit | Attending: Hematology and Oncology | Admitting: Hematology and Oncology

## 2023-07-12 DIAGNOSIS — C50411 Malignant neoplasm of upper-outer quadrant of right female breast: Secondary | ICD-10-CM

## 2023-07-12 DIAGNOSIS — Z853 Personal history of malignant neoplasm of breast: Secondary | ICD-10-CM | POA: Diagnosis not present

## 2023-07-12 DIAGNOSIS — Z9011 Acquired absence of right breast and nipple: Secondary | ICD-10-CM | POA: Diagnosis not present

## 2023-07-18 DIAGNOSIS — H353221 Exudative age-related macular degeneration, left eye, with active choroidal neovascularization: Secondary | ICD-10-CM | POA: Diagnosis not present

## 2023-07-30 ENCOUNTER — Telehealth: Payer: Self-pay

## 2023-07-30 ENCOUNTER — Other Ambulatory Visit: Payer: Self-pay

## 2023-07-30 DIAGNOSIS — C50411 Malignant neoplasm of upper-outer quadrant of right female breast: Secondary | ICD-10-CM

## 2023-07-30 NOTE — Telephone Encounter (Signed)
 Pt called and states her daughter went to see OBGYN today and was advised that pt needs genetic testing since she has hx of TNBC. In 2015 she saw genetics and it was advised she would not need genetics, but now this has changed. Referral entered to genetics per MD pt is aware she should receive a call to be scheduled.

## 2023-08-09 ENCOUNTER — Encounter: Payer: Self-pay | Admitting: Family Medicine

## 2023-08-09 ENCOUNTER — Inpatient Hospital Stay: Attending: Genetic Counselor | Admitting: Genetic Counselor

## 2023-08-09 ENCOUNTER — Inpatient Hospital Stay

## 2023-08-09 ENCOUNTER — Other Ambulatory Visit: Payer: Self-pay | Admitting: Genetic Counselor

## 2023-08-09 ENCOUNTER — Ambulatory Visit: Payer: Medicare Other | Admitting: Family Medicine

## 2023-08-09 DIAGNOSIS — Z171 Estrogen receptor negative status [ER-]: Secondary | ICD-10-CM

## 2023-08-09 DIAGNOSIS — C50411 Malignant neoplasm of upper-outer quadrant of right female breast: Secondary | ICD-10-CM

## 2023-08-09 DIAGNOSIS — Z0000 Encounter for general adult medical examination without abnormal findings: Secondary | ICD-10-CM

## 2023-08-09 LAB — GENETIC SCREENING ORDER

## 2023-08-09 NOTE — Patient Instructions (Addendum)
 I really enjoyed getting to talk with you today! I am available on Tuesdays and Thursdays for virtual visits if you have any questions or concerns, or if I can be of any further assistance.   CHECKLIST FROM ANNUAL WELLNESS VISIT:  -Follow up (please call to schedule if not scheduled after visit):   -yearly for annual wellness visit with primary care office  Here is a list of your preventive care/health maintenance measures and the plan for each if any are due:  PLAN For any measures below that may be due:  -can get the vaccines at the pharmacy  Health Maintenance  Topic Date Due   Zoster Vaccines- Shingrix (1 of 2) 05/08/1964   Pneumonia Vaccine 63+ Years old (2 of 2 - PPSV23) 07/29/2020   INFLUENZA VACCINE  11/30/2023   Medicare Annual Wellness (AWV)  08/08/2024   DTaP/Tdap/Td (3 - Td or Tdap) 10/31/2025   DEXA SCAN  Completed   Hepatitis C Screening  Completed   HPV VACCINES  Aged Out   Meningococcal B Vaccine  Aged Out   Colonoscopy  Discontinued   COVID-19 Vaccine  Discontinued    -See a dentist at least yearly  -Get your eyes checked and then per your eye specialist's recommendations  -Other issues addressed today:   -I have included below further information regarding a healthy whole foods based diet, physical activity guidelines for adults, stress management and opportunities for social connections. I hope you find this information useful.   -----------------------------------------------------------------------------------------------------------------------------------------------------------------------------------------------------------------------------------------------------------    NUTRITION: -eat real food: lots of colorful vegetables (half the plate) and fruits -5-7 servings of vegetables and fruits per day (fresh or steamed is best), exp. 2 servings of vegetables with lunch and dinner and 2 servings of fruit per day. Berries and greens such as kale and  collards are great choices.  -consume on a regular basis:  fresh fruits, fresh veggies, fish, nuts, seeds, healthy oils (such as olive oil, avocado oil), whole grains (make sure for bread/pasta/crackers/etc., that the first ingredient on label contains the word "whole"), legumes. -can eat small amounts of dairy and lean meat (no larger than the palm of your hand), but avoid processed meats such as ham, bacon, lunch meat, etc. -drink water -try to avoid fast food and pre-packaged foods, processed meat, ultra processed foods/beverages (donuts, candy, etc.) -most experts advise limiting sodium to < 2300mg  per day, should limit further is any chronic conditions such as high blood pressure, heart disease, diabetes, etc. The American Heart Association advised that < 1500mg  is is ideal -try to avoid foods/beverages that contain any ingredients with names you do not recognize  -try to avoid foods/beverages  with added sugar or sweeteners/sweets  -try to avoid sweet drinks (including diet drinks): soda, juice, Gatorade, sweet tea, power drinks, diet drinks -try to avoid white rice, white bread, pasta (unless whole grain)  EXERCISE GUIDELINES FOR ADULTS: -if you wish to increase your physical activity, do so gradually and with the approval of your doctor -STOP and seek medical care immediately if you have any chest pain, chest discomfort or trouble breathing when starting or increasing exercise  -move and stretch your body, legs, feet and arms when sitting for long periods -Physical activity guidelines for optimal health in adults: -get at least 150 minutes per week of moderate exercise (can talk, but not sing); this is about 20-30 minutes of sustained activity 5-7 days per week or two 10-15 minute episodes of sustained activity 5-7 days per week -do some muscle  building/resistance training/strength training at least 2 days per week  -balance exercises 3+ days per week:   Stand somewhere where you have  something sturdy to hold onto if you lose balance    1) lift up on toes, then back down, start with 5x per day and work up to 20x   2) stand and lift one leg straight out to the side so that foot is a few inches of the floor, start with 5x each side and work up to 20x each side   3) stand on one foot, start with 5 seconds each side and work up to 20 seconds on each side  If you need ideas or help with getting more active:  -Silver sneakers https://tools.silversneakers.com  -Walk with a Doc: http://www.duncan-williams.com/  -try to include resistance (weight lifting/strength building) and balance exercises twice per week: or the following link for ideas: http://castillo-powell.com/  BuyDucts.dk  STRESS MANAGEMENT: -can try meditating, or just sitting quietly with deep breathing while intentionally relaxing all parts of your body for 5 minutes daily -if you need further help with stress, anxiety or depression please follow up with your primary doctor or contact the wonderful folks at [facility]: [phone]  SOCIAL CONNECTIONS: -options in [place] if you wish to engage in more social and exercise related activities:  -Silver sneakers https://tools.silversneakers.com  -Walk with a Doc: http://www.duncan-williams.com/  -Check out the [facility] Active Adults 50+ section on the [place] of [employer] (hiking clubs, book clubs, cards and games, chess, exercise classes, aquatic classes and much more) - see the website for details: https://www.[place].gov/departments/parks-recreation/active-adults50  -YouTube has lots of exercise videos for different ages and abilities as well  -[facility] (a variety of indoor and outdoor inperson activities for adults). [phone]. [address].  -Virtual Online Classes (a variety of topics): see seniorplanet.org or call  [phone]  -consider volunteering at a school, hospice center, church, senior center or elsewhere    ADVANCED HEALTHCARE DIRECTIVES:  [place] Advanced Directives assistance:   ExpressWeek.com.cy  Everyone should have advanced health care directives in place. This is so that you get the care you want, should you ever be in a situation where you are unable to make your own medical decisions.   From the [place] Advanced Directive Website: "Advance Health Care Directives are legal documents in which you give written instructions about your health care if, in the future, you cannot speak for yourself.   A health care power of attorney allows you to name a person you trust to make your health care decisions if you cannot make them yourself. A declaration of a desire for a natural death (or living will) is document, which states that you desire not to have your life prolonged by extraordinary measures if you have a terminal or incurable illness or if you are in a vegetative state. An advance instruction for mental health treatment makes a declaration of instructions, information and preferences regarding your mental health treatment. It also states that you are aware that the advance instruction authorizes a mental health treatment provider to act according to your wishes. It may also outline your consent or refusal of mental health treatment. A declaration of an anatomical gift allows anyone over the age of 49 to make a gift by will, organ donor card or other document."   Please see the following website or an elder law attorney for forms, FAQs and for completion of advanced directives: [place] Surveyor, minerals of [employer] Advance  Health Care Directives (http://guzman.com/)  Or copy and paste the following to your web browser: PoshChat.fi

## 2023-08-09 NOTE — Progress Notes (Signed)
 REFERRING PROVIDER: Serena Croissant, MD [address] [place],  [place] [address]  PRIMARY PROVIDER:  Nelwyn Salisbury, MD  PRIMARY REASON FOR VISIT:  1. Malignant neoplasm of upper-outer quadrant of right breast in female, estrogen receptor negative (HCC)      HISTORY OF PRESENT ILLNESS:   [name], a 78 y.o. female, was seen for a [place] cancer genetics consultation at the request of Dr. Pamelia Hoit due to a personal and family history of cancer.  [name] presents to clinic today to discuss the possibility of a hereditary predisposition to cancer, genetic testing, and to further clarify her future cancer risks, as well as potential cancer risks for family members.   In 2015, at the age of 24, [name] was diagnosed with triple negative cancer of the right breast. The treatment plan chemotherapy, radiation and lumpectomy. [name] was seen in 2016 for genetic testing but did not meeting testing criteria at that time.  She is returning now that testing criteria has expanded and allows for women over 54 with TN breast cancer to have genetic testing.   CANCER HISTORY:  Oncology History  Breast cancer of upper-outer quadrant of right female breast (HCC)  03/18/2014 Mammogram   Right breast: suspicious mass 10:00 position measuring 4.9 x 3 x 5 cm, 2 lower right axillary lymph nodes mildly thickened cortices   03/23/2014 Initial Biopsy   Right breast needle biopsy 9:00: Invasive ductal carcinoma grade 3, ER- (0%), PR- (0%), HER-2 negative (ratio 1.3), Ki67 95%   03/31/2014 Breast MRI   Right breast mass 9:00 upper outer quadrant with contiguous skin involvement by 5.7 cm, right axillary lymph nodes normal in size   04/01/2014 Clinical Stage   Stage IIB: T2 N0   04/10/2014 - 08/26/2014 Neo-Adjuvant Chemotherapy   Dose dense doxorubicin and cyclophosphamide 4 cycles followed by weekly paclitaxel 12. After week 1, paclitaxel changed to nab-paclitaxel     08/28/2014 Breast MRI   Decrease in the size of the right breast mass from 5.7 cm to 8 mm, skin enhancement is no longer seen, decreased in size of axillary lymph nodes, excellent response to chemotherapy   09/29/2014 Definitive Surgery   Right Lumpectomy / SLNB ([facility]): pathologic CR, 2 LN removed and negative for malignancy (0/2 LN)   11/11/2014 - 12/18/2014 Radiation Therapy   Adjuvant RT [place] [facility]rape): Right breast 50.4 Gy over 28 fractions   02/24/2015 Survivorship   Survivorship care plan completed and mailed to patient in lieu of in person visit.      RISK FACTORS:  Menarche was at age 71.  First live birth at age 89.  OCP use for approximately 3 years.  Ovaries intact: yes.  Hysterectomy: no.  Menopausal status: postmenopausal.  HRT use: 0 and 1 years. Colonoscopy: yes; abnormal. Mammogram within the last year: yes. Number of breast biopsies: 0. Up to date with pelvic exams:  yes. Any excessive radiation exposure in the past:  no  Past Medical History:  Diagnosis Date   Allergy    Anxiety    Breast cancer (HCC) 2016   Right Breast   Breast cancer of upper-outer quadrant of right female breast (HCC) 03/25/2014   CHF (congestive heart failure) (HCC)    Chicken pox    Dilated cardiomyopathy (HCC)    sees Dr. Gala Romney    Glaucoma    sees Dr. Adele Dan    [id number](478.2)    Hypertension    Neuromuscular disorder (HCC)    peripheral neuropathy from  chemo   Osteopenia    Osteoporosis    pt states has ostopenia not osteoporosis    Personal history of chemotherapy    2015-2016   Personal history of radiation therapy    2016   Rosacea    S/P radiation therapy 11/11/2014 through 12/18/2014                                                      Right breast 5040 cGy in 28 sessions                          Past Surgical History:  Procedure Laterality Date   APPENDECTOMY     BREAST LUMPECTOMY Right 09/28/2014   Malignant   BREAST LUMPECTOMY WITH RADIOACTIVE  SEED AND SENTINEL LYMPH NODE BIOPSY Right 09/29/2014   Procedure: RIGHT BREAST LUMPECTOMY WITH RADIOACTIVE SEED AND RIGHT AXILLARY SENTINEL LYMPH NODE BIOPSY;  Surgeon: Glenna Fellows, MD;  Location: [facility];  Service: General;  Laterality: Right;   cataract surgery on both eyes     12 years ago   COLONOSCOPY  06/2016   per Dr. Kinnie Scales, adenomatous polyps,  repeat in 5 yrs   EYE SURGERY     laser per right eye related to pressure relief; past cataract surgery bilat    laser of left eye     1 week ago   PORT-A-CATH REMOVAL Left 09/29/2014   Procedure: REMOVAL PORT-A-CATH;  Surgeon: Glenna Fellows, MD;  Location: [facility];  Service: General;  Laterality: Left;   PORTACATH PLACEMENT N/A 04/07/2014   Procedure: INSERTION PORT-A-CATH;  Surgeon: Glenna Fellows, MD;  Location: [facility];  Service: General;  Laterality: N/A;    Social History   Socioeconomic History   Marital status: Widowed    Spouse name: Not on file   Number of children: 4   Years of education: 14   Highest education level: Associate degree: occupational, Scientist, product/process development, or vocational program  Occupational History   Occupation: retired  Tobacco Use   Smoking status: Former    Current packs/day: 0.00    Average packs/day: 1 pack/day for 10.0 years (10.0 ttl pk-yrs)    Types: Cigarettes    Start date: 05/01/1965    Quit date: 05/02/1975    Years since quitting: 48.3   Smokeless tobacco: Never  Vaping Use   Vaping status: Never Used  Substance and Sexual Activity   Alcohol use: Yes    Alcohol/week: 2.0 standard drinks of alcohol    Types: 2 Standard drinks or equivalent per week    Comment: weekends-wine   Drug use: No   Sexual activity: Yes  Other Topics Concern   Not on file  Social History Narrative   Widowed   HH-1   Retired    2 cats   Social Drivers of Health   Financial Resource Strain: Low Risk  (05/17/2022)   Overall Financial Resource Strain (CARDIA)    Difficulty  of Paying Living Expenses: Not hard at all  Food Insecurity: No Food Insecurity (05/17/2022)   Hunger Vital Sign    Worried About Running Out of Food in the Last Year: Never true    Ran Out of Food in the Last Year: Never true  Transportation Needs: No Transportation Needs (05/17/2022)   PRAPARE -  Administrator, Civil Service (Medical): No    Lack of Transportation (Non-Medical): No  Physical Activity: Sufficiently Active (05/17/2022)   Exercise Vital Sign    Days of Exercise per Week: 5 days    Minutes of Exercise per Session: 60 min  Stress: No Stress Concern Present (05/17/2022)   [employer] of Occupational Health - Occupational Stress Questionnaire    Feeling of Stress : Not at all  Social Connections: Moderately Integrated (05/17/2022)   Social Connection and Isolation Panel [NHANES]    Frequency of Communication with Friends and Family: More than three times a week    Frequency of Social Gatherings with Friends and Family: More than three times a week    Attends Religious Services: More than 4 times per year    Active Member of [employer] or Organizations: Yes    Attends Banker Meetings: More than 4 times per year    Marital Status: Widowed     FAMILY HISTORY:  We obtained a detailed, 4-generation family history.  Significant diagnoses are listed below: Family History  Problem Relation Age of Onset   Lung cancer Maternal Uncle        heavy smoker   Colon cancer Paternal Grandmother 32   Lung cancer Cousin        smoker   Leukemia Cousin 3   Arthritis Other    Coronary artery disease Other    Hypertension Other    Stroke Other    Macular degeneration Other     Ms. Lopez is an only child, but her mother had four sisters and two brothers.  One brother, who was a smoker had lung cancer, but all others died of heart disease.  Her father had two brothers and two sisters, none of whom had cancer.     Patient's maternal ancestors are of  [place] descent, and paternal ancestors are of [place] descent. There is no reported Ashkenazi Jewish ancestry. There is no known consanguinity.  GENETIC COUNSELING ASSESSMENT: Ms. Shearer is a 78 y.o. female with a personal and family history of cancer which is somewhat suggestive of a hereditary cancer syndrome and predisposition to cancer given her triple negative breast cancer. We, therefore, discussed and recommended the following at today's visit.   DISCUSSION: We discussed that, in general, most cancer is not inherited in families, but instead is sporadic or familial. Sporadic cancers occur by chance and typically happen at older ages (>50 years) as this type of cancer is caused by genetic changes acquired during an individual's lifetime. Some families have more cancers than would be expected by chance; however, the ages or types of cancer are not consistent with a known genetic mutation or known genetic mutations have been ruled out. This type of familial cancer is thought to be due to a combination of multiple genetic, environmental, hormonal, and lifestyle factors. While this combination of factors likely increases the risk of cancer, the exact source of this risk is not currently identifiable or testable.  We discussed that 5 - 10% of breast cancer is hereditary, with most cases associated with BRCA mutations.  There are other genes that can be associated with hereditary breast cancer syndromes.  These include ATM, CHEK2 and PALB2.  We discussed that testing is beneficial for several reasons including knowing how to follow individuals after completing their treatment, identifying whether potential treatment options such as PARP inhibitors would be beneficial, and understand if other family members could be at risk for cancer  and allow them to undergo genetic testing.   We reviewed the characteristics, features and inheritance patterns of hereditary cancer syndromes. We also discussed  genetic testing, including the appropriate family members to test, the process of testing, insurance coverage and turn-around-time for results. We discussed the implications of a negative, positive, carrier and/or variant of uncertain significant result. Ms. Giel  was offered a common hereditary cancer panel (36+ genes) and an expanded pan-cancer panel (70+ genes). Ms. Hazell was informed of the benefits and limitations of each panel, including that expanded pan-cancer panels contain genes that do not have clear management guidelines at this point in time.  We also discussed that as the number of genes included on a panel increases, the chances of variants of uncertain significance increases. Ms. Persico decided to pursue genetic testing for the CancerNext-Expanded+RNAinsight gene panel.   The CancerNext-Expanded gene panel offered by [facility] and includes sequencing, rearrangement, and RNA analysis for the following 76 genes: AIP, ALK, APC, ATM, AXIN2, BAP1, BARD1, BMPR1A, BRCA1, BRCA2, BRIP1, CDC73, CDH1, CDK4, CDKN1B, CDKN2A, CEBPA, CHEK2, CTNNA1, DDX41, DICER1, ETV6, FH, FLCN, GATA2, LZTR1, MAX, MBD4, MEN1, MET, MLH1, MSH2, MSH3, MSH6, MUTYH, NF1, NF2, NTHL1, PALB2, PHOX2B, PMS2, POT1, PRKAR1A, PTCH1, PTEN, RAD51C, RAD51D, RB1, RET, RUNX1, SDHA, SDHAF2, SDHB, SDHC, SDHD, SMAD4, SMARCA4, SMARCB1, SMARCE1, STK11, SUFU, TMEM127, TP53, TSC1, TSC2, VHL, and WT1 (sequencing and deletion/duplication); EGFR, HOXB13, KIT, MITF, PDGFRA, POLD1, and POLE (sequencing only); EPCAM and GREM1 (deletion/duplication only).    Based on Ms. Brumbaugh's personal and family history of cancer, she meets medical criteria for genetic testing.  Despite that she meets criteria, she may still have an out of pocket cost. We discussed that if her out of pocket cost for testing is over $100, the laboratory will call and confirm whether she wants to proceed with testing.  If the out of pocket cost of testing is less than  $100 she will be billed by the genetic testing laboratory.   PLAN: After considering the risks, benefits, and limitations, Ms. Herrle provided informed consent to pursue genetic testing and the blood sample was sent to [facility] for analysis of the CancerNext-Expanded+RNAinsight. Results should be available within approximately 2-3 weeks' time, at which point they will be disclosed by telephone to Ms. Herne, as will any additional recommendations warranted by these results. Ms. Horan will receive a summary of her genetic counseling visit and a copy of her results once available. This information will also be available in Epic.   Lastly, we encouraged Ms. Headrick to remain in contact with cancer genetics annually so that we can continuously update the family history and inform her of any changes in cancer genetics and testing that may be of benefit for this family.   Ms. Carton questions were answered to her satisfaction today. Our contact information was provided should additional questions or concerns arise. Thank you for the referral and allowing [place] to share in the care of your patient.   Ladaja Yusupov P. Lowell Guitar, MS, CGC Licensed, Patent attorney [email or web address] phone: [phone]  60 minutes were spent on the date of the encounter in service to the patient including preparation, face-to-face consultation, documentation and care coordination.  The patient was seen alone.  Drs. Meliton Rattan, and/or [place] were available for questions, if needed..    _______________________________________________________________________ For Office Staff:  Number of people involved in session: 1 Was an Intern/ student involved with case: no

## 2023-08-09 NOTE — Progress Notes (Signed)
 PATIENT CHECK-IN and HEALTH RISK ASSESSMENT QUESTIONNAIRE:  -completed by phone/video for upcoming Medicare Preventive Visit  Pre-Visit Check-in: 1)Vitals (height, wt, BP, etc) - record in vitals section for visit on day of visit Request home vitals (wt, BP, etc.) and enter into vitals, THEN update Vital Signs SmartPhrase below at the top of the HPI. See below.  2)Review and Update Medications, Allergies PMH, Surgeries, Social history in Epic 3)Hospitalizations in the last year with date/reason? no  4)Review and Update Care Team (patient's specialists) in Epic 5) Complete PHQ9 in Epic  6) Complete Fall Screening in Epic 7)Review all Health Maintenance Due and order under PCP if not done.  Medicare Wellness Patient Questionnaire:  Answer theses question about your habits: How often do you have a drink containing alcohol? 1 drink rarely How often do you have six or more drinks on one occasion?Never  Have you ever smoked?Yes  Quit date if applicable? 46 years   How many packs a day do/did you smoke? 1  Do you use smokeless tobacco? No Do you use an illicit drugs?No On average, how many days per week do you engage in moderate to strenuous exercise (like a brisk walk)? Goes to [employer], was doing PT recently as well, maybe 45 minutes 2-3 days.  Are you sexually active? No Typical breakfast: Varies  Typical lunch: Varies  Typical dinner: Varies  Typical snacks:  Ice cream   Beverages: Water, coffee   Answer theses question about your everyday activities: Can you perform most household chores? Yes  Are you deaf or have significant trouble hearing? no Do you feel that you have a problem with memory?no Do you feel safe at home?Yes  Last dentist visit?N/A 8. Do you have any difficulty performing your everyday activities?no Are you having any difficulty walking, taking medications on your own, and or difficulty managing daily home needs?no Do you have difficulty walking or climbing  stairs?no Do you have difficulty dressing or bathing?no Do you have difficulty doing errands alone such as visiting a doctor's office or shopping?no Do you currently have any difficulty preparing food and eating?no Do you currently have any difficulty using the toilet?no Do you have any difficulty managing your finances?no Do you have any difficulties with housekeeping of managing your housekeeping?no   Do you have Advanced Directives in place (Living Will, Healthcare Power or Attorney)? No   Last eye Exam and location? Has glaucoma, seeing her eye doctor for this, seeing Dr. Allena Katz, retinal specialist, has seen recently, has appt in May   Do you currently use prescribed or non-prescribed narcotic or opioid pain medications? No   Do you have a history or close family history of breast, ovarian, tubal or peritoneal cancer or a family member with BRCA (breast cancer susceptibility 1 and 2) gene mutations? Yes    Nurse/Assistant Credentials/time stamp: mg 4:13 PM    ----------------------------------------------------------------------------------------------------------------------------------------------------------------------------------------------------------------------  Because this visit was a virtual/telehealth visit, some criteria may be missing or patient reported. Any vitals not documented were not able to be obtained and vitals that have been documented are patient reported.    MEDICARE ANNUAL PREVENTIVE VISIT WITH PROVIDER: (Welcome to Medicare, initial annual wellness or annual wellness exam)  Virtual Visit via Phone Note  I connected with Stephanie Olsen on 08/09/23 by phone and verified that I am speaking with the correct person using two identifiers. Declined video as she tried and could not get it working. Prefers phone.   Location patient: home Location provider:work or home office  Persons participating in the virtual visit: patient, provider  Concerns and/or  follow up today:  reports doing ok. Seeing ortho for hip issues.    See HM section in Epic for other details of completed HM.    ROS: negative for report of fevers, unintentional weight loss, hearing loss or change, chest pain, sob, hemoptysis, melena, hematochezia, hematuria, falls, bleeding or bruising, thoughts of suicide or self harm, memory loss  Patient-completed extensive health risk assessment - reviewed and discussed with the patient: See Health Risk Assessment completed with patient prior to the visit either above or in recent phone note. This was reviewed in detailed with the patient today and appropriate recommendations, orders and referrals were placed as needed per Summary below and patient instructions.   Review of Medical History: -PMH, PSH, Family History and current specialty and care providers reviewed and updated and listed below   Patient Care Team: Nelwyn Salisbury, MD as PCP - General (Family Medicine) Glenna Fellows, MD (Inactive) as Consulting Physician (General Surgery) Serena Croissant, MD as Consulting Physician (Hematology and Oncology) Chipper Herb, MD (Inactive) as Consulting Physician (Radiation Oncology) Hubbard Hartshorn, NP (Inactive) as Nurse Practitioner (Nurse Practitioner) Salomon Fick, NP as Nurse Practitioner (Hematology and Oncology)   Past Medical History:  Diagnosis Date   Allergy    Anxiety    Breast cancer [facility]) 2016   Right Breast   Breast cancer of upper-outer quadrant of right female breast (HCC) 03/25/2014   CHF (congestive heart failure) (HCC)    Chicken pox    Dilated cardiomyopathy (HCC)    sees Dr. Gala Romney    Glaucoma    sees Dr. Adele Dan    NWGNFAOZ(308.6)    Hypertension    Neuromuscular disorder [facility])    peripheral neuropathy from chemo   Osteopenia    Osteoporosis    pt states has ostopenia not osteoporosis    Personal history of chemotherapy    2015-2016   Personal history of radiation  therapy    2016   Rosacea    S/P radiation therapy 11/11/2014 through 12/18/2014                                                      Right breast 5040 cGy in 28 sessions                          Past Surgical History:  Procedure Laterality Date   APPENDECTOMY     BREAST LUMPECTOMY Right 09/28/2014   Malignant   BREAST LUMPECTOMY WITH RADIOACTIVE SEED AND SENTINEL LYMPH NODE BIOPSY Right 09/29/2014   Procedure: RIGHT BREAST LUMPECTOMY WITH RADIOACTIVE SEED AND RIGHT AXILLARY SENTINEL LYMPH NODE BIOPSY;  Surgeon: Glenna Fellows, MD;  Location: [facility];  Service: General;  Laterality: Right;   cataract surgery on both eyes     12 years ago   COLONOSCOPY  06/2016   per Dr. Kinnie Scales, adenomatous polyps,  repeat in 5 yrs   EYE SURGERY     laser per right eye related to pressure relief; past cataract surgery bilat    laser of left eye     1 week ago   PORT-A-CATH REMOVAL Left 09/29/2014   Procedure: REMOVAL PORT-A-CATH;  Surgeon: Glenna Fellows, MD;  Location: [facility];  Service: General;  Laterality: Left;   PORTACATH PLACEMENT N/A 04/07/2014   Procedure: INSERTION PORT-A-CATH;  Surgeon: Glenna Fellows, MD;  Location: [facility];  Service: General;  Laterality: N/A;    Social History   Socioeconomic History   Marital status: Widowed    Spouse name: Not on file   Number of children: 4   Years of education: 14   Highest education level: Associate degree: occupational, Scientist, product/process development, or vocational program  Occupational History   Occupation: retired  Tobacco Use   Smoking status: Former    Current packs/day: 0.00    Average packs/day: 1 pack/day for 10.0 years (10.0 ttl pk-yrs)    Types: Cigarettes    Start date: 05/01/1965    Quit date: 05/02/1975    Years since quitting: 48.3   Smokeless tobacco: Never  Vaping Use   Vaping status: Never Used  Substance and Sexual Activity   Alcohol use: Yes    Alcohol/week: 2.0 standard drinks of alcohol     Types: 2 Standard drinks or equivalent per week    Comment: weekends-wine   Drug use: No   Sexual activity: Yes  Other Topics Concern   Not on file  Social History Narrative   Widowed   HH-1   Retired    2 cats   Social Drivers of Health   Financial Resource Strain: Low Risk  (08/09/2023)   Overall Financial Resource Strain (CARDIA)    Difficulty of Paying Living Expenses: Not hard at all  Food Insecurity: No Food Insecurity (08/09/2023)   Hunger Vital Sign    Worried About Running Out of Food in the Last Year: Never true    Ran Out of Food in the Last Year: Never true  Transportation Needs: No Transportation Needs (08/09/2023)   PRAPARE - Administrator, Civil Service (Medical): No    Lack of Transportation (Non-Medical): No  Physical Activity: Unknown (08/09/2023)   Exercise Vital Sign    Days of Exercise per Week: Patient unable to answer    Minutes of Exercise per Session: 40 min  Stress: No Stress Concern Present (08/09/2023)   [employer] of Occupational Health - Occupational Stress Questionnaire    Feeling of Stress : Only a little  Social Connections: Moderately Isolated (08/09/2023)   Social Connection and Isolation Panel [NHANES]    Frequency of Communication with Friends and Family: More than three times a week    Frequency of Social Gatherings with Friends and Family: More than three times a week    Attends Religious Services: More than 4 times per year    Active Member of [employer] or Organizations: No    Attends Banker Meetings: Never    Marital Status: Widowed  Intimate Partner Violence: Not At Risk (05/17/2022)   Humiliation, Afraid, Rape, and Kick questionnaire    Fear of Current or Ex-Partner: No    Emotionally Abused: No    Physically Abused: No    Sexually Abused: No    Family History  Problem Relation Age of Onset   Lung cancer Maternal Uncle        heavy smoker   Colon cancer Paternal Grandmother 52   Lung cancer Cousin         smoker   Leukemia Cousin 3   Arthritis Other    Coronary artery disease Other    Hypertension Other    Stroke Other    Macular degeneration Other     Current Outpatient Medications on File Prior  to Visit  Medication Sig Dispense Refill   Ascorbic Acid (VITAMIN C) 500 MG tablet Take 500 mg by mouth 2 (two) times daily.     beta carotene w/minerals (OCUVITE) tablet      Calcium Carb-Cholecalciferol (CALCIUM 500 + D) 500-5 MG-MCG TABS      Calcium Carb-Cholecalciferol 600-800 MG-UNIT TABS Take 1 tablet by mouth 2 (two) times daily.      cetirizine (ZYRTEC) 10 MG tablet Take 10 mg by mouth daily as needed (seasonal allergies).     clonazePAM (KLONOPIN) 2 MG tablet TAKE 1 TABLET(2 MG) BY MOUTH TWICE DAILY AS NEEDED FOR ANXIETY 180 tablet 1   COVID-19 mRNA vaccine 2023-2024 (COMIRNATY) syringe Inject into the muscle. 0.3 mL 0   COVID-19 mRNA vaccine, Pfizer, (COMIRNATY) syringe Inject into the muscle. 0.3 mL 0   cyclobenzaprine (FLEXERIL) 10 MG tablet TAKE 1 TABLET BY MOUTH THREE TIMES DAILY AS NEEDED FOR MUSCLE SPASMS 90 tablet 5   dorzolamide-timolol (COSOPT) 22.3-6.8 MG/ML ophthalmic solution Place 1 drop into both eyes 2 (two) times daily. 10 mL    fish oil-omega-3 fatty acids 1000 MG capsule Take 1 g by mouth at bedtime.     fluticasone (FLONASE) 50 MCG/ACT nasal spray SHAKE LIQUID AND USE 2 SPRAYS IN EACH NOSTRIL DAILY 48 g 3   ketorolac (ACULAR) 0.4 % SOLN Place 1 drop into the left eye 4 (four) times daily.     latanoprost (XALATAN) 0.005 % ophthalmic solution Place 1 drop into both eyes at bedtime.     Multiple Vitamins-Minerals (PRESERVISION AREDS 2 PO) Take 1 capsule by mouth 2 (two) times daily.      ondansetron (ZOFRAN) 4 MG tablet Take 1 tablet (4 mg total) by mouth every 8 (eight) hours as needed for nausea or vomiting. 20 tablet 0   RSV vaccine recomb adjuvanted (AREXVY) 120 MCG/0.5ML injection Inject into the muscle. 0.5 mL 0   solifenacin (VESICARE) 10 MG tablet TAKE 1  TABLET(10 MG) BY MOUTH DAILY 90 tablet 0   No current facility-administered medications on file prior to visit.    Allergies  Allergen Reactions   Amoxicillin-Pot Clavulanate Nausea And Vomiting   Fosamax [Alendronate Sodium]     Extreme cramping in legs & feet       Physical Exam Vitals requested from patient and listed below if patient had equipment and was able to obtain at home for this virtual visit: There were no vitals filed for this visit. Estimated body mass index is 28.71 kg/m as calculated from the following:   Height as of 11/29/22: 5\' 5"  (1.651 m).   Weight as of 11/29/22: 172 lb 8 oz (78.2 kg).  EKG (optional): deferred due to virtual visit  GENERAL: alert, oriented, no acute distress detected, full vision exam deferred due to pandemic and/or virtual encounter  PSYCH/NEURO: pleasant and cooperative, no obvious depression or anxiety, speech and thought processing grossly intact, Cognitive function grossly intact  Flowsheet Row Office Visit from 08/09/2023 in [facility]  PHQ-9 Total Score 2           08/09/2023    3:48 PM 05/17/2022    2:20 PM 05/12/2021    1:09 PM 05/07/2020    9:31 AM 05/06/2019    9:28 AM  Depression screen PHQ 2/9  Decreased Interest 0 0 0 0 0  Down, Depressed, Hopeless 0 0 0 0 0  PHQ - 2 Score 0 0 0 0 0  Altered sleeping 0  0   Tired, decreased energy 1   0   Change in appetite 0   0   Feeling bad or failure about yourself  0   0   Trouble concentrating 1   0   Moving slowly or fidgety/restless 0   0   Suicidal thoughts 0   0   PHQ-9 Score 2   0   Difficult doing work/chores Not difficult at all   Not difficult at all        05/12/2021    1:11 PM 11/03/2021    4:00 PM 04/22/2022   11:20 AM 05/17/2022    2:22 PM 08/09/2023    3:53 PM  Fall Risk  Falls in the past year? 0   0 1  Was there an injury with Fall? 0   0 1  Fall Risk Category Calculator 0   0 2  Fall Risk Category (Retired) Low       (RETIRED) Patient Fall Risk Level Low fall risk Low fall risk Low fall risk    Patient at Risk for Falls Due to    No Fall Risks No Fall Risks  Fall risk Follow up    Falls prevention discussed Falls evaluation completed   Tripped over a paver. Saw orthopedist after. No broken bones. Also sees ortho for IT band issues, bursitis and OA  SUMMARY AND PLAN:  Encounter for Medicare annual wellness exam   Discussed applicable health maintenance/preventive health measures and advised and referred or ordered per patient preferences: -discussed vaccines due and she plans to check to see if she had Health Maintenance  Topic Date Due   Zoster Vaccines- Shingrix (1 of 2) 05/08/1964   Pneumonia Vaccine 35+ Years old (2 of 2 - PPSV23) 07/29/2020   INFLUENZA VACCINE  11/30/2023   Medicare Annual Wellness (AWV)  08/08/2024   DTaP/Tdap/Td (3 - Td or Tdap) 10/31/2025   DEXA SCAN  Completed   Hepatitis C Screening  Completed   HPV VACCINES  Aged Out   Meningococcal B Vaccine  Aged Out   Colonoscopy  Discontinued   COVID-19 Vaccine  Discontinued      Education and counseling on the following was provided based on the above review of health and a plan/checklist for the patient, along with additional information discussed, was provided for the patient in the patient instructions :  -Advised on importance of completing advanced directives, discussed options for completing and provided information in patient instructions as well -Provided counseling and plan for increased risk of falling if applicable per above screening. She has done PT and is seeing ortho.  Provided safe balance exercises that can be done at home to improve balance and discussed exercise guidelines for adults with include balance exercises at least 3 days per week.  -Advised and counseled on a healthy lifestyle - including the importance of a healthy diet, regular physical activity, social connections and stress management. -Reviewed  patient's current diet. Advised and counseled on a whole foods based healthy diet. A summary of a healthy diet was provided in the Patient Instructions.  -reviewed patient's current physical activity level and discussed exercise guidelines for adults. Discussed community resources and ideas for safe exercise at home to assist in meeting exercise guideline recommendations in a safe and healthy way.  -Advise yearly dental visits at minimum and regular eye exams -Advised and counseled on alcohol safe limits, risks  Follow up: see patient instructions     Patient Instructions  I really enjoyed  getting to talk with you today! I am available on Tuesdays and Thursdays for virtual visits if you have any questions or concerns, or if I can be of any further assistance.   CHECKLIST FROM ANNUAL WELLNESS VISIT:  -Follow up (please call to schedule if not scheduled after visit):   -yearly for annual wellness visit with primary care office  Here is a list of your preventive care/health maintenance measures and the plan for each if any are due:  PLAN For any measures below that may be due:  -can get the vaccines at the pharmacy  Health Maintenance  Topic Date Due   Zoster Vaccines- Shingrix (1 of 2) 05/08/1966   Pneumonia Vaccine 14+ Years old (2 of 2 - PPSV23) 07/29/2020   INFLUENZA VACCINE  11/30/2023   Medicare Annual Wellness (AWV)  08/08/2024   DTaP/Tdap/Td (3 - Td or Tdap) 10/31/2025   DEXA SCAN  Completed   Hepatitis C Screening  Completed   HPV VACCINES  Aged Out   Meningococcal B Vaccine  Aged Out   Colonoscopy  Discontinued   COVID-19 Vaccine  Discontinued    -See a dentist at least yearly  -Get your eyes checked and then per your eye specialist's recommendations  -Other issues addressed today:   -I have included below further information regarding a healthy whole foods based diet, physical activity guidelines for adults, stress management and opportunities for social  connections. I hope you find this information useful.   -----------------------------------------------------------------------------------------------------------------------------------------------------------------------------------------------------------------------------------------------------------    NUTRITION: -eat real food: lots of colorful vegetables (half the plate) and fruits -5-7 servings of vegetables and fruits per day (fresh or steamed is best), exp. 2 servings of vegetables with lunch and dinner and 2 servings of fruit per day. Berries and greens such as kale and collards are great choices.  -consume on a regular basis:  fresh fruits, fresh veggies, fish, nuts, seeds, healthy oils (such as olive oil, avocado oil), whole grains (make sure for bread/pasta/crackers/etc., that the first ingredient on label contains the word "whole"), legumes. -can eat small amounts of dairy and lean meat (no larger than the palm of your hand), but avoid processed meats such as ham, bacon, lunch meat, etc. -drink water -try to avoid fast food and pre-packaged foods, processed meat, ultra processed foods/beverages (donuts, candy, etc.) -most experts advise limiting sodium to < 2300mg  per day, should limit further is any chronic conditions such as high blood pressure, heart disease, diabetes, etc. The American Heart Association advised that < 1500mg  is is ideal -try to avoid foods/beverages that contain any ingredients with names you do not recognize  -try to avoid foods/beverages  with added sugar or sweeteners/sweets  -try to avoid sweet drinks (including diet drinks): soda, juice, Gatorade, sweet tea, power drinks, diet drinks -try to avoid white rice, white bread, pasta (unless whole grain)  EXERCISE GUIDELINES FOR ADULTS: -if you wish to increase your physical activity, do so gradually and with the approval of your doctor -STOP and seek medical care immediately if you have any chest pain, chest  discomfort or trouble breathing when starting or increasing exercise  -move and stretch your body, legs, feet and arms when sitting for long periods -Physical activity guidelines for optimal health in adults: -get at least 150 minutes per week of moderate exercise (can talk, but not sing); this is about 20-30 minutes of sustained activity 5-7 days per week or two 10-15 minute episodes of sustained activity 5-7 days per week -do some muscle building/resistance training/strength  training at least 2 days per week  -balance exercises 3+ days per week:   Stand somewhere where you have something sturdy to hold onto if you lose balance    1) lift up on toes, then back down, start with 5x per day and work up to 20x   2) stand and lift one leg straight out to the side so that foot is a few inches of the floor, start with 5x each side and work up to 20x each side   3) stand on one foot, start with 5 seconds each side and work up to 20 seconds on each side  If you need ideas or help with getting more active:  -Silver sneakers https://tools.silversneakers.com  -Walk with a Doc: http://www.duncan-williams.com/  -try to include resistance (weight lifting/strength building) and balance exercises twice per week: or the following link for ideas: http://castillo-powell.com/  BuyDucts.dk  STRESS MANAGEMENT: -can try meditating, or just sitting quietly with deep breathing while intentionally relaxing all parts of your body for 5 minutes daily -if you need further help with stress, anxiety or depression please follow up with your primary doctor or contact the wonderful folks at [facility]: [phone]  SOCIAL CONNECTIONS: -options in [place] if you wish to engage in more social and exercise related activities:  -Silver sneakers https://tools.silversneakers.com  -Walk with a  Doc: http://www.duncan-williams.com/  -Check out the [facility] Active Adults 50+ section on the [place] [employer] (hiking clubs, book clubs, cards and games, chess, exercise classes, aquatic classes and much more) - see the website for details: https://www.[place].gov/departments/parks-recreation/active-adults50  -YouTube has lots of exercise videos for different ages and abilities as well  -[facility] (a variety of indoor and outdoor inperson activities for adults). [phone]. [address]  -Virtual Online Classes (a variety of topics): see seniorplanet.org or call [phone]  -consider volunteering at a school, hospice center, church, senior center or elsewhere    ADVANCED HEALTHCARE DIRECTIVES:  [place] Advanced Directives assistance:   ExpressWeek.com.cy  Everyone should have advanced health care directives in place. This is so that you get the care you want, should you ever be in a situation where you are unable to make your own medical decisions.   From the [place] Advanced Directive Website: "Advance Health Care Directives are legal documents in which you give written instructions about your health care if, in the future, you cannot speak for yourself.   A health care power of attorney allows you to name a person you trust to make your health care decisions if you cannot make them yourself. A declaration of a desire for a natural death (or living will) is document, which states that you desire not to have your life prolonged by extraordinary measures if you have a terminal or incurable illness or if you are in a vegetative state. An advance instruction for mental health treatment makes a declaration of instructions, information and preferences regarding your mental health treatment. It also states that you are aware that the advance instruction authorizes a mental health treatment provider to act  according to your wishes. It may also outline your consent or refusal of mental health treatment. A declaration of an anatomical gift allows anyone over the age of 59 to make a gift by will, organ donor card or other document."   Please see the following website or an elder law attorney for forms, FAQs and for completion of advanced directives: [place] TEFL teacher Health Care Directives Advance Health Care  Directives (http://guzman.com/)  Or copy and paste the following to your web browser: [email or web address]          Terressa Koyanagi, DO

## 2023-08-09 NOTE — Progress Notes (Signed)
 Patient unable to obtain vital signs due to telehealth visit

## 2023-08-24 ENCOUNTER — Other Ambulatory Visit: Payer: Self-pay | Admitting: Family Medicine

## 2023-08-24 NOTE — Telephone Encounter (Signed)
 Copied from CRM [id number]. Topic: Clinical - Medication Question >> Aug 24, 2023  1:13 PM [place] J wrote: Reason for CRM: Patient calling back regarding solifenacin  (VESICARE ) 10 MG tablet. She wanted to notify the clinic that she will be completely out of medication on Monday and would like to see if we could expedite this refill process since her pharmacy submitted her request late.

## 2023-08-28 ENCOUNTER — Ambulatory Visit (INDEPENDENT_AMBULATORY_CARE_PROVIDER_SITE_OTHER): Admitting: Family Medicine

## 2023-08-28 ENCOUNTER — Encounter: Payer: Self-pay | Admitting: Family Medicine

## 2023-08-28 VITALS — BP 128/76 | HR 72 | Temp 98.5°F | Wt 184.0 lb

## 2023-08-28 DIAGNOSIS — E785 Hyperlipidemia, unspecified: Secondary | ICD-10-CM

## 2023-08-28 DIAGNOSIS — N3281 Overactive bladder: Secondary | ICD-10-CM | POA: Diagnosis not present

## 2023-08-28 DIAGNOSIS — I10 Essential (primary) hypertension: Secondary | ICD-10-CM | POA: Diagnosis not present

## 2023-08-28 DIAGNOSIS — Z171 Estrogen receptor negative status [ER-]: Secondary | ICD-10-CM | POA: Diagnosis not present

## 2023-08-28 DIAGNOSIS — R739 Hyperglycemia, unspecified: Secondary | ICD-10-CM | POA: Diagnosis not present

## 2023-08-28 DIAGNOSIS — I420 Dilated cardiomyopathy: Secondary | ICD-10-CM

## 2023-08-28 DIAGNOSIS — C50411 Malignant neoplasm of upper-outer quadrant of right female breast: Secondary | ICD-10-CM

## 2023-08-28 DIAGNOSIS — F411 Generalized anxiety disorder: Secondary | ICD-10-CM

## 2023-08-28 LAB — CBC WITH DIFFERENTIAL/PLATELET
Basophils Absolute: 0 10*3/uL (ref 0.0–0.1)
Basophils Relative: 0.6 % (ref 0.0–3.0)
Eosinophils Absolute: 0.3 10*3/uL (ref 0.0–0.7)
Eosinophils Relative: 4.5 % (ref 0.0–5.0)
HCT: 42 % (ref 36.0–46.0)
Hemoglobin: 14.1 g/dL (ref 12.0–15.0)
Lymphocytes Relative: 30.1 % (ref 12.0–46.0)
Lymphs Abs: 1.7 10*3/uL (ref 0.7–4.0)
MCHC: 33.5 g/dL (ref 30.0–36.0)
MCV: 89.8 fl (ref 78.0–100.0)
Monocytes Absolute: 0.6 10*3/uL (ref 0.1–1.0)
Monocytes Relative: 11.1 % (ref 3.0–12.0)
Neutro Abs: 3.1 10*3/uL (ref 1.4–7.7)
Neutrophils Relative %: 53.7 % (ref 43.0–77.0)
Platelets: 248 10*3/uL (ref 150.0–400.0)
RBC: 4.67 Mil/uL (ref 3.87–5.11)
RDW: 13.9 % (ref 11.5–15.5)
WBC: 5.7 10*3/uL (ref 4.0–10.5)

## 2023-08-28 LAB — BASIC METABOLIC PANEL WITH GFR
BUN: 12 mg/dL (ref 6–23)
CO2: 25 meq/L (ref 19–32)
Calcium: 9.4 mg/dL (ref 8.4–10.5)
Chloride: 99 meq/L (ref 96–112)
Creatinine, Ser: 0.61 mg/dL (ref 0.40–1.20)
GFR: 86.91 mL/min (ref >60.00)
Glucose, Bld: 92 mg/dL (ref 70–99)
Potassium: 3.8 meq/L (ref 3.5–5.1)
Sodium: 130 meq/L — ABNORMAL LOW (ref 135–145)

## 2023-08-28 LAB — HEPATIC FUNCTION PANEL
ALT: 12 U/L (ref 0–35)
AST: 20 U/L (ref 0–37)
Albumin: 4.2 g/dL (ref 3.5–5.2)
Alkaline Phosphatase: 61 U/L (ref 39–117)
Bilirubin, Direct: 0.2 mg/dL (ref 0.0–0.3)
Total Bilirubin: 0.8 mg/dL (ref 0.2–1.2)
Total Protein: 7.3 g/dL (ref 6.0–8.3)

## 2023-08-28 LAB — LIPID PANEL
Cholesterol: 214 mg/dL — ABNORMAL HIGH (ref 0–200)
HDL: 81.2 mg/dL (ref >39.00)
LDL Cholesterol: 117 mg/dL — ABNORMAL HIGH (ref 0–99)
NonHDL: 132.95
Total CHOL/HDL Ratio: 3
Triglycerides: 81 mg/dL (ref 0.0–149.0)
VLDL: 16.2 mg/dL (ref 0.0–40.0)

## 2023-08-28 LAB — HEMOGLOBIN A1C: Hgb A1c MFr Bld: 5.6 % (ref 4.6–6.5)

## 2023-08-28 LAB — TSH: TSH: 2.52 u[IU]/mL (ref 0.35–5.50)

## 2023-08-28 MED ORDER — FLUTICASONE PROPIONATE 50 MCG/ACT NA SUSP: NASAL | 5 refills | Status: AC

## 2023-08-28 MED ORDER — SOLIFENACIN SUCCINATE 10 MG PO TABS: 10.0000 mg | ORAL_TABLET | Freq: Every day | ORAL | 3 refills | Status: DC

## 2023-08-28 MED ORDER — CYCLOBENZAPRINE HCL 10 MG PO TABS: 10.0000 mg | ORAL_TABLET | Freq: Three times a day (TID) | ORAL | 5 refills | Status: AC | PRN

## 2023-08-28 NOTE — Progress Notes (Signed)
   Subjective:    Patient ID: Stephanie Olsen, female    DOB: 09/24/1945, 78 y.o.   MRN: [id number]  HPI Here to follow up on issues. She feels well in general except for chronic right hip pain. She sees Dr. Carlye Child for this, and she is currently going to PT for this. She sees Dr. Fernanda Howell to follow up her breast cancer. She has mild neuropathy in both feet which is presumably the result of chemotherapy. This causes her to feel numbness and tingling, but not much pain. She sees Dr. Rudine Cos for ophthalmologic care. She has glaucoma as well as macular degeneration. Her OAB is well controlled with Solifenacin . She needs some medication refills today and she would like to have some lab tests.    Review of Systems  Constitutional: Negative.   Eyes:  Positive for visual disturbance.  Respiratory: Negative.    Cardiovascular: Negative.   Gastrointestinal: Negative.   Genitourinary: Negative.   Musculoskeletal:  Positive for arthralgias.  Neurological:  Positive for numbness. Negative for weakness.       Objective:   Physical Exam Constitutional:      Appearance: Normal appearance.  Cardiovascular:     Rate and Rhythm: Normal rate and regular rhythm.     Pulses: Normal pulses.     Heart sounds: Normal heart sounds.  Pulmonary:     Effort: Pulmonary effort is normal.     Breath sounds: Normal breath sounds.  Musculoskeletal:     Right lower leg: No edema.     Left lower leg: No edema.  Neurological:     Mental Status: She is alert and oriented to person, place, and time. Mental status is at baseline.           Assessment & Plan:  Her HTN is stable. She will follow up with Dr. Gudina for the breast cancer. She will follow up with Dr. Roslynn Coombes for her eye issues. She will follow up with Dr. Carlye Child for the hip pain. Her OAB is stable. We will get labs today to check CBC, etc. We spent a total of ( 35 ) minutes reviewing records and discussing these issues.  Corita Diego, MD

## 2023-09-05 DIAGNOSIS — H353221 Exudative age-related macular degeneration, left eye, with active choroidal neovascularization: Secondary | ICD-10-CM | POA: Diagnosis not present

## 2023-09-11 ENCOUNTER — Ambulatory Visit: Payer: Self-pay

## 2023-09-11 DIAGNOSIS — H401132 Primary open-angle glaucoma, bilateral, moderate stage: Secondary | ICD-10-CM | POA: Diagnosis not present

## 2023-09-11 DIAGNOSIS — H04123 Dry eye syndrome of bilateral lacrimal glands: Secondary | ICD-10-CM | POA: Diagnosis not present

## 2023-09-11 DIAGNOSIS — Z961 Presence of intraocular lens: Secondary | ICD-10-CM | POA: Diagnosis not present

## 2023-09-11 DIAGNOSIS — H353221 Exudative age-related macular degeneration, left eye, with active choroidal neovascularization: Secondary | ICD-10-CM | POA: Diagnosis not present

## 2023-09-11 DIAGNOSIS — H5211 Myopia, right eye: Secondary | ICD-10-CM | POA: Diagnosis not present

## 2023-09-11 DIAGNOSIS — H524 Presbyopia: Secondary | ICD-10-CM | POA: Diagnosis not present

## 2023-09-11 DIAGNOSIS — H52202 Unspecified astigmatism, left eye: Secondary | ICD-10-CM | POA: Diagnosis not present

## 2023-09-13 ENCOUNTER — Telehealth: Payer: Self-pay | Admitting: Genetic Counselor

## 2023-09-13 NOTE — Telephone Encounter (Signed)
 Called the patient to let her know that we need an additional blood sample to complete the testing.  She reports that she recently had been told that her sodium level was low and that she should drink less water.  I told her I was not aware of this being the issue for her current testing.  We will set up another appointment on Monday May 19 @ 1015 AM..

## 2023-09-17 ENCOUNTER — Telehealth: Payer: Self-pay | Admitting: Genetic Counselor

## 2023-09-17 ENCOUNTER — Inpatient Hospital Stay: Attending: Genetic Counselor

## 2023-09-17 DIAGNOSIS — Z171 Estrogen receptor negative status [ER-]: Secondary | ICD-10-CM | POA: Diagnosis not present

## 2023-09-17 DIAGNOSIS — C50411 Malignant neoplasm of upper-outer quadrant of right female breast: Secondary | ICD-10-CM | POA: Diagnosis not present

## 2023-09-17 LAB — GENETIC SCREENING ORDER

## 2023-09-17 NOTE — Telephone Encounter (Signed)
 Patient called to confirm that the testing she will have drawn today will include the BRCA genes.  I called back to confirm that we have ordered testing that will include the current breast cancer genes, including BRCA1 and BRCA2.  The patient will have blood drawn this morning.

## 2023-09-27 DIAGNOSIS — H353112 Nonexudative age-related macular degeneration, right eye, intermediate dry stage: Secondary | ICD-10-CM | POA: Diagnosis not present

## 2023-09-27 DIAGNOSIS — H353221 Exudative age-related macular degeneration, left eye, with active choroidal neovascularization: Secondary | ICD-10-CM | POA: Diagnosis not present

## 2023-09-27 DIAGNOSIS — H401132 Primary open-angle glaucoma, bilateral, moderate stage: Secondary | ICD-10-CM | POA: Diagnosis not present

## 2023-09-27 DIAGNOSIS — H43392 Other vitreous opacities, left eye: Secondary | ICD-10-CM | POA: Diagnosis not present

## 2023-09-27 DIAGNOSIS — H35363 Drusen (degenerative) of macula, bilateral: Secondary | ICD-10-CM | POA: Diagnosis not present

## 2023-10-08 ENCOUNTER — Ambulatory Visit: Payer: Self-pay | Admitting: Genetic Counselor

## 2023-10-08 ENCOUNTER — Encounter: Payer: Self-pay | Admitting: Genetic Counselor

## 2023-10-08 ENCOUNTER — Telehealth: Payer: Self-pay | Admitting: Genetic Counselor

## 2023-10-08 DIAGNOSIS — Z1379 Encounter for other screening for genetic and chromosomal anomalies: Secondary | ICD-10-CM

## 2023-10-08 NOTE — Telephone Encounter (Signed)
Revealed negative genetic testing.  Discussed that we do not know why she has breast cancer or why there is cancer in the family. It could be due to a different gene that we are not testing, or maybe our current technology may not be able to pick something up.  It will be important for her to keep in contact with genetics to keep up with whether additional testing may be needed. 

## 2023-10-08 NOTE — Progress Notes (Signed)
 HPI:  Stephanie Olsen was previously seen in the [facility] due to a personal and family history of cancer and concerns regarding a hereditary predisposition to cancer. Please refer to our prior cancer genetics clinic note for more information regarding our discussion, assessment and recommendations, at the time. Stephanie Olsen recent genetic test results were disclosed to her, as were recommendations warranted by these results. These results and recommendations are discussed in more detail below.  CANCER HISTORY:  Oncology History  Breast cancer of upper-outer quadrant of right female breast (HCC)  03/18/2014 Mammogram   Right breast: suspicious mass 10:00 position measuring 4.9 x 3 x 5 cm, 2 lower right axillary lymph nodes mildly thickened cortices   03/23/2014 Initial Biopsy   Right breast needle biopsy 9:00: Invasive ductal carcinoma grade 3, ER- (0%), PR- (0%), HER-2 negative (ratio 1.3), Ki67 95%   03/31/2014 Breast MRI   Right breast mass 9:00 upper outer quadrant with contiguous skin involvement by 5.7 cm, right axillary lymph nodes normal in size   04/01/2014 Clinical Stage   Stage IIB: T2 N0   04/10/2014 - 08/26/2014 Neo-Adjuvant Chemotherapy   Dose dense doxorubicin  and cyclophosphamide  4 cycles followed by weekly paclitaxel  12. After week 1, paclitaxel  changed to nab-paclitaxel     08/28/2014 Breast MRI   Decrease in the size of the right breast mass from 5.7 cm to 8 mm, skin enhancement is no longer seen, decreased in size of axillary lymph nodes, excellent response to chemotherapy   09/29/2014 Definitive Surgery   Right Lumpectomy / SLNB ([facility]): pathologic CR, 2 LN removed and negative for malignancy (0/2 LN)   11/11/2014 - 12/18/2014 Radiation Therapy   Adjuvant RT Ike Malady): Right breast 50.4 Gy over 28 fractions   02/24/2015 Survivorship   Survivorship care plan completed and mailed to patient in lieu of in person visit.   10/08/2023 Genetic Testing    Negative genetic testing on the CancerNext-Expanded+RNAinsight panel.  The report date is October 08, 2023.  The CancerNext-Expanded gene panel offered by [facility] and includes sequencing, rearrangement, and RNA analysis for the following 77 genes: AIP, ALK, APC, ATM, BAP1, BARD1, BMPR1A, BRCA1, BRCA2, BRIP1, CDC73, CDH1, CDK4, CDKN1B, CDKN2A, CEBPA, CHEK2, CTNNA1, DDX41, DICER1, ETV6, FH, FLCN, GATA2, LZTR1, MAX, MBD4, MEN1, MET, MLH1, MSH2, MSH3, MSH6, MUTYH, NF1, NF2, NTHL1, PALB2, PHOX2B, PMS2, POT1, PRKAR1A, PTCH1, PTEN, RAD51C, RAD51D, RB1, RET, RPS20, RUNX1, SDHA, SDHAF2, SDHB, SDHC, SDHD, SMAD4, SMARCA4, SMARCB1, SMARCE1, STK11, SUFU, TMEM127, TP53, TSC1, TSC2, VHL, and WT1 (sequencing and deletion/duplication); AXIN2, CTNNA1, DDX41, EGFR, HOXB13, KIT, MBD4, MITF, MSH3, PDGFRA, POLD1 and POLE (sequencing only); EPCAM and GREM1 (deletion/duplication only). RNA data is routinely analyzed for use in variant interpretation for all genes.      FAMILY HISTORY:  We obtained a detailed, 4-generation family history.  Significant diagnoses are listed below: Family History  Problem Relation Age of Onset   Lung cancer Maternal Uncle        heavy smoker   Colon cancer Paternal Grandmother 44   Lung cancer Cousin        smoker   Leukemia Cousin 3   Arthritis Other    Coronary artery disease Other    Hypertension Other    Stroke Other    Macular degeneration Other      Stephanie Olsen is an only child, but her mother had four sisters and two brothers.  One brother, who was a smoker had lung cancer, but all others died of heart disease.  Her father had two brothers and two sisters, none of whom had cancer.     Patient's maternal ancestors are of [place] descent, and paternal ancestors are of [place] descent. There is no reported Ashkenazi Jewish ancestry. There is no known consanguinity  GENETIC TEST RESULTS: Genetic testing reported out on October 08, 2023 through the  CancerNext-Expanded+RNAinsight cancer panel found no pathogenic mutations. The CancerNext-Expanded gene panel offered by [facility] and includes sequencing, rearrangement, and RNA analysis for the following 77 genes: AIP, ALK, APC, ATM, BAP1, BARD1, BMPR1A, BRCA1, BRCA2, BRIP1, CDC73, CDH1, CDK4, CDKN1B, CDKN2A, CEBPA, CHEK2, CTNNA1, DDX41, DICER1, ETV6, FH, FLCN, GATA2, LZTR1, MAX, MBD4, MEN1, MET, MLH1, MSH2, MSH3, MSH6, MUTYH, NF1, NF2, NTHL1, PALB2, PHOX2B, PMS2, POT1, PRKAR1A, PTCH1, PTEN, RAD51C, RAD51D, RB1, RET, RPS20, RUNX1, SDHA, SDHAF2, SDHB, SDHC, SDHD, SMAD4, SMARCA4, SMARCB1, SMARCE1, STK11, SUFU, TMEM127, TP53, TSC1, TSC2, VHL, and WT1 (sequencing and deletion/duplication); AXIN2, CTNNA1, DDX41, EGFR, HOXB13, KIT, MBD4, MITF, MSH3, PDGFRA, POLD1 and POLE (sequencing only); EPCAM and GREM1 (deletion/duplication only). RNA data is routinely analyzed for use in variant interpretation for all genes. The test report has been scanned into EPIC and is located under the Molecular Pathology section of the Results Review tab.  A portion of the result report is included below for reference.     We discussed with Stephanie Olsen that because current genetic testing is not perfect, it is possible there may be a gene mutation in one of these genes that current testing cannot detect, but that chance is small.  We also discussed, that there could be another gene that has not yet been discovered, or that we have not yet tested, that is responsible for the cancer diagnoses in the family. It is also possible there is a hereditary cause for the cancer in the family that Stephanie Olsen did not inherit and therefore was not identified in her testing.  Therefore, it is important to remain in touch with cancer genetics in the future so that we can continue to offer Stephanie Olsen the most up to date genetic testing.   ADDITIONAL GENETIC TESTING: We discussed with Stephanie Olsen that her genetic testing was fairly  extensive.  If there are genes identified to increase cancer risk that can be analyzed in the future, we would be happy to discuss and coordinate this testing at that time.    CANCER SCREENING RECOMMENDATIONS: Ms. Fariss test result is considered negative (normal).  This means that we have not identified a hereditary cause for her personal and family history of cancer at this time. Most cancers happen by chance and this negative test suggests that her personal and family history of cancer may fall into this category.    Possible reasons for Ms. Monrreal's negative genetic test include:  1. There may be a gene mutation in one of these genes that current testing methods cannot detect but that chance is small.  2. There could be another gene that has not yet been discovered, or that we have not yet tested, that is responsible for the cancer diagnoses in the family.  3.  There may be no hereditary risk for cancer in the family. The cancers in Ms. Slayden and/or her family may be sporadic/familial or due to other genetic and environmental factors. 4. It is also possible there is a hereditary cause for the cancer in the family that Ms. Genson did not inherit.  Therefore, it is recommended she continue to follow the cancer management and screening guidelines provided  by her oncology and primary healthcare provider. An individual's cancer risk and medical management are not determined by genetic test results alone. Overall cancer risk assessment incorporates additional factors, including personal medical history, family history, and any available genetic information that may result in a personalized plan for cancer prevention and surveillance  RECOMMENDATIONS FOR FAMILY MEMBERS:   Since she did not inherit a identifiable mutation in a cancer predisposition gene included on this panel, her children could not have inherited a known mutation from her in one of these genes. Individuals in this family  might be at some increased risk of developing cancer, over the general population risk, simply due to the family history of cancer.  We recommended women in this family have a yearly mammogram beginning at age 2, or 2 years younger than the earliest onset of cancer, an annual clinical breast exam, and perform monthly breast self-exams. Women in this family should also have a gynecological exam as recommended by their primary provider. All family members should be referred for colonoscopy starting at age 41, or 53 years younger than the earliest onset of cancer.  FOLLOW-UP: Lastly, we discussed with Ms. Mehlman that cancer genetics is a rapidly advancing field and it is possible that new genetic tests will be appropriate for her and/or her family members in the future. We encouraged her to remain in contact with cancer genetics on an annual basis so we can update her personal and family histories and let her know of advances in cancer genetics that may benefit this family.   Our contact number was provided. Ms. Ginsberg questions were answered to her satisfaction, and she knows she is welcome to call us  at anytime with additional questions or concerns.   Marijo Shove, MS, [facility] Licensed, Certified Genetic Counselor [email or web address]

## 2023-10-23 ENCOUNTER — Other Ambulatory Visit: Payer: Self-pay | Admitting: Family Medicine

## 2023-10-24 DIAGNOSIS — H353221 Exudative age-related macular degeneration, left eye, with active choroidal neovascularization: Secondary | ICD-10-CM | POA: Diagnosis not present

## 2023-11-13 ENCOUNTER — Telehealth: Payer: Self-pay | Admitting: Hematology and Oncology

## 2023-11-13 NOTE — Telephone Encounter (Signed)
 Called to reschedule patient appointment to due provider pal  request. I talked  to patient and they are aware of the changes that was made to the upcoming appointment

## 2023-11-22 DIAGNOSIS — Z01419 Encounter for gynecological examination (general) (routine) without abnormal findings: Secondary | ICD-10-CM | POA: Diagnosis not present

## 2023-11-27 DIAGNOSIS — H353221 Exudative age-related macular degeneration, left eye, with active choroidal neovascularization: Secondary | ICD-10-CM | POA: Diagnosis not present

## 2023-11-29 ENCOUNTER — Ambulatory Visit: Payer: Medicare Other | Admitting: Hematology and Oncology

## 2023-12-04 DIAGNOSIS — R300 Dysuria: Secondary | ICD-10-CM | POA: Diagnosis not present

## 2023-12-10 NOTE — Assessment & Plan Note (Signed)
 Right breast invasive ductal carcinoma, palpable mass, 5 cm by mammogram and 5.7 cm by MRI grade 3, ER PR HER-2 negative, associated skin dimpling, T3, N0, M0 stage IIB Ki-67 90%   Chemotherapy summary: Neoadjuvant chemotherapy with dose dense Adriamycin  and Cytoxan  04/10/2014. followed by weekly Taxol  x1 changed to Abraxane  11 from 06/17/2014 completed 08/26/2014 Rt Lumpectomy: 09/29/14: Path CR 0/2 LN Completed adjuvant radiation therapy 12/18/2014   Breast Cancer Surveillance: 1. Breast exam 11/29/2022: right breast upper quadrant feels tight in the muscle. No palpable nodularity. 2. Mammograms 07/11/2022: Benign , breast density category C 3. Bone density 12/09/2019: T score -1.3 osteopenia (unchanged from before)   Fatigue: Patient exercises regularly but she feels very tired most often. Echocardiogram 11/14/2018: EF 55 to 60%   She continues to stay active and helps take care of her 80 yr old granddaughter.   RTC in 1 yr for continued surveillance

## 2023-12-11 ENCOUNTER — Inpatient Hospital Stay: Attending: Hematology and Oncology | Admitting: Hematology and Oncology

## 2023-12-11 VITALS — BP 126/59 | HR 79 | Temp 97.9°F | Resp 18 | Ht 65.0 in | Wt 185.8 lb

## 2023-12-11 DIAGNOSIS — Z923 Personal history of irradiation: Secondary | ICD-10-CM | POA: Diagnosis not present

## 2023-12-11 DIAGNOSIS — R5383 Other fatigue: Secondary | ICD-10-CM | POA: Diagnosis not present

## 2023-12-11 DIAGNOSIS — Z171 Estrogen receptor negative status [ER-]: Secondary | ICD-10-CM

## 2023-12-11 DIAGNOSIS — Z08 Encounter for follow-up examination after completed treatment for malignant neoplasm: Secondary | ICD-10-CM | POA: Insufficient documentation

## 2023-12-11 DIAGNOSIS — M8580 Other specified disorders of bone density and structure, unspecified site: Secondary | ICD-10-CM | POA: Diagnosis not present

## 2023-12-11 DIAGNOSIS — Z853 Personal history of malignant neoplasm of breast: Secondary | ICD-10-CM | POA: Insufficient documentation

## 2023-12-11 DIAGNOSIS — C50411 Malignant neoplasm of upper-outer quadrant of right female breast: Secondary | ICD-10-CM

## 2023-12-11 DIAGNOSIS — Z9221 Personal history of antineoplastic chemotherapy: Secondary | ICD-10-CM | POA: Insufficient documentation

## 2023-12-11 NOTE — Progress Notes (Signed)
 Patient Care Team: Johnny Garnette LABOR, MD as PCP - General (Family Medicine) Odean Potts, MD as Consulting Physician (Hematology and Oncology) Jason Charleston, MD (Inactive) as Consulting Physician (Radiation Oncology) Letha Truman ORN, NP (Inactive) as Nurse Practitioner (Nurse Practitioner) Moses Powell Hummer, NP as Nurse Practitioner (Hematology and Oncology)  DIAGNOSIS:  Encounter Diagnosis  Name Primary?   Malignant neoplasm of upper-outer quadrant of right breast in female, estrogen receptor negative (HCC) Yes    SUMMARY OF ONCOLOGIC HISTORY: Oncology History  Breast cancer of upper-outer quadrant of right female breast (HCC)  03/18/2014 Mammogram   Right breast: suspicious mass 10:00 position measuring 4.9 x 3 x 5 cm, 2 lower right axillary lymph nodes mildly thickened cortices   03/23/2014 Initial Biopsy   Right breast needle biopsy 9:00: Invasive ductal carcinoma grade 3, ER- (0%), PR- (0%), HER-2 negative (ratio 1.3), Ki67 95%   03/31/2014 Breast MRI   Right breast mass 9:00 upper outer quadrant with contiguous skin involvement by 5.7 cm, right axillary lymph nodes normal in size   04/01/2014 Clinical Stage   Stage IIB: T2 N0   04/10/2014 - 08/26/2014 Neo-Adjuvant Chemotherapy   Dose dense doxorubicin  and cyclophosphamide  4 cycles followed by weekly paclitaxel  12. After week 1, paclitaxel  changed to nab-paclitaxel     08/28/2014 Breast MRI   Decrease in the size of the right breast mass from 5.7 cm to 8 mm, skin enhancement is no longer seen, decreased in size of axillary lymph nodes, excellent response to chemotherapy   09/29/2014 Definitive Surgery   Right Lumpectomy / SLNB ([facility]): pathologic CR, 2 LN removed and negative for malignancy (0/2 LN)   11/11/2014 - 12/18/2014 Radiation Therapy   Adjuvant RT [facility]): Right breast 50.4 Gy over 28 fractions   02/24/2015 Survivorship   Survivorship care plan completed and mailed to patient in lieu of in person  visit.   10/08/2023 Genetic Testing   Negative genetic testing on the CancerNext-Expanded+RNAinsight panel.  The report date is October 08, 2023.  The CancerNext-Expanded gene panel offered by [facility] and includes sequencing, rearrangement, and RNA analysis for the following 77 genes: AIP, ALK, APC, ATM, BAP1, BARD1, BMPR1A, BRCA1, BRCA2, BRIP1, CDC73, CDH1, CDK4, CDKN1B, CDKN2A, CEBPA, CHEK2, CTNNA1, DDX41, DICER1, ETV6, FH, FLCN, GATA2, LZTR1, MAX, MBD4, MEN1, MET, MLH1, MSH2, MSH3, MSH6, MUTYH, NF1, NF2, NTHL1, PALB2, PHOX2B, PMS2, POT1, PRKAR1A, PTCH1, PTEN, RAD51C, RAD51D, RB1, RET, RPS20, RUNX1, SDHA, SDHAF2, SDHB, SDHC, SDHD, SMAD4, SMARCA4, SMARCB1, SMARCE1, STK11, SUFU, TMEM127, TP53, TSC1, TSC2, VHL, and WT1 (sequencing and deletion/duplication); AXIN2, CTNNA1, DDX41, EGFR, HOXB13, KIT, MBD4, MITF, MSH3, PDGFRA, POLD1 and POLE (sequencing only); EPCAM and GREM1 (deletion/duplication only). RNA data is routinely analyzed for use in variant interpretation for all genes.      CHIEF COMPLIANT: Surveillance of breast cancer  HISTORY OF PRESENT ILLNESS:   History of Present Illness Stephanie Olsen is a 78 year old female who presents for a follow-up visit.  She is nearly ten years post-diagnosis of breast cancer, initially diagnosed in 2015. She undergoes regular mammograms at the [facility] on [facility], with the most recent one showing no issues. She prefers diagnostic mammograms due to previous difficulties with standard ones, which often require additional imaging.     ALLERGIES:  is allergic to amoxicillin -pot clavulanate and fosamax [alendronate sodium].  MEDICATIONS:  Current Outpatient Medications  Medication Sig Dispense Refill   Ascorbic Acid (VITAMIN C) 500 MG tablet Take 500 mg by mouth 2 (two) times daily.  Calcium Carb-Cholecalciferol (CALCIUM 500 + D) 500-5 MG-MCG TABS      Calcium Carb-Cholecalciferol 600-800 MG-UNIT TABS Take 1 tablet by mouth 2 (two)  times daily.      cetirizine (ZYRTEC) 10 MG tablet Take 10 mg by mouth daily as needed (seasonal allergies).     clonazePAM  (KLONOPIN ) 2 MG tablet TAKE 1 TABLET(2 MG) BY MOUTH TWICE DAILY AS NEEDED FOR ANXIETY 180 tablet 1   cyclobenzaprine  (FLEXERIL ) 10 MG tablet Take 1 tablet (10 mg total) by mouth 3 (three) times daily as needed for muscle spasms. for muscle spams 90 tablet 5   dorzolamide -timolol  (COSOPT ) 22.3-6.8 MG/ML ophthalmic solution Place 1 drop into both eyes 2 (two) times daily. 10 mL    fish oil-omega-3 fatty acids 1000 MG capsule Take 1 g by mouth at bedtime.     fluticasone  (FLONASE ) 50 MCG/ACT nasal spray SHAKE LIQUID AND USE 2 SPRAYS IN EACH NOSTRIL DAILY 48 g 5   ketorolac  (ACULAR ) 0.4 % SOLN Place 1 drop into the left eye 4 (four) times daily.     latanoprost (XALATAN) 0.005 % ophthalmic solution Place 1 drop into both eyes at bedtime.     Multiple Vitamins-Minerals (PRESERVISION AREDS 2 PO) Take 1 capsule by mouth 2 (two) times daily.      solifenacin  (VESICARE ) 10 MG tablet TAKE 1 TABLET(10 MG) BY MOUTH DAILY 90 tablet 1   No current facility-administered medications for this visit.    PHYSICAL EXAMINATION: ECOG PERFORMANCE STATUS: 1 - Symptomatic but completely ambulatory  Vitals:   12/11/23 0932 12/11/23 0936  BP: (!) 152/62 (!) 126/59  Pulse: 79   Resp: 18   Temp: 97.9 F (36.6 C)   SpO2: 98%    Filed Weights   12/11/23 0932  Weight: 185 lb 12.8 oz (84.3 kg)    Physical Exam   (exam performed in the presence of a chaperone)  LABORATORY DATA:  I have reviewed the data as listed    Latest Ref Rng & Units 08/28/2023    1:48 PM 11/03/2021    3:07 PM 05/28/2020   11:19 AM  CMP  Glucose 70 - 99 mg/dL 92  86  81   BUN 6 - 23 mg/dL 12  10  10    Creatinine 0.40 - 1.20 mg/dL 9.38  9.24  9.31   Sodium 135 - 145 mEq/L 130  130  135   Potassium 3.5 - 5.1 mEq/L 3.8  3.6  4.6   Chloride 96 - 112 mEq/L 99  99  101   CO2 19 - 32 mEq/L 25  25  27    Calcium 8.4 -  10.5 mg/dL 9.4  9.4  89.6   Total Protein 6.0 - 8.3 g/dL 7.3  6.9  7.7   Total Bilirubin 0.2 - 1.2 mg/dL 0.8  0.8  0.8   Alkaline Phos 39 - 117 U/L 61  57  71   AST 0 - 37 U/L 20  18  25    ALT 0 - 35 U/L 12  11  19      Lab Results  Component Value Date   WBC 5.7 08/28/2023   HGB 14.1 08/28/2023   HCT 42.0 08/28/2023   MCV 89.8 08/28/2023   PLT 248.0 08/28/2023   NEUTROABS 3.1 08/28/2023    ASSESSMENT & PLAN:  Breast cancer of upper-outer quadrant of right female breast Right breast invasive ductal carcinoma, palpable mass, 5 cm by mammogram and 5.7 cm by MRI grade 3, ER PR HER-2 negative,  associated skin dimpling, T3, N0, M0 stage IIB Ki-67 90%   Chemotherapy summary: Neoadjuvant chemotherapy with dose dense Adriamycin  and Cytoxan  04/10/2014. followed by weekly Taxol  x1 changed to Abraxane  11 from 06/17/2014 completed 08/26/2014 Rt Lumpectomy: 09/29/14: Path CR 0/2 LN Completed adjuvant radiation therapy 12/18/2014   Breast Cancer Surveillance: 1. Breast exam 11/29/2022: right breast upper quadrant feels tight in the muscle. No palpable nodularity. 2. Mammograms 07/11/2022: Benign , breast density category C 3. Bone density 12/09/2019: T score -1.3 osteopenia (unchanged from before)   Fatigue: Patient exercises regularly but she feels very tired most often. Echocardiogram 11/14/2018: EF 55 to 60%   She continues to stay active and helps take care of her 1 yr old granddaughter. I recommend that she continue with diagnostic mammograms on an annual basis. I ordered the diagnostic mammogram for next year.  If for the following year she needs diagnostic mammograms we are happy to continue with ordering them versus she could discuss this with her primary care physician to order.   RTC on an as-needed basis     Orders Placed This Encounter  Procedures   MM DIAG BREAST TOMO BILATERAL    Standing Status:   Future    Expected Date:   07/14/2024    Expiration Date:   12/10/2024     Reason for Exam (SYMPTOM  OR DIAGNOSIS REQUIRED):   ANnual diagnostic mammograms    Preferred [facility]    Release to patient:   Immediate   The patient has a good understanding of the overall plan. she agrees with it. she will call with any problems that may develop before the next visit here. Total time spent: 30 mins including face to face time and time spent for planning, charting and co-ordination of care   Viinay K Trinadee Verhagen, MD 12/11/23

## 2023-12-17 DIAGNOSIS — R300 Dysuria: Secondary | ICD-10-CM | POA: Diagnosis not present

## 2024-01-15 DIAGNOSIS — H353221 Exudative age-related macular degeneration, left eye, with active choroidal neovascularization: Secondary | ICD-10-CM | POA: Diagnosis not present

## 2024-01-23 DIAGNOSIS — H35342 Macular cyst, hole, or pseudohole, left eye: Secondary | ICD-10-CM | POA: Diagnosis not present

## 2024-01-23 DIAGNOSIS — H353221 Exudative age-related macular degeneration, left eye, with active choroidal neovascularization: Secondary | ICD-10-CM | POA: Diagnosis not present

## 2024-01-23 DIAGNOSIS — H401132 Primary open-angle glaucoma, bilateral, moderate stage: Secondary | ICD-10-CM | POA: Diagnosis not present

## 2024-01-23 DIAGNOSIS — H35373 Puckering of macula, bilateral: Secondary | ICD-10-CM | POA: Diagnosis not present

## 2024-01-23 DIAGNOSIS — H353112 Nonexudative age-related macular degeneration, right eye, intermediate dry stage: Secondary | ICD-10-CM | POA: Diagnosis not present

## 2024-01-23 DIAGNOSIS — H43392 Other vitreous opacities, left eye: Secondary | ICD-10-CM | POA: Diagnosis not present

## 2024-01-23 DIAGNOSIS — H35363 Drusen (degenerative) of macula, bilateral: Secondary | ICD-10-CM | POA: Diagnosis not present

## 2024-01-30 DIAGNOSIS — N760 Acute vaginitis: Secondary | ICD-10-CM | POA: Diagnosis not present

## 2024-02-21 DIAGNOSIS — H401132 Primary open-angle glaucoma, bilateral, moderate stage: Secondary | ICD-10-CM | POA: Diagnosis not present

## 2024-02-21 DIAGNOSIS — H04123 Dry eye syndrome of bilateral lacrimal glands: Secondary | ICD-10-CM | POA: Diagnosis not present

## 2024-02-21 DIAGNOSIS — H0100B Unspecified blepharitis left eye, upper and lower eyelids: Secondary | ICD-10-CM | POA: Diagnosis not present

## 2024-02-21 DIAGNOSIS — H0100A Unspecified blepharitis right eye, upper and lower eyelids: Secondary | ICD-10-CM | POA: Diagnosis not present

## 2024-03-04 DIAGNOSIS — H353221 Exudative age-related macular degeneration, left eye, with active choroidal neovascularization: Secondary | ICD-10-CM | POA: Diagnosis not present

## 2024-04-08 DIAGNOSIS — H353221 Exudative age-related macular degeneration, left eye, with active choroidal neovascularization: Secondary | ICD-10-CM | POA: Diagnosis not present

## 2024-04-26 ENCOUNTER — Other Ambulatory Visit: Payer: Self-pay | Admitting: Family Medicine

## 2024-07-14 ENCOUNTER — Encounter

## 2024-12-10 ENCOUNTER — Ambulatory Visit: Admitting: Hematology and Oncology
